# Patient Record
Sex: Female | Born: 1955 | Race: White | Hispanic: No | Marital: Married | Smoking: Former smoker
Health system: Southern US, Community
[De-identification: ages and names within clinical notes are randomized; demographics above are authoritative.]

## PROBLEM LIST (undated history)

## (undated) DIAGNOSIS — L280 Lichen simplex chronicus: Secondary | ICD-10-CM

## (undated) DIAGNOSIS — M2550 Pain in unspecified joint: Secondary | ICD-10-CM

## (undated) DIAGNOSIS — R1310 Dysphagia, unspecified: Secondary | ICD-10-CM

## (undated) DIAGNOSIS — K5900 Constipation, unspecified: Secondary | ICD-10-CM

## (undated) DIAGNOSIS — F419 Anxiety disorder, unspecified: Secondary | ICD-10-CM

## (undated) DIAGNOSIS — K219 Gastro-esophageal reflux disease without esophagitis: Secondary | ICD-10-CM

## (undated) DIAGNOSIS — C801 Malignant (primary) neoplasm, unspecified: Secondary | ICD-10-CM

## (undated) DIAGNOSIS — T8859XA Other complications of anesthesia, initial encounter: Secondary | ICD-10-CM

## (undated) DIAGNOSIS — B009 Herpesviral infection, unspecified: Secondary | ICD-10-CM

## (undated) DIAGNOSIS — E781 Pure hyperglyceridemia: Secondary | ICD-10-CM

## (undated) DIAGNOSIS — R079 Chest pain, unspecified: Secondary | ICD-10-CM

## (undated) DIAGNOSIS — E7800 Pure hypercholesterolemia, unspecified: Secondary | ICD-10-CM

## (undated) DIAGNOSIS — E559 Vitamin D deficiency, unspecified: Secondary | ICD-10-CM

## (undated) DIAGNOSIS — S62109A Fracture of unspecified carpal bone, unspecified wrist, initial encounter for closed fracture: Secondary | ICD-10-CM

## (undated) DIAGNOSIS — T4145XA Adverse effect of unspecified anesthetic, initial encounter: Secondary | ICD-10-CM

## (undated) DIAGNOSIS — G4730 Sleep apnea, unspecified: Secondary | ICD-10-CM

## (undated) HISTORY — DX: Anxiety disorder, unspecified: F41.9

## (undated) HISTORY — DX: Lichen simplex chronicus: L28.0

## (undated) HISTORY — DX: Sleep apnea, unspecified: G47.30

## (undated) HISTORY — PX: DILATION AND CURETTAGE OF UTERUS: SHX78

## (undated) HISTORY — DX: Dysphagia, unspecified: R13.10

## (undated) HISTORY — DX: Chest pain, unspecified: R07.9

## (undated) HISTORY — DX: Constipation, unspecified: K59.00

## (undated) HISTORY — DX: Pure hyperglyceridemia: E78.1

## (undated) HISTORY — DX: Fracture of unspecified carpal bone, unspecified wrist, initial encounter for closed fracture: S62.109A

## (undated) HISTORY — DX: Vitamin D deficiency, unspecified: E55.9

## (undated) HISTORY — PX: FACIAL COSMETIC SURGERY: SHX629

## (undated) HISTORY — DX: Pain in unspecified joint: M25.50

## (undated) HISTORY — DX: Herpesviral infection, unspecified: B00.9

---

## 1981-08-04 HISTORY — PX: TUBAL LIGATION: SHX77

## 1999-09-11 ENCOUNTER — Other Ambulatory Visit: Admission: RE | Admit: 1999-09-11 | Discharge: 1999-09-11 | Payer: Self-pay | Admitting: Obstetrics and Gynecology

## 2000-11-03 ENCOUNTER — Other Ambulatory Visit: Admission: RE | Admit: 2000-11-03 | Discharge: 2000-11-03 | Payer: Self-pay | Admitting: Obstetrics and Gynecology

## 2001-11-04 ENCOUNTER — Other Ambulatory Visit: Admission: RE | Admit: 2001-11-04 | Discharge: 2001-11-04 | Payer: Self-pay | Admitting: Obstetrics and Gynecology

## 2003-02-09 ENCOUNTER — Other Ambulatory Visit: Admission: RE | Admit: 2003-02-09 | Discharge: 2003-02-09 | Payer: Self-pay | Admitting: Obstetrics and Gynecology

## 2003-03-28 ENCOUNTER — Ambulatory Visit (HOSPITAL_COMMUNITY): Admission: RE | Admit: 2003-03-28 | Discharge: 2003-03-28 | Payer: Self-pay | Admitting: Obstetrics and Gynecology

## 2003-03-28 ENCOUNTER — Encounter: Payer: Self-pay | Admitting: Obstetrics and Gynecology

## 2003-04-19 ENCOUNTER — Encounter: Payer: Self-pay | Admitting: Obstetrics and Gynecology

## 2003-04-19 ENCOUNTER — Encounter: Admission: RE | Admit: 2003-04-19 | Discharge: 2003-04-19 | Payer: Self-pay | Admitting: Obstetrics and Gynecology

## 2004-04-30 ENCOUNTER — Ambulatory Visit (HOSPITAL_COMMUNITY): Admission: RE | Admit: 2004-04-30 | Discharge: 2004-04-30 | Payer: Self-pay | Admitting: Obstetrics and Gynecology

## 2004-09-11 ENCOUNTER — Other Ambulatory Visit: Admission: RE | Admit: 2004-09-11 | Discharge: 2004-09-11 | Payer: Self-pay | Admitting: Obstetrics and Gynecology

## 2005-08-28 ENCOUNTER — Ambulatory Visit (HOSPITAL_COMMUNITY): Admission: RE | Admit: 2005-08-28 | Discharge: 2005-08-28 | Payer: Self-pay | Admitting: Obstetrics and Gynecology

## 2005-12-03 ENCOUNTER — Other Ambulatory Visit: Admission: RE | Admit: 2005-12-03 | Discharge: 2005-12-03 | Payer: Self-pay | Admitting: Obstetrics and Gynecology

## 2006-08-04 DIAGNOSIS — S62109A Fracture of unspecified carpal bone, unspecified wrist, initial encounter for closed fracture: Secondary | ICD-10-CM

## 2006-08-04 HISTORY — DX: Fracture of unspecified carpal bone, unspecified wrist, initial encounter for closed fracture: S62.109A

## 2006-08-04 HISTORY — PX: WRIST FRACTURE SURGERY: SHX121

## 2006-09-02 ENCOUNTER — Ambulatory Visit (HOSPITAL_COMMUNITY): Admission: RE | Admit: 2006-09-02 | Discharge: 2006-09-02 | Payer: Self-pay | Admitting: Obstetrics and Gynecology

## 2006-12-14 ENCOUNTER — Other Ambulatory Visit: Admission: RE | Admit: 2006-12-14 | Discharge: 2006-12-14 | Payer: Self-pay | Admitting: Obstetrics and Gynecology

## 2007-03-10 ENCOUNTER — Ambulatory Visit (HOSPITAL_BASED_OUTPATIENT_CLINIC_OR_DEPARTMENT_OTHER): Admission: RE | Admit: 2007-03-10 | Discharge: 2007-03-11 | Payer: Self-pay | Admitting: *Deleted

## 2007-09-08 ENCOUNTER — Ambulatory Visit (HOSPITAL_COMMUNITY): Admission: RE | Admit: 2007-09-08 | Discharge: 2007-09-08 | Payer: Self-pay | Admitting: Obstetrics and Gynecology

## 2007-12-16 ENCOUNTER — Other Ambulatory Visit: Admission: RE | Admit: 2007-12-16 | Discharge: 2007-12-16 | Payer: Self-pay | Admitting: Obstetrics and Gynecology

## 2008-09-12 ENCOUNTER — Ambulatory Visit (HOSPITAL_COMMUNITY): Admission: RE | Admit: 2008-09-12 | Discharge: 2008-09-12 | Payer: Self-pay | Admitting: Obstetrics and Gynecology

## 2008-12-18 ENCOUNTER — Other Ambulatory Visit: Admission: RE | Admit: 2008-12-18 | Discharge: 2008-12-18 | Payer: Self-pay | Admitting: Obstetrics and Gynecology

## 2009-10-11 ENCOUNTER — Ambulatory Visit (HOSPITAL_COMMUNITY): Admission: RE | Admit: 2009-10-11 | Discharge: 2009-10-11 | Payer: Self-pay | Admitting: Obstetrics and Gynecology

## 2009-11-27 ENCOUNTER — Other Ambulatory Visit: Admission: RE | Admit: 2009-11-27 | Discharge: 2009-11-27 | Payer: Self-pay | Admitting: Obstetrics and Gynecology

## 2010-01-31 ENCOUNTER — Emergency Department (HOSPITAL_COMMUNITY): Admission: EM | Admit: 2010-01-31 | Discharge: 2010-01-31 | Payer: Self-pay | Admitting: Emergency Medicine

## 2010-08-04 HISTORY — PX: HAND SURGERY: SHX662

## 2010-08-25 ENCOUNTER — Encounter: Payer: Self-pay | Admitting: Family Medicine

## 2010-10-20 LAB — CBC
HCT: 42.6 % (ref 36.0–46.0)
Hemoglobin: 14.8 g/dL (ref 12.0–15.0)
MCH: 33.5 pg (ref 26.0–34.0)
MCV: 96.4 fL (ref 78.0–100.0)
RBC: 4.42 MIL/uL (ref 3.87–5.11)
RDW: 12.3 % (ref 11.5–15.5)
WBC: 7.5 10*3/uL (ref 4.0–10.5)

## 2010-10-20 LAB — DIFFERENTIAL
Basophils Relative: 1 % (ref 0–1)
Eosinophils Absolute: 0.1 10*3/uL (ref 0.0–0.7)
Lymphocytes Relative: 30 % (ref 12–46)
Monocytes Relative: 6 % (ref 3–12)
Neutro Abs: 4.6 10*3/uL (ref 1.7–7.7)
Neutrophils Relative %: 62 % (ref 43–77)

## 2010-10-20 LAB — POCT I-STAT, CHEM 8
BUN: 21 mg/dL (ref 6–23)
Glucose, Bld: 93 mg/dL (ref 70–99)
HCT: 44 % (ref 36.0–46.0)
Potassium: 3.7 mEq/L (ref 3.5–5.1)
Sodium: 139 mEq/L (ref 135–145)

## 2010-10-20 LAB — POCT CARDIAC MARKERS: CKMB, poc: <1 ng/mL — ABNORMAL LOW (ref 1.0–8.0)

## 2010-10-30 ENCOUNTER — Other Ambulatory Visit: Payer: Self-pay | Admitting: Obstetrics & Gynecology

## 2010-10-30 DIAGNOSIS — Z1231 Encounter for screening mammogram for malignant neoplasm of breast: Secondary | ICD-10-CM

## 2010-11-11 ENCOUNTER — Ambulatory Visit (HOSPITAL_COMMUNITY): Payer: 59

## 2010-11-12 ENCOUNTER — Ambulatory Visit (HOSPITAL_COMMUNITY): Payer: 59

## 2010-11-14 ENCOUNTER — Ambulatory Visit (HOSPITAL_COMMUNITY): Payer: 59

## 2010-11-25 ENCOUNTER — Ambulatory Visit (HOSPITAL_COMMUNITY)
Admission: RE | Admit: 2010-11-25 | Discharge: 2010-11-25 | Disposition: A | Discharge status: Home / Self Care | Payer: 59 | Source: Ambulatory Visit | Attending: Obstetrics & Gynecology | Admitting: Obstetrics & Gynecology

## 2010-11-25 DIAGNOSIS — Z1231 Encounter for screening mammogram for malignant neoplasm of breast: Secondary | ICD-10-CM | POA: Insufficient documentation

## 2010-11-27 ENCOUNTER — Other Ambulatory Visit: Payer: Self-pay | Admitting: Obstetrics & Gynecology

## 2010-11-27 DIAGNOSIS — R928 Other abnormal and inconclusive findings on diagnostic imaging of breast: Secondary | ICD-10-CM

## 2010-12-02 ENCOUNTER — Other Ambulatory Visit: Payer: Self-pay | Admitting: Obstetrics & Gynecology

## 2010-12-02 ENCOUNTER — Ambulatory Visit
Admission: RE | Admit: 2010-12-02 | Discharge: 2010-12-02 | Disposition: A | Discharge status: Home / Self Care | Payer: 59 | Source: Ambulatory Visit | Attending: Obstetrics & Gynecology | Admitting: Obstetrics & Gynecology

## 2010-12-02 DIAGNOSIS — R928 Other abnormal and inconclusive findings on diagnostic imaging of breast: Secondary | ICD-10-CM

## 2010-12-03 HISTORY — PX: BREAST BIOPSY: SHX20

## 2010-12-10 ENCOUNTER — Ambulatory Visit
Admission: RE | Admit: 2010-12-10 | Discharge: 2010-12-10 | Disposition: A | Discharge status: Home / Self Care | Payer: 59 | Source: Ambulatory Visit | Attending: Obstetrics & Gynecology | Admitting: Obstetrics & Gynecology

## 2010-12-10 ENCOUNTER — Other Ambulatory Visit: Payer: Self-pay | Admitting: Diagnostic Radiology

## 2010-12-10 DIAGNOSIS — R928 Other abnormal and inconclusive findings on diagnostic imaging of breast: Secondary | ICD-10-CM

## 2010-12-17 NOTE — Op Note (Signed)
NAMELISIA, WESTBAY NO.:  [id number]   MEDICAL RECORD NO.:  [id number]          PATIENT TYPE:  AMB   LOCATION:  DSC                          FACILITY:  [facility]   PHYSICIAN:  Tennis Must Meyerdierks, M.D.DATE OF BIRTH:  07-03-1956   DATE OF PROCEDURE:  03/10/2007  DATE OF DISCHARGE:                               OPERATIVE REPORT   PREOPERATIVE DIAGNOSES:  Comminuted intra-articular fracture right  distal radius.   POSTOPERATIVE DIAGNOSES:  Comminuted intra-articular fracture right  distal radius.   PROCEDURE:  Open reduction internal fixation right distal radius  fracture.   SURGEON:  Lowell Bouton, M.D.   ANESTHESIA:  General.   OPERATIVE FINDINGS:  The patient had a distal radius fracture that  involved a large radial styloid fragment that was rotated and stepped  off at the joint.  There was a longitudinal split along the radial  styloid to the shaft.  There was also an ulnar fragment that was intra-  articular that did not appear to really be a dye punch type of injury  but was oblique and extended just proximal to the distal radial ulnar  joint.   PROCEDURE:  Under general anesthesia with a tourniquet on the right arm,  the right hand was prepped and draped in the usual fashion and after  exsanguinating the limb the tourniquet was inflated to 250 mmHg.  10  pounds of traction was applied to finger traps on the index, long and  ring fingers over the end of the table.  A longitudinal incision was  made over the volar aspect of the FCR tendon. Blunt dissection was  carried through the subcutaneous tissues and bleeding points were  coagulated.  Blunt dissection was carried down to the FCR tendon and the  sheath was incised longitudinally.  The tendon was then retracted  ulnarly and the floor of the sheath was incised.  A Freer elevator was  used to free up the FPL muscle belly and it was retracted ulnarly.  The  pronator quadratus was  released at its attachment to the radius  longitudinally with a knife.  It was elevated off the volar radius with  a Therapist, nutritional.  The fracture fragments were then identified and were  spread and cleaned of any tissue.  The joint was opened volarly to  evaluate the articular surface.  There was a fragment of articular  cartilage that was removed.  The ulnar piece was reduced first and was  elevated and near anatomically reduced.  A 45 K-wire was placed from the  radial side of the radius across obliquely into that ulnar fragment to  hold it.  The radial styloid pieces were then elevated and the joint was  reduced.  A 45 K-wire was placed across the radial styloid holding that  fragment reduced to the ulnar side of the bone.  A DVR plate was then  used using a standard size and an attempt was made to put the wider  tipped plate in but neither plate would capture the ulnar fragment.  Both plates would capture the radial fragment so  the narrower T was  used.  The slotted hole screw was inserted first using a 2.5 drill bit  and a 3.5-mm screw.  The plate was adjusted proximally so that the  distal pegs were not placed in the joint.  Four proximal pegs were then  inserted using one partially threaded one and the rest smooth.  These  were drilled with a 2.0 drill bit.  The distal two pegs were then  inserted.  X-rays showed good position of the fracture and the radial  styloid pin was removed.  The remaining two screws proximally were  placed using 3.5-mm screws.  X-rays showed good position and the pegs  were not in the joint.  The fracture was then fluoroscopied and was felt  to be stable.  The K-wire in the ulnar fragment was left protruding from  the radial side of the forearm and bent over.  The wound was irrigated  copiously with saline.  The pronator quadratus was repaired with 4-0  Vicryl, the  subcutaneous tissue was closed with 4-0  Vicryl over a TLSO drain. The skin was closed with  a 3-0 subcuticular  Prolene.  Half percent Marcaine was placed in the skin edges for pain  control.  Steri-Strips were applied followed by sterile dressings and a  volar wrist splint.  The patient tolerated the procedure well and went  to the recovery room awake in stable in good condition.      Lowell Bouton, M.D.  Electronically Signed     EMM/MEDQ  D:  03/10/2007  T:  03/11/2007  Job:  [id number]

## 2011-05-19 LAB — POCT HEMOGLOBIN-HEMACUE: Hemoglobin: 16.7 — ABNORMAL HIGH

## 2011-11-04 ENCOUNTER — Other Ambulatory Visit: Payer: Self-pay | Admitting: Obstetrics & Gynecology

## 2011-11-04 DIAGNOSIS — Z1231 Encounter for screening mammogram for malignant neoplasm of breast: Secondary | ICD-10-CM

## 2011-12-11 ENCOUNTER — Ambulatory Visit (HOSPITAL_COMMUNITY)
Admission: RE | Admit: 2011-12-11 | Discharge: 2011-12-11 | Disposition: A | Discharge status: Home / Self Care | Payer: 59 | Source: Ambulatory Visit | Attending: Obstetrics & Gynecology | Admitting: Obstetrics & Gynecology

## 2011-12-11 DIAGNOSIS — Z1231 Encounter for screening mammogram for malignant neoplasm of breast: Secondary | ICD-10-CM

## 2012-07-29 ENCOUNTER — Other Ambulatory Visit: Payer: Self-pay

## 2013-03-16 ENCOUNTER — Other Ambulatory Visit: Payer: Self-pay | Admitting: Obstetrics & Gynecology

## 2013-03-16 DIAGNOSIS — Z1231 Encounter for screening mammogram for malignant neoplasm of breast: Secondary | ICD-10-CM

## 2013-03-23 ENCOUNTER — Other Ambulatory Visit: Payer: Self-pay | Admitting: Obstetrics & Gynecology

## 2013-03-23 NOTE — Telephone Encounter (Signed)
Pt's last aex was on 03/25/2012. Next aex scheduled for 04/07/2013. Pt was given Rx for Estring 2 mg q 3 months, no refills, per chart. Pt states she has been using Estring for the entire last year. Ok to refill x1? Chart on your door.

## 2013-03-23 NOTE — Telephone Encounter (Signed)
Rx approved

## 2013-03-29 ENCOUNTER — Ambulatory Visit (HOSPITAL_COMMUNITY)
Admission: RE | Admit: 2013-03-29 | Discharge: 2013-03-29 | Disposition: A | Discharge status: Home / Self Care | Payer: 59 | Source: Ambulatory Visit | Attending: Obstetrics & Gynecology | Admitting: Obstetrics & Gynecology

## 2013-03-29 DIAGNOSIS — Z1231 Encounter for screening mammogram for malignant neoplasm of breast: Secondary | ICD-10-CM

## 2013-04-06 ENCOUNTER — Encounter: Payer: Self-pay | Admitting: Obstetrics & Gynecology

## 2013-04-07 ENCOUNTER — Encounter: Payer: Self-pay | Admitting: Obstetrics & Gynecology

## 2013-04-07 ENCOUNTER — Ambulatory Visit (INDEPENDENT_AMBULATORY_CARE_PROVIDER_SITE_OTHER): Payer: 59 | Admitting: Obstetrics & Gynecology

## 2013-04-07 VITALS — BP 110/66 | HR 68 | Ht 70.0 in | Wt 198.0 lb

## 2013-04-07 DIAGNOSIS — Z01419 Encounter for gynecological examination (general) (routine) without abnormal findings: Secondary | ICD-10-CM

## 2013-04-07 DIAGNOSIS — Z0000 Encounter for general adult medical examination without abnormal findings: Secondary | ICD-10-CM

## 2013-04-07 LAB — HEMOGLOBIN, FINGERSTICK: Hemoglobin, fingerstick: 15 g/dL (ref 12.0–16.0)

## 2013-04-07 MED ORDER — ESTRADIOL 2 MG VA RING: VAGINAL_RING | VAGINAL | Status: DC

## 2013-04-07 NOTE — Patient Instructions (Signed)

## 2013-04-07 NOTE — Progress Notes (Signed)
Patient ID: Meredith Shannon, female   DOB: [birth year], 57 y.o.   MRN: [id number]  57 y.o. [id number] MarriedCaucasianF here for annual exam.  Moved this year--downsized.  Sold house very quickly.  New home is in [place].  Got a tetanus shot this year.  Son had a baby girl this year--Lillian Danelle Earthly.  4 months old.  No LMP recorded. Patient is postmenopausal.          Sexually active: yes  The current method of family planning is post menopausal status.    Exercising: yes  Home exercise routine includes cardio and eliptical. Smoker:  no  Health Maintenance: Pap:  03/25/12, WNL, neg HR HPV History of abnormal Pap:  no MMG:  03/30/13, BI-Rads 1: negative Colonoscopy:  12/24/07, repeat in 10 years, Dr. Sherin Quarry BMD:   12/20/08, normal ([facility]) TDaP:  2014 Screening Labs: PCP, Hb today: 15.0, Urine today: trace RBC, ph 5.0    Past Medical History  Diagnosis Date  . HSV-1 infection   . Lichen simplex chronicus   . Broken wrist 2008    Past Surgical History  Procedure Laterality Date  . Tubal ligation  1983  . Dilation and curettage of uterus    . Facial cosmetic surgery    . Breast biopsy  5/12    fibrocystic changes    Current Outpatient Prescriptions  Medication Sig Dispense Refill  . acyclovir (ZOVIRAX) 800 MG tablet Take 800 mg by mouth 2 (two) times daily.      . Ascorbic Acid (VITAMIN C PO) Take by mouth.      . Calcium Carbonate-Vit D-Min (CALCIUM 1200 PO) Take by mouth.      . Cholecalciferol (VITAMIN D) 2000 UNITS CAPS Take by mouth.      . ESTRING 2 MG vaginal ring INSERT 1 RING VAGINALLY EVERY 3 MONTHS  1 each  1  . KRILL OIL PO Take by mouth.      . rosuvastatin (CRESTOR) 20 MG tablet Take 20 mg by mouth daily.      . Testosterone Propionate 2 % CREA Place onto the skin.      Marland Kitchen VITAMIN E PO Take by mouth.       No current facility-administered medications for this visit.    No family history on file.  ROS:  Pertinent items are noted in HPI.  Otherwise, a  comprehensive ROS was negative.  Exam:   BP 110/66  Pulse 68  Ht 5\' 10"  (1.778 m)  Wt 198 lb (89.812 kg)  BMI 28.41 kg/m2  Weight change: +11lbs   Height: 5\' 10"  (177.8 cm)  Ht Readings from Last 3 Encounters:  04/07/13 5\' 10"  (1.778 m)    General appearance: alert, cooperative and appears stated age Head: Normocephalic, without obvious abnormality, atraumatic Neck: no adenopathy, supple, symmetrical, trachea midline and thyroid normal to inspection and palpation Lungs: clear to auscultation bilaterally Breasts: normal appearance, no masses or tenderness Heart: regular rate and rhythm Abdomen: soft, non-tender; bowel sounds normal; no masses,  no organomegaly Extremities: extremities normal, atraumatic, no cyanosis or edema Skin: Skin color, texture, turgor normal. No rashes or lesions Lymph nodes: Cervical, supraclavicular, and axillary nodes normal. No abnormal inguinal nodes palpated Neurologic: Grossly normal   Pelvic: External genitalia:  no lesions              Urethra:  normal appearing urethra with no masses, tenderness or lesions              Bartholins and  Skenes: normal                 Vagina: normal appearing vagina with normal color and discharge, no lesions              Cervix: no lesions              Pap taken: yes Bimanual Exam:  Uterus:  normal size, contour, position, consistency, mobility, non-tender              Adnexa: normal adnexa and no mass, fullness, tenderness               Rectovaginal: Confirms               Anus:  normal sphincter tone, no lesions  A:  Well Woman with normal exam PMP, no HRT Vaginal atrophy, using Estring but not sure it is any better than just lubricant Decreased libido H/O HSV 1 Elevated lipids Lichen simplex chronicus  P:   Mammogram yearly pap smear with neg HR HPV 03/25/12 Labs with PCP Pt will call if needs Acyclovir and/or Temovate rx Estring 2mg  pv every 3 months return annually or prn  An After Visit Summary  was printed and given to the patient.

## 2013-06-09 ENCOUNTER — Other Ambulatory Visit: Payer: Self-pay

## 2014-02-27 ENCOUNTER — Other Ambulatory Visit: Payer: Self-pay | Admitting: Obstetrics & Gynecology

## 2014-02-27 DIAGNOSIS — Z1231 Encounter for screening mammogram for malignant neoplasm of breast: Secondary | ICD-10-CM

## 2014-04-06 ENCOUNTER — Ambulatory Visit (HOSPITAL_COMMUNITY)
Admission: RE | Admit: 2014-04-06 | Discharge: 2014-04-06 | Disposition: A | Discharge status: Home / Self Care | Payer: 59 | Source: Ambulatory Visit | Attending: Obstetrics & Gynecology | Admitting: Obstetrics & Gynecology

## 2014-04-06 DIAGNOSIS — Z1231 Encounter for screening mammogram for malignant neoplasm of breast: Secondary | ICD-10-CM | POA: Insufficient documentation

## 2014-04-14 ENCOUNTER — Telehealth: Payer: Self-pay | Admitting: Obstetrics & Gynecology

## 2014-04-14 ENCOUNTER — Encounter: Payer: Self-pay | Admitting: Obstetrics & Gynecology

## 2014-04-14 ENCOUNTER — Ambulatory Visit (INDEPENDENT_AMBULATORY_CARE_PROVIDER_SITE_OTHER): Payer: 59 | Admitting: Obstetrics & Gynecology

## 2014-04-14 VITALS — BP 124/80 | HR 68 | Resp 16 | Ht 70.0 in | Wt 202.0 lb

## 2014-04-14 DIAGNOSIS — Z124 Encounter for screening for malignant neoplasm of cervix: Secondary | ICD-10-CM

## 2014-04-14 DIAGNOSIS — Z0000 Encounter for general adult medical examination without abnormal findings: Secondary | ICD-10-CM

## 2014-04-14 DIAGNOSIS — E785 Hyperlipidemia, unspecified: Secondary | ICD-10-CM

## 2014-04-14 DIAGNOSIS — Z01419 Encounter for gynecological examination (general) (routine) without abnormal findings: Secondary | ICD-10-CM

## 2014-04-14 LAB — POCT URINALYSIS DIPSTICK
Bilirubin, UA: NEGATIVE
GLUCOSE UA: NEGATIVE
Ketones, UA: NEGATIVE
NITRITE UA: NEGATIVE
PH UA: 5
Protein, UA: NEGATIVE
RBC UA: NEGATIVE
UROBILINOGEN UA: NEGATIVE

## 2014-04-14 LAB — HEMOGLOBIN, FINGERSTICK: Hemoglobin, fingerstick: 15.2 g/dL (ref 12.0–16.0)

## 2014-04-14 NOTE — Telephone Encounter (Signed)
lmtcb re: moving aex from 10:45 to 3:30 today due to a scheduling conflict on Dr. Ammie Ferrier schedule.

## 2014-04-14 NOTE — Progress Notes (Signed)
58 y.o. [id number] MarriedCaucasianF here for annual exam.  No vaginal bleeding.  Updated tdap last year due to granddaughter--Lillian.  PCP:  [facility].  Last labs were about six months ago.  Cholesterol was mildly elevated even on Crstor.      Patient's last menstrual period was 08/04/2006.          Sexually active: Yes.    The current method of family planning is post menopausal status.    Exercising: Yes.    walking 1-2 x per week and eliptical 1-2 x per week Smoker:  Former smoker  Health Maintenance: Pap:  03/25/12 WNL/negative HR HPV History of abnormal Pap:  no MMG:  04/06/14 3D-normal Colonoscopy:  12/24/07-repeat in 10 years, Dr. Sammuel Cooper BMD:   12/20/08-normal TDaP:  4/14 Screening Labs: none, Hb today: 15.2, Urine today: WBC-trace   reports that she quit smoking about 29 years ago. Her smoking use included Cigarettes. She smoked 0.00 packs per day. She has never used smokeless tobacco. She reports that she drinks about 1 - 1.5 ounces of alcohol per week. She reports that she does not use illicit drugs.  Past Medical History  Diagnosis Date  . HSV-1 infection   . Lichen simplex chronicus   . Broken wrist 2008    Past Surgical History  Procedure Laterality Date  . Tubal ligation  1983  . Dilation and curettage of uterus    . Facial cosmetic surgery    . Breast biopsy  5/12    fibrocystic changes    Current Outpatient Prescriptions  Medication Sig Dispense Refill  . acyclovir (ZOVIRAX) 800 MG tablet Take 800 mg by mouth 2 (two) times daily.      . Ascorbic Acid (VITAMIN C PO) Take by mouth.      Marland Kitchen buPROPion (WELLBUTRIN SR) 150 MG 12 hr tablet 150 mg 2 (two) times daily.       . Calcium Carbonate-Vit D-Min (CALCIUM 1200 PO) Take by mouth.      . Cholecalciferol (VITAMIN D) 2000 UNITS CAPS Take by mouth.      Marland Kitchen KRILL OIL PO Take by mouth.      . rosuvastatin (CRESTOR) 20 MG tablet Take 20 mg by mouth daily.      Marland Kitchen VITAMIN E PO Take by mouth.       No  current facility-administered medications for this visit.    History reviewed. No pertinent family history.  ROS:  Pertinent items are noted in HPI.  Otherwise, a comprehensive ROS was negative.  Exam:   BP 124/80  Pulse 68  Resp 16  Ht 5\' 10"  (1.778 m)  Wt 202 lb (91.627 kg)  BMI 28.98 kg/m2  LMP 08/04/2006  Weight change:  +4#Height: 5\' 10"  (177.8 cm)  Ht Readings from Last 3 Encounters:  04/14/14 5\' 10"  (1.778 m)  04/07/13 5\' 10"  (1.778 m)    General appearance: alert, cooperative and appears stated age Head: Normocephalic, without obvious abnormality, atraumatic Neck: no adenopathy, supple, symmetrical, trachea midline and thyroid normal to inspection and palpation Lungs: clear to auscultation bilaterally Breasts: normal appearance, no masses or tenderness Heart: regular rate and rhythm Abdomen: soft, non-tender; bowel sounds normal; no masses,  no organomegaly Extremities: extremities normal, atraumatic, no cyanosis or edema Skin: Skin color, texture, turgor normal. No rashes or lesions Lymph nodes: Cervical, supraclavicular, and axillary nodes normal. No abnormal inguinal nodes palpated Neurologic: Grossly normal   Pelvic: External genitalia:  no lesions  Urethra:  normal appearing urethra with no masses, tenderness or lesions              Bartholins and Skenes: normal                 Vagina: normal appearing vagina with normal color and discharge, no lesions              Cervix: no lesions              Pap taken: Yes.   Bimanual Exam:  Uterus:  normal size, contour, position, consistency, mobility, non-tender              Adnexa: normal adnexa and no mass, fullness, tenderness               Rectovaginal: Confirms               Anus:  normal sphincter tone, no lesions  A:  Well Woman with normal exam  PMP, no HRT  Vaginal atrophy Decreased libido  H/O HSV 1  Elevated lipids  Lichen simplex chronicus   P: Mammogram yearly  pap smear with neg HR  HPV 03/25/12.  Pap only today. Labs with PCP  Pt will call if needs Acyclovir rx  return annually or prn  An After Visit Summary was printed and given to the patient.

## 2014-04-18 LAB — IPS PAP TEST WITH REFLEX TO HPV

## 2014-06-05 ENCOUNTER — Encounter: Payer: Self-pay | Admitting: Obstetrics & Gynecology

## 2015-05-21 ENCOUNTER — Other Ambulatory Visit: Payer: Self-pay

## 2015-05-21 DIAGNOSIS — Z1231 Encounter for screening mammogram for malignant neoplasm of breast: Secondary | ICD-10-CM

## 2015-06-01 ENCOUNTER — Ambulatory Visit (INDEPENDENT_AMBULATORY_CARE_PROVIDER_SITE_OTHER): Payer: 59 | Admitting: Obstetrics & Gynecology

## 2015-06-01 ENCOUNTER — Encounter: Payer: Self-pay | Admitting: Obstetrics & Gynecology

## 2015-06-01 VITALS — BP 118/80 | HR 80 | Resp 14 | Ht 70.0 in | Wt 201.0 lb

## 2015-06-01 DIAGNOSIS — Z0000 Encounter for general adult medical examination without abnormal findings: Secondary | ICD-10-CM

## 2015-06-01 DIAGNOSIS — Z01419 Encounter for gynecological examination (general) (routine) without abnormal findings: Secondary | ICD-10-CM

## 2015-06-01 DIAGNOSIS — E785 Hyperlipidemia, unspecified: Secondary | ICD-10-CM | POA: Diagnosis not present

## 2015-06-01 DIAGNOSIS — L280 Lichen simplex chronicus: Secondary | ICD-10-CM | POA: Diagnosis not present

## 2015-06-01 MED ORDER — OSPEMIFENE 60 MG PO TABS: 1.0000 | ORAL_TABLET | Freq: Every day | ORAL | Status: DC

## 2015-06-01 NOTE — Progress Notes (Signed)
Patient ID: Meredith Shannon, female   DOB: 25-Oct-1955, 59 y.o.   MRN: [id number]   59 y.o. [id number] MarriedCaucasianF here for annual exam.  Doing well.  Denies vaginal bleeding.  Having vaginal dryness.  Used Estring and this didn't help much.  Has used estrace cream but she would forget this from time to time.    Going to [place] next week.  Patient's last menstrual period was 08/04/2006.          Sexually active: Yes.    The current method of family planning is post menopausal status.    Exercising: Yes.    walking, ellipitical  Smoker:  no  Health Maintenance: Pap:  04-14-14 WNL, neg Pap with neg HR HPV 2013 History of abnormal Pap:  no MMG:  04-06-14 WNL Colonoscopy:  12/24/2007, Dr. Sammuel Cooper BMD: 2/09,1.2/-0.9 TDaP:  2014  Screening Labs: PCP does fasting labs    reports that she quit smoking about 30 years ago. Her smoking use included Cigarettes. She has never used smokeless tobacco. She reports that she drinks about 0.6 - 1.2 oz of alcohol per week. She reports that she does not use illicit drugs.  Past Medical History  Diagnosis Date  . HSV-1 infection   . Lichen simplex chronicus   . Broken wrist 2008    Past Surgical History  Procedure Laterality Date  . Tubal ligation  1983  . Dilation and curettage of uterus    . Facial cosmetic surgery    . Breast biopsy  5/12    fibrocystic changes    Current Outpatient Prescriptions  Medication Sig Dispense Refill  . acyclovir (ZOVIRAX) 800 MG tablet Take 800 mg by mouth 2 (two) times daily.    . Ascorbic Acid (VITAMIN C PO) Take by mouth.    Marland Kitchen buPROPion (WELLBUTRIN SR) 150 MG 12 hr tablet 150 mg 2 (two) times daily.     . Calcium Carbonate-Vit D-Min (CALCIUM 1200 PO) Take by mouth.    . Cholecalciferol (VITAMIN D) 2000 UNITS CAPS Take by mouth.    Marland Kitchen KRILL OIL PO Take by mouth.    . rosuvastatin (CRESTOR) 20 MG tablet Take 20 mg by mouth daily.    Marland Kitchen VITAMIN E PO Take by mouth.     No current facility-administered  medications for this visit.    Family History  Problem Relation Age of Onset  . Osteoporosis Mother     ROS:  Pertinent items are noted in HPI.  Otherwise, a comprehensive ROS was negative.  Exam:   BP 118/80 mmHg  Pulse 80  Resp 14  Ht 5\' 10"  (1.778 m)  Wt 201 lb (91.173 kg)  BMI 28.84 kg/m2  LMP 08/04/2006  Weight change: -2#  Height: 5\' 10"  (177.8 cm)  Ht Readings from Last 3 Encounters:  06/01/15 5\' 10"  (1.778 m)  04/14/14 5\' 10"  (1.778 m)  04/07/13 5\' 10"  (1.778 m)    General appearance: alert, cooperative and appears stated age Head: Normocephalic, without obvious abnormality, atraumatic Neck: no adenopathy, supple, symmetrical, trachea midline and thyroid normal to inspection and palpation Lungs: clear to auscultation bilaterally Breasts: normal appearance, no masses or tenderness Heart: regular rate and rhythm Abdomen: soft, non-tender; bowel sounds normal; no masses,  no organomegaly Extremities: extremities normal, atraumatic, no cyanosis or edema Skin: Skin color, texture, turgor normal. No rashes or lesions Lymph nodes: Cervical, supraclavicular, and axillary nodes normal. No abnormal inguinal nodes palpated Neurologic: Grossly normal   Pelvic: External genitalia:  no lesions  Urethra:  normal appearing urethra with no masses, tenderness or lesions              Bartholins and Skenes: normal                 Vagina: normal appearing vagina with normal color and discharge, no lesions, atrophic changes              Cervix: no lesions              Pap taken: No. Bimanual Exam:  Uterus:  normal size, contour, position, consistency, mobility, non-tender              Adnexa: normal adnexa and no mass, fullness, tenderness               Rectovaginal: Confirms               Anus:  normal sphincter tone, no lesions  Chaperone was present for exam.  A:  Well Woman with normal exam  PMP, no HRT  Vaginal atrophy Decreased libido  H/O HSV 1   Elevated lipids  Lichen simplex chronicus (biopsy done 2008)  P: Mammogram yearly  pap smear with neg HR HPV 03/25/12. Pap only today. Labs with PCP  Checking Vit D today Trial of osphena 60 mg daily.  Rx to pharmacy and savings card given. return annually or prn

## 2015-06-02 LAB — VITAMIN D 25 HYDROXY (VIT D DEFICIENCY, FRACTURES): Vit D, 25-Hydroxy: 36 ng/mL (ref 30–100)

## 2015-06-04 ENCOUNTER — Telehealth: Payer: Self-pay

## 2015-06-04 NOTE — Telephone Encounter (Signed)
Lmtcb//kn 

## 2015-06-04 NOTE — Telephone Encounter (Signed)
Return call

## 2015-06-04 NOTE — Telephone Encounter (Signed)
Patient notified of results.//kn 

## 2015-06-04 NOTE — Telephone Encounter (Signed)
-----   Message from Megan Salon, MD sent at 06/02/2015  4:18 AM EDT ----- Inform pt this is very close to normal.  She should keep taking the Vit D.  Can you confirm the dosage she is taking?  Thanks.

## 2015-06-20 ENCOUNTER — Ambulatory Visit
Admission: RE | Admit: 2015-06-20 | Discharge: 2015-06-20 | Disposition: A | Discharge status: Home / Self Care | Payer: 59 | Source: Ambulatory Visit

## 2015-06-20 DIAGNOSIS — Z1231 Encounter for screening mammogram for malignant neoplasm of breast: Secondary | ICD-10-CM

## 2015-07-02 ENCOUNTER — Emergency Department (HOSPITAL_BASED_OUTPATIENT_CLINIC_OR_DEPARTMENT_OTHER)
Admission: EM | Admit: 2015-07-02 | Discharge: 2015-07-02 | Disposition: A | Discharge status: Home / Self Care | Payer: 59 | Attending: Emergency Medicine | Admitting: Emergency Medicine

## 2015-07-02 ENCOUNTER — Emergency Department (HOSPITAL_BASED_OUTPATIENT_CLINIC_OR_DEPARTMENT_OTHER): Payer: 59

## 2015-07-02 ENCOUNTER — Encounter (HOSPITAL_BASED_OUTPATIENT_CLINIC_OR_DEPARTMENT_OTHER): Payer: Self-pay

## 2015-07-02 DIAGNOSIS — R0602 Shortness of breath: Secondary | ICD-10-CM | POA: Insufficient documentation

## 2015-07-02 DIAGNOSIS — Z8781 Personal history of (healed) traumatic fracture: Secondary | ICD-10-CM | POA: Insufficient documentation

## 2015-07-02 DIAGNOSIS — Z79899 Other long term (current) drug therapy: Secondary | ICD-10-CM | POA: Diagnosis not present

## 2015-07-02 DIAGNOSIS — E7800 Pure hypercholesterolemia, unspecified: Secondary | ICD-10-CM | POA: Insufficient documentation

## 2015-07-02 DIAGNOSIS — Z880 Allergy status to penicillin: Secondary | ICD-10-CM | POA: Insufficient documentation

## 2015-07-02 DIAGNOSIS — M549 Dorsalgia, unspecified: Secondary | ICD-10-CM | POA: Diagnosis not present

## 2015-07-02 DIAGNOSIS — Z8619 Personal history of other infectious and parasitic diseases: Secondary | ICD-10-CM | POA: Diagnosis not present

## 2015-07-02 DIAGNOSIS — Z87891 Personal history of nicotine dependence: Secondary | ICD-10-CM | POA: Diagnosis not present

## 2015-07-02 DIAGNOSIS — R51 Headache: Secondary | ICD-10-CM | POA: Diagnosis not present

## 2015-07-02 DIAGNOSIS — Z872 Personal history of diseases of the skin and subcutaneous tissue: Secondary | ICD-10-CM | POA: Diagnosis not present

## 2015-07-02 DIAGNOSIS — R079 Chest pain, unspecified: Secondary | ICD-10-CM | POA: Diagnosis present

## 2015-07-02 DIAGNOSIS — R110 Nausea: Secondary | ICD-10-CM | POA: Diagnosis not present

## 2015-07-02 HISTORY — DX: Pure hypercholesterolemia, unspecified: E78.00

## 2015-07-02 LAB — CBC WITH DIFFERENTIAL/PLATELET
Basophils Absolute: 0 10*3/uL (ref 0.0–0.1)
Basophils Relative: 0 %
EOS PCT: 2 %
Eosinophils Absolute: 0.1 10*3/uL (ref 0.0–0.7)
HEMATOCRIT: 42.8 % (ref 36.0–46.0)
Hemoglobin: 14.8 g/dL (ref 12.0–15.0)
LYMPHS ABS: 1.9 10*3/uL (ref 0.7–4.0)
LYMPHS PCT: 28 %
MCH: 32.2 pg (ref 26.0–34.0)
MCHC: 34.6 g/dL (ref 30.0–36.0)
MCV: 93 fL (ref 78.0–100.0)
MONO ABS: 0.5 10*3/uL (ref 0.1–1.0)
MONOS PCT: 8 %
NEUTROS ABS: 4.2 10*3/uL (ref 1.7–7.7)
Neutrophils Relative %: 62 %
Platelets: 199 10*3/uL (ref 150–400)
RBC: 4.6 MIL/uL (ref 3.87–5.11)
RDW: 11.9 % (ref 11.5–15.5)
WBC: 6.7 10*3/uL (ref 4.0–10.5)

## 2015-07-02 LAB — COMPREHENSIVE METABOLIC PANEL
ALT: 19 U/L (ref 14–54)
AST: 19 U/L (ref 15–41)
Albumin: 4 g/dL (ref 3.5–5.0)
Alkaline Phosphatase: 70 U/L (ref 38–126)
Anion gap: 7 (ref 5–15)
BILIRUBIN TOTAL: 0.4 mg/dL (ref 0.3–1.2)
BUN: 20 mg/dL (ref 6–20)
CO2: 27 mmol/L (ref 22–32)
Calcium: 9.4 mg/dL (ref 8.9–10.3)
Chloride: 104 mmol/L (ref 101–111)
Creatinine, Ser: 0.9 mg/dL (ref 0.44–1.00)
Glucose, Bld: 100 mg/dL — ABNORMAL HIGH (ref 65–99)
POTASSIUM: 4 mmol/L (ref 3.5–5.1)
Sodium: 138 mmol/L (ref 135–145)
TOTAL PROTEIN: 7.1 g/dL (ref 6.5–8.1)

## 2015-07-02 LAB — D-DIMER, QUANTITATIVE (NOT AT ARMC): D DIMER QUANT: 0.32 ug{FEU}/mL (ref 0.00–0.50)

## 2015-07-02 LAB — TROPONIN I: Troponin I: <0.03 ng/mL (ref <0.031)

## 2015-07-02 MED ORDER — SODIUM CHLORIDE 0.9 % IV SOLN: INTRAVENOUS | Status: DC

## 2015-07-02 MED ORDER — NITROGLYCERIN 0.4 MG SL SUBL
0.4000 mg | SUBLINGUAL_TABLET | SUBLINGUAL | Status: DC | PRN
  Filled 2015-07-02: qty 1

## 2015-07-02 MED ORDER — ONDANSETRON HCL 4 MG/2ML IJ SOLN
4.0000 mg | Freq: Once | INTRAMUSCULAR | Status: AC
Start: 2015-07-02 — End: 2015-07-02
  Administered 2015-07-02: 4 mg via INTRAVENOUS
  Filled 2015-07-02: qty 2

## 2015-07-02 MED ORDER — SODIUM CHLORIDE 0.9 % IV BOLUS (SEPSIS)
500.0000 mL | Freq: Once | INTRAVENOUS | Status: AC
  Administered 2015-07-02: 500 mL via INTRAVENOUS

## 2015-07-02 MED ORDER — ASPIRIN 81 MG PO CHEW
324.0000 mg | CHEWABLE_TABLET | Freq: Once | ORAL | Status: AC
  Administered 2015-07-02: 324 mg via ORAL
  Filled 2015-07-02: qty 4

## 2015-07-02 NOTE — Discharge Instructions (Signed)
Aspirin and Your Heart  Aspirin is a medicine that affects the way blood clots. Aspirin can be used to help reduce the risk of blood clots, heart attacks, and other heart-related problems.  SHOULD I TAKE ASPIRIN? Your health care provider will help you determine whether it is safe and beneficial for you to take aspirin daily. Taking aspirin daily may be beneficial if you:  Have had a heart attack or chest pain.  Have undergone open heart surgery such as coronary artery bypass surgery (CABG).  Have had coronary angioplasty.  Have experienced a stroke or transient ischemic attack (TIA).  Have peripheral vascular disease (PVD).  Have chronic heart rhythm problems such as atrial fibrillation. ARE THERE ANY RISKS OF TAKING ASPIRIN DAILY? Daily use of aspirin can increase your risk of side effects. Some of these include:  Bleeding. Bleeding problems can be minor or serious. An example of a minor problem is a cut that does not stop bleeding. An example of a more serious problem is stomach bleeding or bleeding into the brain. Your risk of bleeding is increased if you are also taking non-steroidal anti-inflammatory medicine (NSAIDs).  Increased bruising.  Upset stomach.  An allergic reaction. People who have nasal polyps have an increased risk of developing an aspirin allergy. WHAT ARE SOME GUIDELINES I SHOULD FOLLOW WHEN TAKING ASPIRIN?   Take aspirin only as directed by your health care provider. Make sure you understand how much you should take and what form you should take. The two forms of aspirin are:  Non-enteric-coated. This type of aspirin does not have a coating and is absorbed quickly. Non-enteric-coated aspirin is usually recommended for people with chest pain. This type of aspirin also comes in a chewable form.  Enteric-coated. This type of aspirin has a special coating that releases the medicine very slowly. Enteric-coated aspirin causes less stomach upset than non-enteric-coated  aspirin. This type of aspirin should not be chewed or crushed.  Drink alcohol in moderation. Drinking alcohol increases your risk of bleeding. WHEN SHOULD I SEEK MEDICAL CARE?   You have unusual bleeding or bruising.  You have stomach pain.  You have an allergic reaction. Symptoms of an allergic reaction include:  Hives.  Itchy skin.  Swelling of the lips, tongue, or face.  You have ringing in your ears. WHEN SHOULD I SEEK IMMEDIATE MEDICAL CARE?   Your bowel movements are bloody, dark red, or black in color.  You vomit or cough up blood.  You have blood in your urine.  You cough, wheeze, or feel short of breath. If you have any of the following symptoms, this is an emergency. Do not wait to see if the pain will go away. Get medical help at once. Call your local emergency services (911 in the [place] Do not drive yourself to the hospital.  You have severe chest pain, especially if the pain is crushing or pressure-like and spreads to the arms, back, neck, or jaw.  You have stroke-like symptoms, such as:   Loss of vision.   Difficulty talking.   Numbness or weakness on one side of your body.   Numbness or weakness in your arm or leg.   Not thinking clearly or feeling confused.    Call your primary doctor and cardiology for follow-up. Return for any new or worse symptoms at all. Start taking a baby aspirin a day.    This information is not intended to replace advice given to you by your health care provider. Make sure you  discuss any questions you have with your health care provider.      Document Released: 07/03/2008 Document Revised: 08/11/2014 Document Reviewed: 10/26/2013 Elsevier Interactive Patient Education [employer].

## 2015-07-02 NOTE — ED Notes (Signed)
Patient stable and ambulatory. Patient verbalizes understanding of discharge instructions and follow-up. 

## 2015-07-02 NOTE — ED Notes (Signed)
Patient requesting to wait on the Nitro.  She states she feels like she is improving and doesn't want to take it at this time.

## 2015-07-02 NOTE — ED Provider Notes (Addendum)
CSN: [id number]     Arrival date & time 07/02/15  1432 History  By signing my name below, I, [name], attest that this documentation has been prepared under the direction and in the presence of Fredia Sorrow, MD. Electronically Signed: [name], ED Scribe. 07/02/2015. 3:47 PM.   Chief Complaint  Patient presents with  . Chest Pain   The history is provided by the patient. No language interpreter was used.   HPI Comments: Meredith Shannon is a 59 y.o. female, with a PMhx of HLD, who presents to the Emergency Department complaining of sudden onset, constant, centralized chest pain onset 6 hours ago, that gradually worsened throughout the day today, and that she describes as a pressure that radiates to her back. Her CP is currently a 2/10 but was a 6/10 at its worst. Pt reports similar chest pain several nights ago that radiated up her anterior neck but that resolved spontaneously until onset of her symptoms today. She associates SOB, a slight headache, and nausea but no vomiting. The pt took a prevacid at onset of her symptoms without relief. She notes a history of intermittent CP that is similar in nature to her current chest pain . She was evaluated by a cardiologist 5 years ago for this past CP when she received a stress test. No h/o cardiac catheterization.   Past Medical History  Diagnosis Date  . HSV-1 infection   . Lichen simplex chronicus   . Broken wrist 2008  . High cholesterol    Past Surgical History  Procedure Laterality Date  . Tubal ligation  1983  . Dilation and curettage of uterus    . Facial cosmetic surgery    . Breast biopsy  5/12    fibrocystic changes   Family History  Problem Relation Age of Onset  . Osteoporosis Mother    Social History  Substance Use Topics  . Smoking status: Former Smoker    Types: Cigarettes    Quit date: 08/04/1984  . Smokeless tobacco: Never Used  . Alcohol Use: 0.6 - 1.2 oz/week    1-2 Standard drinks or equivalent per  week   OB History    Gravida Para Term Preterm AB TAB SAB Ectopic Multiple Living   2 1 1  1 1    1      Review of Systems  Constitutional: Negative for fever and chills.  HENT: Negative for congestion, rhinorrhea and sore throat.   Eyes: Negative for photophobia and visual disturbance.  Respiratory: Positive for shortness of breath. Negative for cough.   Cardiovascular: Positive for chest pain. Negative for leg swelling.  Gastrointestinal: Positive for nausea. Negative for vomiting, abdominal pain and diarrhea.  Genitourinary: Negative for dysuria and hematuria.  Musculoskeletal: Positive for back pain. Negative for neck pain.  Skin: Negative for rash.  Neurological: Positive for headaches (mild).  Hematological: Does not bruise/bleed easily.  Psychiatric/Behavioral: Negative for confusion.   Allergies  Penicillins  Home Medications   Prior to Admission medications   Medication Sig Start Date End Date Taking? Authorizing Provider  acyclovir (ZOVIRAX) 800 MG tablet Take 800 mg by mouth 2 (two) times daily.   Yes Historical Provider, MD  Ascorbic Acid (VITAMIN C PO) Take by mouth.   Yes Historical Provider, MD  Calcium Carbonate-Vit D-Min (CALCIUM 1200 PO) Take by mouth.   Yes Historical Provider, MD  Cholecalciferol (VITAMIN D) 2000 UNITS CAPS Take by mouth.   Yes Historical Provider, MD  KRILL OIL PO Take by mouth.  Yes Historical Provider, MD  Ospemifene (OSPHENA) 60 MG TABS Take 1 tablet by mouth daily. 06/01/15  Yes Megan Salon, MD  rosuvastatin (CRESTOR) 20 MG tablet Take 20 mg by mouth daily.   Yes Historical Provider, MD  VITAMIN E PO Take by mouth.   Yes Historical Provider, MD  buPROPion (WELLBUTRIN SR) 150 MG 12 hr tablet 150 mg 2 (two) times daily.  04/07/14   Historical Provider, MD   BP 172/103 mmHg  Pulse 87  Temp(Src) 98 F (36.7 C) (Oral)  Resp 20  Ht 5\' 10"  (1.778 m)  Wt 200 lb (90.719 kg)  BMI 28.70 kg/m2  SpO2 100%  LMP 08/04/2006 Physical Exam   Constitutional: She is oriented to person, place, and time. She appears well-developed and well-nourished. No distress.  HENT:  Head: Normocephalic and atraumatic.  Mouth/Throat: Oropharynx is clear and moist. No oropharyngeal exudate.  Eyes: Conjunctivae and EOM are normal. Pupils are equal, round, and reactive to light.  Neck: Normal range of motion.  Cardiovascular: Normal rate, regular rhythm and normal heart sounds.   No murmur heard. Pulmonary/Chest: Effort normal and breath sounds normal. No respiratory distress. She has no wheezes. She exhibits no tenderness.  Abdominal: Soft. Bowel sounds are normal. She exhibits no distension. There is no tenderness.  Musculoskeletal: Normal range of motion. She exhibits no edema.  No swelling in B/L ankles.   Neurological: She is alert and oriented to person, place, and time.  Skin: Skin is warm and dry.  Psychiatric: She has a normal mood and affect. Judgment normal.  Nursing note and vitals reviewed.   ED Course  Procedures  DIAGNOSTIC STUDIES: Oxygen Saturation is 100% on RA, normal by my interpretation.    COORDINATION OF CARE: 3:30 PM Discussed treatment plan which includes to order cardiac workup with pt. Pt acknowledges and agrees to plan.    Labs Review Labs Reviewed  COMPREHENSIVE METABOLIC PANEL - Abnormal; Notable for the following:    Glucose, Bld 100 (*)    All other components within normal limits  CBC WITH DIFFERENTIAL/PLATELET  TROPONIN I  D-DIMER, QUANTITATIVE (NOT AT [facility])  TROPONIN I   Results for orders placed or performed during the hospital encounter of 07/02/15  CBC with Differential/Platelet  Result Value Ref Range   WBC 6.7 4.0 - 10.5 K/uL   RBC 4.60 3.87 - 5.11 MIL/uL   Hemoglobin 14.8 12.0 - 15.0 g/dL   HCT 42.8 36.0 - 46.0 %   MCV 93.0 78.0 - 100.0 fL   MCH 32.2 26.0 - 34.0 pg   MCHC 34.6 30.0 - 36.0 g/dL   RDW 11.9 11.5 - 15.5 %   Platelets 199 150 - 400 K/uL   Neutrophils Relative % 62 %    Neutro Abs 4.2 1.7 - 7.7 K/uL   Lymphocytes Relative 28 %   Lymphs Abs 1.9 0.7 - 4.0 K/uL   Monocytes Relative 8 %   Monocytes Absolute 0.5 0.1 - 1.0 K/uL   Eosinophils Relative 2 %   Eosinophils Absolute 0.1 0.0 - 0.7 K/uL   Basophils Relative 0 %   Basophils Absolute 0.0 0.0 - 0.1 K/uL  Comprehensive metabolic panel  Result Value Ref Range   Sodium 138 135 - 145 mmol/L   Potassium 4.0 3.5 - 5.1 mmol/L   Chloride 104 101 - 111 mmol/L   CO2 27 22 - 32 mmol/L   Glucose, Bld 100 (H) 65 - 99 mg/dL   BUN 20 6 - 20 mg/dL  Creatinine, Ser 0.90 0.44 - 1.00 mg/dL   Calcium 9.4 8.9 - 10.3 mg/dL   Total Protein 7.1 6.5 - 8.1 g/dL   Albumin 4.0 3.5 - 5.0 g/dL   AST 19 15 - 41 U/L   ALT 19 14 - 54 U/L   Alkaline Phosphatase 70 38 - 126 U/L   Total Bilirubin 0.4 0.3 - 1.2 mg/dL   GFR calc non Af Amer >60 >60 mL/min   GFR calc Af Amer >60 >60 mL/min   Anion gap 7 5 - 15  Troponin I  Result Value Ref Range   Troponin I <0.03 <0.031 ng/mL  D-dimer, quantitative (not at [facility])  Result Value Ref Range   D-Dimer, Quant 0.32 0.00 - 0.50 ug/mL-FEU  Troponin I  Result Value Ref Range   Troponin I <0.03 <0.031 ng/mL     Imaging Review Dg Chest 2 View  07/02/2015  CLINICAL DATA:  Chest pain EXAM: CHEST  2 VIEW COMPARISON:  01/31/2010 FINDINGS: Normal heart size and mediastinal contours. No acute infiltrate or edema. No effusion or pneumothorax. No acute osseous findings. IMPRESSION: Negative chest. Electronically Signed   By: Monte Fantasia M.D.   On: 07/02/2015 15:05   I have personally reviewed and evaluated these images and lab results as part of my medical decision-making.   EKG Interpretation   Date/Time:  Monday July 02 2015 14:39:15 EST Ventricular Rate:  92 PR Interval:  128 QRS Duration: 76 QT Interval:  362 QTC Calculation: 447 R Axis:   40 Text Interpretation:  Normal sinus rhythm Possible Left atrial enlargement  Borderline ECG No significant change since last  tracing Confirmed by  Ami Thornsberry  MD, Kenzley Ke [phone]) on 07/02/2015 6:54:24 PM      MDM   Final diagnoses:  Chest pain, unspecified chest pain type    Patient with onset of chest pain at 10 this morning. Been constant since that time. Patient's had some intermittent chest pain yesterday. Patient does chest pain was starting resolved before she was given the aspirin. I'll in the aspirin there was complete resolution. Patient never had any nitroglycerin. Patient is now pain-free. Patient's had 2 troponins that are negative. EKG without acute changes chest x-ray was negative d-dimer negative for any concerns for pulmonary embolus.  Patient will need to follow-up with cardiology and will need to return for any new or worse chest pain. Highly unlikely based on the duration of the pain and the 2 negative troponins that this is either unstable angina or are any evidence of an acute cardiac event. Patient will be started on a baby aspirin a day with referral to cardiology. Chest x-ray also negative for pneumonia pneumothorax or any evidence of pulmonary edema.  I personally performed the services described in this documentation, which was scribed in my presence. The recorded information has been reviewed and is accurate.      Fredia Sorrow, MD 07/02/15 [id number]  Fredia Sorrow, MD 07/02/15 [phone]

## 2015-07-02 NOTE — ED Notes (Signed)
Reports chest pain that started this am around 10 and has got worse throughout the day.  Reports "struggle to breathe".  Reports pain radiates into back and neck.

## 2016-05-27 ENCOUNTER — Other Ambulatory Visit: Payer: Self-pay | Admitting: Obstetrics & Gynecology

## 2016-05-27 ENCOUNTER — Other Ambulatory Visit: Payer: Self-pay | Admitting: Family

## 2016-05-27 DIAGNOSIS — Z1231 Encounter for screening mammogram for malignant neoplasm of breast: Secondary | ICD-10-CM

## 2016-06-03 ENCOUNTER — Encounter: Payer: Self-pay | Admitting: Obstetrics & Gynecology

## 2016-06-03 ENCOUNTER — Ambulatory Visit (INDEPENDENT_AMBULATORY_CARE_PROVIDER_SITE_OTHER): Payer: 59 | Admitting: Obstetrics & Gynecology

## 2016-06-03 VITALS — BP 100/70 | HR 90 | Resp 14 | Ht 70.25 in | Wt 206.8 lb

## 2016-06-03 DIAGNOSIS — Z205 Contact with and (suspected) exposure to viral hepatitis: Secondary | ICD-10-CM

## 2016-06-03 DIAGNOSIS — Z124 Encounter for screening for malignant neoplasm of cervix: Secondary | ICD-10-CM | POA: Diagnosis not present

## 2016-06-03 DIAGNOSIS — N941 Unspecified dyspareunia: Secondary | ICD-10-CM | POA: Diagnosis not present

## 2016-06-03 DIAGNOSIS — R1030 Lower abdominal pain, unspecified: Secondary | ICD-10-CM | POA: Diagnosis not present

## 2016-06-03 DIAGNOSIS — R0789 Other chest pain: Secondary | ICD-10-CM | POA: Diagnosis not present

## 2016-06-03 DIAGNOSIS — Z01419 Encounter for gynecological examination (general) (routine) without abnormal findings: Secondary | ICD-10-CM

## 2016-06-03 DIAGNOSIS — R102 Pelvic and perineal pain: Secondary | ICD-10-CM

## 2016-06-03 DIAGNOSIS — Z0000 Encounter for general adult medical examination without abnormal findings: Secondary | ICD-10-CM | POA: Diagnosis not present

## 2016-06-03 LAB — COMPREHENSIVE METABOLIC PANEL
ALBUMIN: 4.1 g/dL (ref 3.6–5.1)
ALK PHOS: 63 U/L (ref 33–130)
ALT: 17 U/L (ref 6–29)
AST: 17 U/L (ref 10–35)
BILIRUBIN TOTAL: 0.5 mg/dL (ref 0.2–1.2)
BUN: 15 mg/dL (ref 7–25)
CO2: 26 mmol/L (ref 20–31)
CREATININE: 0.86 mg/dL (ref 0.50–1.05)
Calcium: 9.4 mg/dL (ref 8.6–10.4)
Chloride: 103 mmol/L (ref 98–110)
Glucose, Bld: 70 mg/dL (ref 65–99)
Potassium: 4.1 mmol/L (ref 3.5–5.3)
SODIUM: 141 mmol/L (ref 135–146)
TOTAL PROTEIN: 6.7 g/dL (ref 6.1–8.1)

## 2016-06-03 LAB — POCT URINALYSIS DIPSTICK
BILIRUBIN UA: NEGATIVE
GLUCOSE UA: NEGATIVE
KETONES UA: NEGATIVE
LEUKOCYTES UA: NEGATIVE
NITRITE UA: NEGATIVE
Protein, UA: NEGATIVE
Urobilinogen, UA: NEGATIVE
pH, UA: 5

## 2016-06-03 LAB — TSH: TSH: 0.79 m[IU]/L

## 2016-06-03 NOTE — Progress Notes (Signed)
Patient scheduled for PUS while in office. Patient scheduled for 07/03/16 at 3:30; Dr. Sabra Heck at 4:00pm. Patient is agreeable to date and time.

## 2016-06-03 NOTE — Addendum Note (Signed)
Addended by: Megan Salon on: 06/03/2016 11:26 AM   Modules accepted: Orders

## 2016-06-03 NOTE — Progress Notes (Signed)
60 y.o. [id number] MarriedCaucasianF here for annual exam.  Reports she is doing well.  Review of records show she was in the ER in Nov.  Pt reports this occurs from time to time and it is "a pain that starts in the mid chest and then goes up through the right side of her neck".  She was evaluated for an MI and the evaluation was negative.  Cardiology consultation was recommended.  She has not gone to see anyone yet.    Did try the Osphena last year.  Feels it didn't do anything for her.    Concerns about intermittent abdominal pain and friend who recently died of ovarian cancer at age 61.  PCP:  Emmie Niemann.  Pt had cholesterol checked.  She doesn't think she checked anything else.    Patient's last menstrual period was 08/04/2006.          Sexually active: No.  The current method of family planning is post menopausal status.    Exercising: No.  The patient does not participate in regular exercise at present. Smoker:  no  Health Maintenance: Pap:  04/14/14 negative, 2013 HR HPV negative  History of abnormal Pap:  no MMG:  06/21/15 BIRADS 1 negative  Colonoscopy:  2009 Dr. Sammuel Cooper  BMD:   2009  TDaP:  2014  Pneumonia vaccine(s):  never Zostavax:   never Hep C testing: drawn today  Screening Labs: done today, Hb today: PCP, Urine today: trace RBC's   reports that she quit smoking about 31 years ago. Her smoking use included Cigarettes. She has never used smokeless tobacco. She reports that she drinks about 0.6 - 1.2 oz of alcohol per week . She reports that she does not use drugs.  Past Medical History:  Diagnosis Date  . Broken wrist 2008  . High cholesterol   . HSV-1 infection   . Lichen simplex chronicus     Past Surgical History:  Procedure Laterality Date  . BREAST BIOPSY  5/12   fibrocystic changes  . DILATION AND CURETTAGE OF UTERUS    . FACIAL COSMETIC SURGERY    . TUBAL LIGATION  1983    Current Outpatient Prescriptions  Medication Sig Dispense Refill  . acyclovir  (ZOVIRAX) 800 MG tablet Take 800 mg by mouth 2 (two) times daily.    . Calcium Carbonate-Vit D-Min (CALCIUM 1200 PO) Take by mouth.    . Cholecalciferol (VITAMIN D) 2000 UNITS CAPS Take by mouth.    Marland Kitchen KRILL OIL PO Take by mouth.    Marland Kitchen MAGNESIUM PO Take by mouth.    . rosuvastatin (CRESTOR) 20 MG tablet Take 20 mg by mouth daily.    Marland Kitchen VITAMIN E PO Take by mouth.     No current facility-administered medications for this visit.     Family History  Problem Relation Age of Onset  . Osteoporosis Mother     ROS:  Pertinent items are noted in HPI.  Otherwise, a comprehensive ROS was negative.  Exam:   BP 100/70 (BP Location: Right Arm, Patient Position: Sitting, Cuff Size: Large)   Pulse 90   Resp 14   Ht 5' 10.25" (1.784 m)   Wt 206 lb 12.8 oz (93.8 kg)   LMP 08/04/2006   BMI 29.46 kg/m   Weight change: @WEIGHTCHANGE @ Height:   Height: 5' 10.25" (178.4 cm)  Ht Readings from Last 3 Encounters:  06/03/16 5' 10.25" (1.784 m)  07/02/15 5\' 10"  (1.778 m)  06/01/15 5\' 10"  (1.778 m)  General appearance: alert, cooperative and appears stated age Head: Normocephalic, without obvious abnormality, atraumatic Neck: no adenopathy, supple, symmetrical, trachea midline and thyroid normal to inspection and palpation Lungs: clear to auscultation bilaterally Breasts: normal appearance, no masses or tenderness Heart: regular rate and rhythm Abdomen: soft, non-tender; bowel sounds normal; no masses,  no organomegaly Extremities: extremities normal, atraumatic, no cyanosis or edema Skin: Skin color, texture, turgor normal. No rashes or lesions Lymph nodes: Cervical, supraclavicular, and axillary nodes normal. No abnormal inguinal nodes palpated Neurologic: Grossly normal   Pelvic: External genitalia:  no lesions              Urethra:  normal appearing urethra with no masses, tenderness or lesions              Bartholins and Skenes: normal                 Vagina: normal appearing vagina with  normal color and discharge, no lesions              Cervix: no lesions              Pap taken: Yes.   Bimanual Exam:  Uterus:  normal size, contour, position, consistency, mobility, non-tender              Adnexa: normal adnexa and no mass, fullness, tenderness               Rectovaginal: Confirms               Anus:  normal sphincter tone, no lesions  Chaperone was present for exam.  A:  Well Woman with normal exam  PMP, no HRT  Vaginal atrophy.  Has tried Estring, vaginal estrogen cream, Osphena without success.   Decreased libido  H/O HSV 1  Elevated lipids  Lichen simplex chronicus (biopsy done 2008) Mild mid abdominal pain with friend who recently died from ovarian cancer Chest pain and ER admission in November  P: Mammogram yearly  pap smear with neg HR HPV 03/25/12. Pap and HR HPV today Vit D, CMP, CBC, TSH, and Hep C today Has Acyclovir rx.  Doesn't need RF. Will try Vit E capsules vaginally. Referral to cardiology will be made PUS will be scheduled Return annually or prn

## 2016-06-04 LAB — HEPATITIS C ANTIBODY: HCV Ab: NEGATIVE

## 2016-06-05 LAB — IPS PAP TEST WITH HPV

## 2016-06-06 ENCOUNTER — Telehealth: Payer: Self-pay | Admitting: Obstetrics & Gynecology

## 2016-06-06 NOTE — Telephone Encounter (Signed)
Patient called requesting status of prescription for vitamin E. States it was supposed to be sent to a compound pharmacy.  Patient agreeable to return call and asks to leave detailed voicemail is she is unable to answer.

## 2016-06-06 NOTE — Telephone Encounter (Signed)
I called this to [place].  I just called back and they are working on this and will call her when ready.  I confirmed the information.  I also sent lab results from earlier this week to give to her as well.  Thanks.

## 2016-06-06 NOTE — Telephone Encounter (Signed)
Call to patient to review changes made to a referral for cardiology. Left voicemail to return call.

## 2016-06-06 NOTE — Telephone Encounter (Signed)
Spoke with patient. Patient is content with current appointment in January 2018.

## 2016-06-06 NOTE — Telephone Encounter (Signed)
Dr. Sabra Heck, please advise on patient message? Last AEX 06/03/16   P: Mammogram yearly  pap smear with neg HR HPV 03/25/12. Pap and HR HPV today Vit D, CMP, CBC, TSH, and Hep C today Has Acyclovir rx.  Doesn't need RF. Will try Vit E capsules vaginally. Referral to cardiology will be made PUS will be scheduled Return annually or prn

## 2016-06-06 NOTE — Telephone Encounter (Signed)
Spoke with patient, advised as seen below per Dr. Ammie Ferrier request. Reviewed lab results. Patient verbalizes understanding and is agreeable.   Routing to provider for final review. Patient is agreeable to disposition. Will close encounter.

## 2016-06-18 ENCOUNTER — Ambulatory Visit: Payer: 59 | Admitting: Cardiology

## 2016-06-24 ENCOUNTER — Ambulatory Visit (HOSPITAL_BASED_OUTPATIENT_CLINIC_OR_DEPARTMENT_OTHER)
Admission: RE | Admit: 2016-06-24 | Discharge: 2016-06-24 | Disposition: A | Discharge status: Home / Self Care | Payer: BLUE CROSS/BLUE SHIELD | Source: Ambulatory Visit | Attending: Family | Admitting: Family

## 2016-06-24 DIAGNOSIS — Z1231 Encounter for screening mammogram for malignant neoplasm of breast: Secondary | ICD-10-CM | POA: Insufficient documentation

## 2016-07-01 ENCOUNTER — Ambulatory Visit: Payer: 59 | Admitting: Physician Assistant

## 2016-07-02 ENCOUNTER — Ambulatory Visit: Payer: 59 | Admitting: Cardiovascular Disease

## 2016-07-03 ENCOUNTER — Other Ambulatory Visit: Payer: Self-pay | Admitting: Obstetrics & Gynecology

## 2016-07-03 ENCOUNTER — Other Ambulatory Visit: Payer: Self-pay

## 2016-07-07 ENCOUNTER — Telehealth: Payer: Self-pay | Admitting: Obstetrics & Gynecology

## 2016-07-07 NOTE — Telephone Encounter (Signed)
Patient canceled her upcoming PUS appointment 07/10/16. Patient would like to reschedule.

## 2016-07-10 ENCOUNTER — Other Ambulatory Visit: Payer: 59 | Admitting: Obstetrics & Gynecology

## 2016-07-10 ENCOUNTER — Other Ambulatory Visit: Payer: 59

## 2016-08-04 DIAGNOSIS — R079 Chest pain, unspecified: Secondary | ICD-10-CM

## 2016-08-04 HISTORY — DX: Chest pain, unspecified: R07.9

## 2016-08-06 ENCOUNTER — Telehealth: Payer: Self-pay | Admitting: Obstetrics & Gynecology

## 2016-08-06 NOTE — Telephone Encounter (Signed)
Patient calling to reschedule an ultrasound she had to cancel in December.

## 2016-08-07 ENCOUNTER — Other Ambulatory Visit: Payer: Self-pay | Admitting: *Deleted

## 2016-08-07 DIAGNOSIS — R102 Pelvic and perineal pain: Secondary | ICD-10-CM

## 2016-08-07 DIAGNOSIS — R1030 Lower abdominal pain, unspecified: Secondary | ICD-10-CM

## 2016-08-07 NOTE — Telephone Encounter (Signed)
Spoke with patient, rescheduled ultrasound appointment. Patient rescheduled 08/21/16 with Dr Sabra Heck. Patient was offered earlier appointment date, but declined.  Patient aware of date, time and cancellation policy. No further questions.   Routing to Dr Sabra Heck for review.

## 2016-08-21 ENCOUNTER — Other Ambulatory Visit: Payer: 59 | Admitting: Obstetrics & Gynecology

## 2016-08-21 ENCOUNTER — Other Ambulatory Visit: Payer: 59

## 2016-08-26 ENCOUNTER — Encounter: Payer: Self-pay | Admitting: Cardiology

## 2016-08-26 ENCOUNTER — Ambulatory Visit (INDEPENDENT_AMBULATORY_CARE_PROVIDER_SITE_OTHER): Payer: BLUE CROSS/BLUE SHIELD | Admitting: Cardiology

## 2016-08-26 VITALS — BP 146/89 | HR 86 | Ht 70.5 in | Wt 213.0 lb

## 2016-08-26 DIAGNOSIS — Z01818 Encounter for other preprocedural examination: Secondary | ICD-10-CM | POA: Diagnosis not present

## 2016-08-26 DIAGNOSIS — E782 Mixed hyperlipidemia: Secondary | ICD-10-CM | POA: Diagnosis not present

## 2016-08-26 DIAGNOSIS — R079 Chest pain, unspecified: Secondary | ICD-10-CM | POA: Insufficient documentation

## 2016-08-26 NOTE — Assessment & Plan Note (Addendum)
She is on statin currently. She does say that her triglyceride levels are usually high, and has not noted any benefit from fish oil, Krill oil etc. Her PCP and OB/GYN are managing this. May need to consider the use of a fibrate in conjunction with statin.  Depending on the results of her coronary CTA, we may need to be more or less aggressive with risk factor modification.

## 2016-08-26 NOTE — Patient Instructions (Signed)
Schedule  Coronary CTA  CT Angiography (CTA), is a special type of CT scan that uses a computer to produce multi-dimensional views of major blood vessels and heart  throughout the body. In CT angiography, a contrast material is injected through an IV to help visualize the blood vessels    Your physician recommends that you schedule a follow-up appointment in 1-2 months  after CTA is completed with Dr Ellyn Hack.    If you need a refill on your cardiac medications before your next appointment, please call your pharmacy.

## 2016-08-26 NOTE — Progress Notes (Signed)
PCP: Evans, PA-C  OB-Gyn: Lyman Speller, MD  Clinic Note: Chief Complaint  Patient presents with  . Follow-up    HPI: Meredith Shannon is a 61 y.o. female with a PMH below who presents today for Evaluation of episodic chest pain. She was referred by her gynecologist.  Recent Hospitalizations: None  Studies Reviewed: None  Interval History: Meredith Shannon presents here today at the request of her gynecologist for intermittent episodes of substernal chest pain. She says that every so often (less than once a month) she will have an episode of chest discomfort that starts in the center of her chest and radiates up to the jaw that can sometimes take her breath away. It usually will last anywhere from 5-15 minutes. The last episode was about a month ago while watching TV. She did not notice that it occurs with any particular activity. Usually at rest. It also can occur at any. Particular time of the day without any association with awakening or fatigue/stress. She does not notice any baseline resting or exertional chest pain or dyspnea. She does indicate that she's not been very active of late and is more sedentary with her current job than usual. She notes that both her hands and feet feel cold all the time and that she oftentimes has to wear gloves and heavy socks.  She doesn't really notice any significant palpitations but has very rare episodes of feeling her heart temporarily stop which causes her to gasp for breath. It then goes away without any untoward aftermath.  She denies any PND, orthopnea or edema. Rapid irregular heartbeats, syncope/near syncope or TIA/amaurosis fugax.   No claudication.  ROS: A comprehensive was performed. Review of Systems  Constitutional: Negative for malaise/fatigue.  HENT: Negative for nosebleeds.   Respiratory: Negative for cough, sputum production and wheezing.   Cardiovascular: Positive for chest pain (per HPI). Negative for claudication.    Gastrointestinal: Negative.  Negative for heartburn, nausea and vomiting.  Genitourinary: Negative.   Musculoskeletal: Negative.   Neurological: Negative.   Endo/Heme/Allergies: Negative.   Psychiatric/Behavioral: Negative.   All other systems reviewed and are negative.   Past Medical History:  Diagnosis Date  . Broken wrist 2008  . High cholesterol    In total cholesterol and triglycerides.  Marland Kitchen HSV-1 infection   . Lichen simplex chronicus     Past Surgical History:  Procedure Laterality Date  . BREAST BIOPSY  5/12   fibrocystic changes  . DILATION AND CURETTAGE OF UTERUS    . FACIAL COSMETIC SURGERY    . TUBAL LIGATION  1983    Current Meds  Medication Sig  . acyclovir (ZOVIRAX) 800 MG tablet Take 800 mg by mouth 2 (two) times daily.  . Calcium Carbonate-Vit D-Min (CALCIUM 1200 PO) Take by mouth.  . Cholecalciferol (VITAMIN D) 2000 UNITS CAPS Take by mouth.  Marland Kitchen KRILL OIL PO Take by mouth.  Marland Kitchen MAGNESIUM PO Take by mouth.  . rosuvastatin (CRESTOR) 20 MG tablet Take 20 mg by mouth daily.  Marland Kitchen VITAMIN E PO Take by mouth.    Allergies  Allergen Reactions  . Penicillins Rash and Other (See Comments)    Tongue swelling    Social History   Social History  . Marital status: Married    Spouse name: N/A  . Number of children: 1  . Years of education: N/A   Social History Main Topics  . Smoking status: Former Smoker    Types: Cigarettes    Quit date: 08/04/1984  .  Smokeless tobacco: Never Used  . Alcohol use 0.6 - 1.2 oz/week    1 - 2 Standard drinks or equivalent per week  . Drug use: No  . Sexual activity: Not Currently    Partners: Male    Birth control/ protection: Post-menopausal   Other Topics Concern  . None   Social History Narrative  . None    family history includes Osteoporosis in her mother.  Wt Readings from Last 3 Encounters:  08/26/16 96.6 kg (213 lb)  06/03/16 93.8 kg (206 lb 12.8 oz)  07/02/15 90.7 kg (200 lb)    PHYSICAL EXAM BP (!)  146/89   Pulse 86   Ht 5' 10.5" (1.791 m)   Wt 96.6 kg (213 lb)   LMP 08/04/2006   SpO2 97%   BMI 30.13 kg/m  General appearance: alert, cooperative, appears stated age, no distress and Borderline obese Neck: no adenopathy, no carotid bruit and no JVD HEENT: Interior/AT, EOMI, MMM, anicteric sclera Lungs: clear to auscultation bilaterally, normal percussion bilaterally and non-labored Heart: regular rate and rhythm, S1, S2 normal, no murmur, click, rub or gallop; nondisplaced PMI Abdomen: soft, non-tender; bowel sounds normal; no masses,  no organomegaly; no HJR Extremities: extremities normal, atraumatic, no cyanosis, or edema Pulses: 2+ and symmetric;  Skin: mobility and turgor normal or  Neurologic: Mental status: Alert, oriented, thought content appropriate    Adult ECG Report  Rate: 86 ;  Rhythm: normal sinus rhythm and Normal axis, intervals and durations. Cannot exclude left atrial enlargement.;   Narrative Interpretation: Normal EKG   Other studies Reviewed: Additional studies/ records that were reviewed today include:  Recent Labs:  N/A  ASSESSMENT / PLAN: Problem List Items Addressed This Visit    Hyperlipidemia - Primary (Chronic)    She is on statin currently. She does say that her triglyceride levels are usually high, and has not noted any benefit from fish oil, Krill oil etc. Her PCP and OB/GYN are managing this. May need to consider the use of a fibrate in conjunction with statin.  Depending on the results of her coronary CTA, we may need to be more or less aggressive with risk factor modification.      Relevant Orders   EKG 12-Lead   CT CORONARY MORPH W/CTA COR W/SCORE W/CA W/CM &/OR WO/CM   Basic metabolic panel   Chest pain with moderate risk for cardiac etiology - concern for coronary spasm    Chest pain episodes usually occurring at rest lasting anywhere from 5 to 15 minutes that sound classically like anginal symptoms are more concerning for coronary  vasospasm. This is in conjunction with her sensation of her hands and feet being cold all the time consistent with peripheral vasospasm.  We discussed the possibility of when necessary nitroglycerin and even using low-dose amlodipine for her borderline blood pressure. However for now would like to clarify whether this is a cardiac issue or not.  Would like to exclude ischemic coronary disease or any focal lesions that could be related to coronary spasm.  Plan: Coronary CTA for anatomic evaluation.      Relevant Orders   EKG 12-Lead   CT CORONARY MORPH W/CTA COR W/SCORE W/CA W/CM &/OR WO/CM   Basic metabolic panel    Other Visit Diagnoses    Pre-op testing       Relevant Orders   Basic metabolic panel      Current medicines are reviewed at length with the patient today. (+/- concerns) None The following  changes have been made: None  Patient Instructions  Schedule  Coronary CTA  CT Angiography (CTA), is a special type of CT scan that uses a computer to produce multi-dimensional views of major blood vessels and heart  throughout the body. In CT angiography, a contrast material is injected through an IV to help visualize the blood vessels    Your physician recommends that you schedule a follow-up appointment in 1-2 months  after CTA is completed with Dr Ellyn Hack.    If you need a refill on your cardiac medications before your next appointment, please call your pharmacy.    Studies Ordered:   Orders Placed This Encounter  Procedures  . CT CORONARY MORPH W/CTA COR W/SCORE W/CA W/CM &/OR WO/CM  . Basic metabolic panel  . EKG 12-Lead      Glenetta Hew, M.D., M.S. Interventional Cardiologist   Pager # [phone] Phone # [phone] [address] [place], [place] [address]

## 2016-08-26 NOTE — Assessment & Plan Note (Signed)
Chest pain episodes usually occurring at rest lasting anywhere from 5 to 15 minutes that sound classically like anginal symptoms are more concerning for coronary vasospasm. This is in conjunction with her sensation of her hands and feet being cold all the time consistent with peripheral vasospasm.  We discussed the possibility of when necessary nitroglycerin and even using low-dose amlodipine for her borderline blood pressure. However for now would like to clarify whether this is a cardiac issue or not.  Would like to exclude ischemic coronary disease or any focal lesions that could be related to coronary spasm.  Plan: Coronary CTA for anatomic evaluation.

## 2016-08-28 ENCOUNTER — Ambulatory Visit (INDEPENDENT_AMBULATORY_CARE_PROVIDER_SITE_OTHER): Payer: BLUE CROSS/BLUE SHIELD | Admitting: Obstetrics & Gynecology

## 2016-08-28 ENCOUNTER — Other Ambulatory Visit: Payer: Self-pay | Admitting: Obstetrics & Gynecology

## 2016-08-28 ENCOUNTER — Ambulatory Visit (INDEPENDENT_AMBULATORY_CARE_PROVIDER_SITE_OTHER): Payer: BLUE CROSS/BLUE SHIELD

## 2016-08-28 VITALS — BP 136/80 | HR 80 | Resp 16 | Ht 70.5 in | Wt 215.0 lb

## 2016-08-28 DIAGNOSIS — N840 Polyp of corpus uteri: Secondary | ICD-10-CM

## 2016-08-28 DIAGNOSIS — R102 Pelvic and perineal pain: Secondary | ICD-10-CM

## 2016-08-28 DIAGNOSIS — R9389 Abnormal findings on diagnostic imaging of other specified body structures: Secondary | ICD-10-CM

## 2016-08-28 DIAGNOSIS — R938 Abnormal findings on diagnostic imaging of other specified body structures: Secondary | ICD-10-CM

## 2016-08-28 DIAGNOSIS — R1030 Lower abdominal pain, unspecified: Secondary | ICD-10-CM

## 2016-08-28 NOTE — Progress Notes (Signed)
61 y.o. G9P1011 Married [place] female here for a pelvic ultrasound due to mild mid abdominal pain and pt concerns for ovarian cancer due to recent friend's diagnosis.  She does occasionally have bloating.  Pt denies vaginal bleeding.  She did see Dr. Ellyn Hack, cardiology, two days ago.  Additional testing has been recommended but not scheduled due to some concerns regarding coronary spasm.  Pt not really concerned but she felt that way in late October when I saw her for her AEX and it's taken until late January for her to see anyone.  Patient's last menstrual period was 08/04/2006.  Contraception:  PMP  Technique:  Both transabdominal and transvaginal ultrasound examinations of the pelvis were performed. Transabdominal technique was performed for global imaging of the pelvis including uterus, ovaries, adnexal regions, and pelvic cul-de-sac.  It was necessary to proceed with endovaginal exam following the abdominal ultrasound transabdominal exam to visualize the endometrium and adnexa.  Color and duplex Doppler ultrasound was utilized to evaluate blood flow to the ovaries.   FINDINGS: Uterus: 5.7 x 3.9 x 2.7cm Endometrium: 7.73mm Adnexa:  Left: 1.2 x 0.7 x 0.5cm     Right:  1.3 x 1.2 x 0.5cm.  Ovaries both difficult to image due to overlying bowel and gas patterns however no cystic or solid masses/lesions noted on either side Cul de sac: no free fluid  Due to thickened endometrium which was hard to fully assess due to shadowing, a SHGM was recommended.  Pt very comfortable with proceeding.    SHSG:  After obtaining appropriate verbal consent from patient, the cervix was visualized using a speculum, and prepped with betadine.  A tenaculum  was not applied to the cervix.  Dilation of the cervix was not necessary. The catheter was passed into the uterus and sterile saline introduced, with the following findings: filling defect which fills the entire fundal region of the endometrial cavity measuring  14 x 45mm.  Findings reviewed with pt.  Due to size, hysteroscopic resection recommended.  Procedure discussed with patient.  Recovery and pain management discussed.  Risks discussed including but not limited to bleeding, rare risk of transfusion, infection, 1% risk of uterine perforation with risks of fluid deficit causing cardiac arrythmia, cerebral swelling and/or need to stop procedure early.  Fluid emboli and rare risk of death discussed.  DVT/PE, rare risk of risk of bowel/bladder/ureteral/vascular injury.  Patient aware if pathology abnormal she may need additional treatment.  All questions answered.    Physical Exam  Constitutional: She is oriented to person, place, and time. She appears well-developed and well-nourished.  Cardiovascular: Normal rate.   Pulmonary/Chest: Effort normal and breath sounds normal.  Neurological: She is alert and oriented to person, place, and time.  Psychiatric: She has a normal mood and affect.   Assessment:  PMP female with thickened endometrium and sonographic findings c/w endometrial polyp  Plan:  Hysteroscopy and polyp resection with D&C planned.  Procedure, risks, benefits, and alternatives (including following conservatively with ultrasounds and need for endometrial biopsy) discussed.  Pt is desirous of scheduling procedure.  Will wait until cardiac assessment has been completed.  ~30 minutes spent with patient >50% of time was in face to face discussion of above.

## 2016-08-29 NOTE — Addendum Note (Signed)
Addended by: Zebedee Iba on: 08/29/2016 07:38 AM   Modules accepted: Orders

## 2016-08-31 ENCOUNTER — Encounter: Payer: Self-pay | Admitting: Obstetrics & Gynecology

## 2016-09-02 ENCOUNTER — Telehealth: Payer: Self-pay | Admitting: *Deleted

## 2016-09-02 NOTE — Telephone Encounter (Signed)
Call to patient regarding surgical date options. Patient still waiting to hear from cardiology on appointment for cardiac testing. She is aware this will need to be completed and we will need note of cardiac clearance prior to surgery. Patient will call today to check on that procedure and will keep me updated on when these are completed so she can schedule.   Routing to provider for final review. Patient agreeable to disposition. Will close encounter.

## 2016-09-03 ENCOUNTER — Telehealth: Payer: Self-pay | Admitting: Obstetrics & Gynecology

## 2016-09-03 NOTE — Telephone Encounter (Signed)
Spoke with patient regarding benefit for surgical procedure. Patient understood and is agreeable. Patient understands this benefit information is for our services only, and does not include the hospital, anesthesiology, lab, etc. Patient states she is in the process of scheduling an appointment with her cardiologist, in order to obtained cardiac clearance for the recommended surgery. Patient advises, once she obtains clearance, she proceed with scheduling requirements.   Routing to [employer]

## 2016-09-09 ENCOUNTER — Encounter: Payer: Self-pay | Admitting: Cardiology

## 2016-09-17 NOTE — Telephone Encounter (Signed)
Per appointment desk in [facility] testing scheduled for 09-24-16.  Routing to provider for final review. Will close encounter.

## 2016-09-24 ENCOUNTER — Ambulatory Visit (HOSPITAL_COMMUNITY)
Admission: RE | Admit: 2016-09-24 | Discharge: 2016-09-24 | Disposition: A | Discharge status: Home / Self Care | Payer: BLUE CROSS/BLUE SHIELD | Source: Ambulatory Visit | Attending: Physician Assistant | Admitting: Physician Assistant

## 2016-09-24 ENCOUNTER — Encounter (HOSPITAL_COMMUNITY): Payer: Self-pay

## 2016-09-24 DIAGNOSIS — I700 Atherosclerosis of aorta: Secondary | ICD-10-CM | POA: Diagnosis not present

## 2016-09-24 DIAGNOSIS — I2510 Atherosclerotic heart disease of native coronary artery without angina pectoris: Secondary | ICD-10-CM | POA: Diagnosis not present

## 2016-09-24 DIAGNOSIS — E782 Mixed hyperlipidemia: Secondary | ICD-10-CM | POA: Insufficient documentation

## 2016-09-24 DIAGNOSIS — R079 Chest pain, unspecified: Secondary | ICD-10-CM | POA: Diagnosis present

## 2016-09-24 MED ORDER — NITROGLYCERIN 0.4 MG SL SUBL
SUBLINGUAL_TABLET | SUBLINGUAL | Status: AC
  Filled 2016-09-24: qty 1

## 2016-09-24 MED ORDER — IOPAMIDOL (ISOVUE-370) INJECTION 76%
INTRAVENOUS | Status: AC
  Administered 2016-09-24: 80 mL
  Filled 2016-09-24: qty 100

## 2016-09-24 MED ORDER — METOPROLOL TARTRATE 5 MG/5ML IV SOLN
5.0000 mg | INTRAVENOUS | Status: DC | PRN
  Administered 2016-09-24 (×3): 5 mg via INTRAVENOUS
  Filled 2016-09-24 (×3): qty 5

## 2016-09-24 MED ORDER — METOPROLOL TARTRATE 5 MG/5ML IV SOLN
INTRAVENOUS | Status: AC
  Administered 2016-09-24: 5 mg
  Filled 2016-09-24: qty 15

## 2016-09-24 MED ORDER — METOPROLOL TARTRATE 5 MG/5ML IV SOLN
INTRAVENOUS | Status: AC
  Filled 2016-09-24: qty 5

## 2016-09-24 MED ORDER — NITROGLYCERIN 0.4 MG SL SUBL
0.4000 mg | SUBLINGUAL_TABLET | Freq: Once | SUBLINGUAL | Status: AC
  Administered 2016-09-24: 0.4 mg via SUBLINGUAL
  Filled 2016-09-24: qty 25

## 2016-09-24 NOTE — Progress Notes (Signed)
CT completed. Tolerated well. D/C home walking by herself. Awake and alert. In  No distress.

## 2016-09-26 ENCOUNTER — Ambulatory Visit: Payer: 59 | Admitting: Obstetrics & Gynecology

## 2016-10-01 ENCOUNTER — Telehealth: Payer: Self-pay | Admitting: *Deleted

## 2016-10-01 NOTE — Telephone Encounter (Signed)
-----   Message from Leonie Man, MD sent at 09/25/2016 11:21 AM EST ----- Coronary CT scan looks pretty goodsignificant narrowings noted. Of course we cannot exclude coronary artery spasm from this, but we can comfortably say that there is no significant blockages.  There is mild plaquing which would indicate that we need to be monitoring cardiac risk factors  Can discuss this more in follow-up.  Glenetta Hew, MD  Pls forward to PCP.

## 2016-10-01 NOTE — Telephone Encounter (Signed)
spoke to patient. Result given . Verbalized understanding Patient states she will lhave to call tomorrow to make an appointment Routed to pcp Dr Raliegh Ip. Meredith Shannon

## 2016-11-06 ENCOUNTER — Encounter: Payer: Self-pay | Admitting: Cardiology

## 2016-11-06 ENCOUNTER — Ambulatory Visit (INDEPENDENT_AMBULATORY_CARE_PROVIDER_SITE_OTHER): Payer: BLUE CROSS/BLUE SHIELD | Admitting: Cardiology

## 2016-11-06 DIAGNOSIS — R079 Chest pain, unspecified: Secondary | ICD-10-CM

## 2016-11-06 MED ORDER — NITROGLYCERIN 0.4 MG SL SUBL: 0.4000 mg | SUBLINGUAL_TABLET | SUBLINGUAL | 3 refills | Status: DC | PRN

## 2016-11-06 NOTE — Progress Notes (Signed)
PCP: Jobos, PA-C  OB-Gyn: Lyman Speller, MD  Clinic Note: Chief Complaint  Patient presents with  . Follow-up    F/U CT Coronary Morp  . Chest Pain    HPI: Meredith Shannon is a 61 y.o. female with a PMH below who presents today for Evaluation of episodic chest pain. She was referred by her gynecologist.  Recent Hospitalizations: None  Studies Reviewed:  Coronary CTA & Calcium Score: 1. Coronary artery calcium score 2.1 Agatston units, placing the patient in the 67th percentile for age and gender. This suggests intermediate risk for future cardiac events.  2.  Mild mid LAD stenosis.  No obstructive disease.  Interval History: Lovena presents today relatively stable. She has still had maybe 2 episodes since her last visit of the chest discomfort. She really describes it more of left upper chest and jaw discomfort. It is mostly is not always associated with rest. She has not noticed it at all with exertion, and she does not otherwise notice exertional dyspnea. Usually these occur in the evening if not late afternoon. She doesn't really notice any significant palpitations but has very rare episodes of feeling her heart temporarily stop which causes her to gasp for breath. It then goes away without any untoward aftermath.   She denies any PND, orthopnea or edema. Rapid irregular heartbeats, syncope/near syncope or TIA/amaurosis fugax.   No claudication.  ROS: A comprehensive was performed. Review of Systems  Constitutional: Negative for malaise/fatigue.  HENT: Negative for nosebleeds.   Respiratory: Negative for cough, sputum production and wheezing.   Cardiovascular: Positive for chest pain (per HPI). Negative for claudication.  Gastrointestinal: Negative for vomiting.  Neurological: Negative for dizziness.  All other systems reviewed and are negative.   Past Medical History:  Diagnosis Date  . Broken wrist 2008  . High cholesterol    In total cholesterol  and triglycerides.  Marland Kitchen HSV-1 infection   . Lichen simplex chronicus     Past Surgical History:  Procedure Laterality Date  . BREAST BIOPSY  5/12   fibrocystic changes  . DILATION AND CURETTAGE OF UTERUS    . FACIAL COSMETIC SURGERY    . TUBAL LIGATION  1983    Current Meds  Medication Sig  . acyclovir (ZOVIRAX) 800 MG tablet Take 800 mg by mouth 2 (two) times daily.  . Calcium Carbonate-Vit D-Min (CALCIUM 1200 PO) Take by mouth.  . Cholecalciferol (VITAMIN D) 2000 UNITS CAPS Take by mouth.  Marland Kitchen KRILL OIL PO Take by mouth.  Marland Kitchen MAGNESIUM PO Take by mouth.  . rosuvastatin (CRESTOR) 20 MG tablet Take 20 mg by mouth daily.  Marland Kitchen VITAMIN E PO Take by mouth.    Allergies  Allergen Reactions  . Penicillins Rash and Other (See Comments)    Tongue swelling    Social History   Social History  . Marital status: Married    Spouse name: N/A  . Number of children: 1  . Years of education: N/A   Social History Main Topics  . Smoking status: Former Smoker    Types: Cigarettes    Quit date: 08/04/1984  . Smokeless tobacco: Never Used  . Alcohol use 0.6 - 1.2 oz/week    1 - 2 Standard drinks or equivalent per week  . Drug use: No  . Sexual activity: Not Currently    Partners: Male    Birth control/ protection: Post-menopausal   Other Topics Concern  . None   Social History Narrative  . None  family history includes Osteoporosis in her mother.  Wt Readings from Last 3 Encounters:  11/06/16 216 lb 9.6 oz (98.2 kg)  09/24/16 215 lb (97.5 kg)  08/28/16 215 lb (97.5 kg)    PHYSICAL EXAM BP 110/82 (BP Location: Right Arm, Patient Position: Sitting, Cuff Size: Normal)   Pulse 86   Ht 5' 10.5" (1.791 m)   Wt 216 lb 9.6 oz (98.2 kg)   LMP 08/04/2006   BMI 30.64 kg/m  General appearance: alert, cooperative, appears stated age, no distress and Borderline obese Neck: no adenopathy, no carotid bruit and no JVD Lungs: clear to auscultation bilaterally, normal percussion  bilaterally and non-labored Heart: regular rate and rhythm, S1, S2 normal, no murmur, click, rub or gallop; nondisplaced PMI; Pulses: 2+ and symmetric;  Abdomen: soft, non-tender; bowel sounds normal; no masses,  no organomegaly; no HJR Neurologic: Mental status: Alert, oriented, thought content appropriate    Adult ECG Report  Rate: 86 ;  Rhythm: normal sinus rhythm and Normal axis, intervals and durations. Cannot exclude left atrial enlargement.;   Narrative Interpretation: Normal EKG   Other studies Reviewed: Additional studies/ records that were reviewed today include:  Recent Labs:  N/A  ASSESSMENT / PLAN: Problem List Items Addressed This Visit    Chest pain with moderate risk for cardiac etiology - concern for coronary spasm    Reassuring results with coronary CTA suggesting no significant coronary disease. Her symptoms did not sound consistent with exertional angina, but she may have some microvascular coronary spasm. The location and description does sound consistent with 1 spasm, to a greater degree than musculoskeletal discomfort. More for diagnostic purposes, I think it's reasonable to provide her with when necessary nitroglycerin to see if it will help. If her symptoms are improved with nitroglycerin, then we could consider a long-acting nitrate or amlodipine.  Reassess in 4 months         Current medicines are reviewed at length with the patient today. (+/- concerns) None The following changes have been made: None  Patient Instructions  Medication Instructions: Dr. Ellyn Hack has prescribed you Nitroglycerin sublingual tablets to take as needed. Please follow instruction given at visit, and let us know if this helps with your symptoms.   Labwork: none   Testing/Procedures: none   Follow-Up: 4 months  If you need a refill on your cardiac medications before your next appointment, please call your pharmacy.     Studies Ordered:   No orders of the defined  types were placed in this encounter.     Glenetta Hew, M.D., M.S. Interventional Cardiologist   Pager # [phone] Phone # [phone] [address]. [place], [place] [address]

## 2016-11-06 NOTE — Patient Instructions (Signed)
Medication Instructions: Dr. Ellyn Hack has prescribed you Nitroglycerin sublingual tablets to take as needed. Please follow instruction given at visit, and let us know if this helps with your symptoms.   Labwork: none   Testing/Procedures: none   Follow-Up: 4 months  If you need a refill on your cardiac medications before your next appointment, please call your pharmacy.

## 2016-11-07 ENCOUNTER — Telehealth: Payer: Self-pay | Admitting: *Deleted

## 2016-11-07 NOTE — Telephone Encounter (Signed)
Call to patient for update on cardiac follow up needed prior to scheduling surgery with Dr. Sabra Heck. Patient states her appointment with Dr. Ellyn Hack was yesterday. Office visit available to view in [facility]. She states she is ready to proceed with scheduling her surgery ASAP. Advised patient will send this message to our surgery scheduler, [name], and she will be in touch with patient. Patient agreeable.

## 2016-11-07 NOTE — Telephone Encounter (Signed)
Yes, ok to proceed.  Will need to review cardiology note but it is not completed yet.  Thanks.

## 2016-11-07 NOTE — Telephone Encounter (Signed)
Ok to proceed with scheduling? Will schedule office visit here as well.

## 2016-11-08 ENCOUNTER — Encounter: Payer: Self-pay | Admitting: Cardiology

## 2016-11-08 NOTE — Assessment & Plan Note (Addendum)
Reassuring results with coronary CTA suggesting no significant coronary disease. Her symptoms did not sound consistent with exertional angina, but she may have some microvascular coronary spasm. The location and description does sound consistent with 1 spasm, to a greater degree than musculoskeletal discomfort. More for diagnostic purposes, I think it's reasonable to provide her with when necessary nitroglycerin to see if it will help. If her symptoms are improved with nitroglycerin, then we could consider a long-acting nitrate or amlodipine.  Reassess in 4 months

## 2016-11-17 NOTE — Telephone Encounter (Signed)
Return call to patient regarding surgery date preferences.  Patient requesting Friday option but is not available on next available Friday. May 11.  Advised will need to review schedule and call her back.

## 2016-11-17 NOTE — Telephone Encounter (Signed)
Patient would like to speak with someone about scheduling/paying for her surgery has some questions

## 2016-11-18 NOTE — Telephone Encounter (Signed)
Reviewed with Dr Sabra Heck. Call to patient. Date options discussed. First available Friday is 12-26-16. Advised it is Dr Ammie Ferrier recommendation to proceed with surgery now that she has been cleared by cardiology.   Patient aware of recommendations and states she is appreciative but she is prefers to wait till 12-26-16. States this will work much better for her work and currently scheduled travel. Advised again that now that she has been cleared, recommendation would be to proceed and she needs to be aware that delay could impact outcome. She is agreeable to delay since it is only a few weeks later. Surgery scheduled for 12-26-16 at [facility]. Surgery instruction sheet reviewed and printed copy will be mailed. Advised Dr Sabra Heck will review call and will call her back if any additional recommendations.  Routing to provider for final review.

## 2016-11-19 ENCOUNTER — Other Ambulatory Visit: Payer: Self-pay | Admitting: Obstetrics & Gynecology

## 2016-11-19 NOTE — Telephone Encounter (Signed)
Thank you for the update.  Orders placed.  Ok to close encounter.

## 2016-12-15 ENCOUNTER — Ambulatory Visit (INDEPENDENT_AMBULATORY_CARE_PROVIDER_SITE_OTHER): Payer: BLUE CROSS/BLUE SHIELD | Admitting: Obstetrics & Gynecology

## 2016-12-15 ENCOUNTER — Encounter: Payer: Self-pay | Admitting: Obstetrics & Gynecology

## 2016-12-15 VITALS — BP 118/80 | HR 80 | Resp 16 | Ht 70.5 in | Wt 217.6 lb

## 2016-12-15 DIAGNOSIS — N840 Polyp of corpus uteri: Secondary | ICD-10-CM | POA: Diagnosis not present

## 2016-12-15 DIAGNOSIS — R938 Abnormal findings on diagnostic imaging of other specified body structures: Secondary | ICD-10-CM | POA: Diagnosis not present

## 2016-12-15 DIAGNOSIS — R9389 Abnormal findings on diagnostic imaging of other specified body structures: Secondary | ICD-10-CM

## 2016-12-15 NOTE — Progress Notes (Signed)
61 y.o. [id number] MarriedCaucasian female for discussion of surgical procedure.  Pt was initially seen in January for ultrasound due to concerns about ovarian cancer.  10 x 56mm endometrial polyp was noted and resection was recommended.  Pt reported hx of chest pain at time of AEX in January as well.  She was sent for cardiology evaluation.  She has been seen by Dr. Ellyn Hack.  She had coronary CT with CTA.  Evaluation showed moderate mid LAD stenosis but no obstructive disease.  She now has nitroglycerine to use if she experiences chest pain issues.  She was not started on aspirin.    As it has been several months since I last saw pt, procedure discussed with patient.  Recovery and pain management discussed.  Risks discussed including but not limited to bleeding, rare risk of transfusion, infection, 1% risk of uterine perforation with risks of fluid deficit causing cardiac arrythmia, cerebral swelling and/or need to stop procedure early.  Fluid emboli and rare risk of death discussed.  DVT/PE, rare risk of risk of bowel/bladder/ureteral/vascular injury.  Patient aware if pathology abnormal she may need additional treatment.  All questions answered.    Ob Hx:   Patient's last menstrual period was 08/04/2006.          Sexually active: Yes.   Birth control: Post menopausal  Last pap: 06/03/16 Neg. HR HPV:neg  Last MMG: 06/24/16 BIRADS1:neg  Tobacco: Former   Past Surgical History:  Procedure Laterality Date  . BREAST BIOPSY  5/12   fibrocystic changes  . DILATION AND CURETTAGE OF UTERUS    . FACIAL COSMETIC SURGERY    . TUBAL LIGATION  1983    Past Medical History:  Diagnosis Date  . Broken wrist 2008  . High cholesterol    In total cholesterol and triglycerides.  Marland Kitchen HSV-1 infection   . Lichen simplex chronicus     Allergies: Penicillins  Current Outpatient Prescriptions  Medication Sig Dispense Refill  . acyclovir (ZOVIRAX) 800 MG tablet Take 800 mg by mouth daily.     . Calcium  Carb-Cholecalciferol (CALCIUM + D3 PO) Take 1 tablet by mouth daily.    . Cholecalciferol (VITAMIN D) 2000 UNITS CAPS Take 2,000 Units by mouth daily.     . IBU 600 MG tablet Take 600 mg by mouth every 8 (eight) hours as needed for pain.  0  . MAGNESIUM PO Take 1 tablet by mouth daily.     . methocarbamol (ROBAXIN) 750 MG tablet Take 750 mg by mouth 2 (two) times daily as needed for muscle spasms.  0  . Misc. Devices (VAGINAL SUPPOSITORY APPLICATOR) MISC Place 301 Units vaginally 3 (three) times a week. vitamin E (VITAMIN E) 200 UNIT capsule INSERT 1 VAGINALLY SUPPOSITORY 3 TIMES WEEKLY    . nitroGLYCERIN (NITROSTAT) 0.4 MG SL tablet Place 1 tablet (0.4 mg total) under the tongue every 5 (five) minutes as needed for chest pain. 25 tablet 3  . rosuvastatin (CRESTOR) 20 MG tablet Take 20 mg by mouth daily.    . vitamin C (ASCORBIC ACID) 500 MG tablet Take 500 mg by mouth daily.    . vitamin E 1000 UNIT capsule Take 1,000 Units by mouth daily.     No current facility-administered medications for this visit.     ROS: A comprehensive review of systems was negative.  Exam:    LMP 08/04/2006   General appearance: alert and cooperative Head: Normocephalic, without obvious abnormality, atraumatic Neck: no adenopathy, supple, symmetrical, trachea midline and  thyroid not enlarged, symmetric, no tenderness/mass/nodules Lungs: clear to auscultation bilaterally Heart: regular rate and rhythm, S1, S2 normal, no murmur, click, rub or gallop Abdomen: soft, non-tender; bowel sounds normal; no masses,  no organomegaly Extremities: extremities normal, atraumatic, no cyanosis or edema Skin: Skin color, texture, turgor normal. No rashes or lesions Lymph nodes: Cervical, supraclavicular, and axillary nodes normal. no inguinal nodes palpated Neurologic: Grossly normal  Pelvic: not performed  A: Thickened endometrium with probable polyp Chest pain that has been evaluated by cardiology     P:   Hysteroscopy with polyp resection, D&C planned Pt has "left over" Vicodin so no new Rx given. Medications/Vitamins reviewed.  Pt knows needs to stop any ASA products five days before procedure. Pre and post op instructions reviewed.  ~20 minutes spent with patient >50% of time was in face to face discussion of above.

## 2016-12-16 ENCOUNTER — Encounter: Payer: Self-pay | Admitting: Obstetrics & Gynecology

## 2016-12-16 ENCOUNTER — Telehealth: Payer: Self-pay | Admitting: Obstetrics & Gynecology

## 2016-12-16 NOTE — Telephone Encounter (Signed)
Return call to patient. Spoke with hospital business office today and wanted to confirm we had given correct CPT code for her surgery. States estimate is pretty high for "20 minute procedure." Code reviewed and patient confirms she has "googled it" herself.  Support given. Reviewed options with hospital. Call back PRN.  Routing to provider for final review. Patient agreeable to disposition. Will close encounter.

## 2016-12-16 NOTE — Telephone Encounter (Signed)
Patient wants to speak with someone concerning her surgery. She spoke with the hospital today and has some concerns.

## 2016-12-17 ENCOUNTER — Encounter (HOSPITAL_COMMUNITY)
Admission: RE | Admit: 2016-12-17 | Discharge: 2016-12-17 | Disposition: A | Discharge status: Home / Self Care | Payer: BLUE CROSS/BLUE SHIELD | Source: Ambulatory Visit | Attending: Obstetrics & Gynecology | Admitting: Obstetrics & Gynecology

## 2016-12-17 ENCOUNTER — Encounter (HOSPITAL_COMMUNITY): Payer: Self-pay

## 2016-12-17 DIAGNOSIS — L280 Lichen simplex chronicus: Secondary | ICD-10-CM | POA: Diagnosis not present

## 2016-12-17 DIAGNOSIS — N941 Unspecified dyspareunia: Secondary | ICD-10-CM | POA: Insufficient documentation

## 2016-12-17 DIAGNOSIS — R079 Chest pain, unspecified: Secondary | ICD-10-CM | POA: Diagnosis not present

## 2016-12-17 DIAGNOSIS — E785 Hyperlipidemia, unspecified: Secondary | ICD-10-CM | POA: Insufficient documentation

## 2016-12-17 DIAGNOSIS — Z01812 Encounter for preprocedural laboratory examination: Secondary | ICD-10-CM | POA: Insufficient documentation

## 2016-12-17 HISTORY — DX: Gastro-esophageal reflux disease without esophagitis: K21.9

## 2016-12-17 HISTORY — DX: Malignant (primary) neoplasm, unspecified: C80.1

## 2016-12-17 LAB — BASIC METABOLIC PANEL
ANION GAP: 6 (ref 5–15)
BUN: 22 mg/dL — AB (ref 6–20)
CO2: 28 mmol/L (ref 22–32)
CREATININE: 0.79 mg/dL (ref 0.44–1.00)
Calcium: 9.4 mg/dL (ref 8.9–10.3)
Chloride: 104 mmol/L (ref 101–111)
GFR calc Af Amer: >60 mL/min (ref >60)
Glucose, Bld: 91 mg/dL (ref 65–99)
POTASSIUM: 4.4 mmol/L (ref 3.5–5.1)
SODIUM: 138 mmol/L (ref 135–145)

## 2016-12-17 LAB — CBC
HCT: 41.3 % (ref 36.0–46.0)
Hemoglobin: 14.5 g/dL (ref 12.0–15.0)
MCH: 32.3 pg (ref 26.0–34.0)
MCHC: 35.1 g/dL (ref 30.0–36.0)
MCV: 92 fL (ref 78.0–100.0)
Platelets: 214 10*3/uL (ref 150–400)
RBC: 4.49 MIL/uL (ref 3.87–5.11)
RDW: 12.4 % (ref 11.5–15.5)
WBC: 8 10*3/uL (ref 4.0–10.5)

## 2016-12-17 NOTE — Patient Instructions (Addendum)
Your procedure is scheduled on: Friday Dec 26, 2016 at 7:30 am  Enter through the [employer] [facility] at: 6 am  Pick up the phone at the desk and dial [phone].  Call this number if you have problems the morning of surgery: [phone].  Remember: Do NOT eat food or drink any liquids after: Midnight on Thursday May 24 Do NOT drink clear liquids after: Take these medicines the morning of surgery with a SIP OF WATER: Crestor  STOP ALL VITAMINS AND SUPPLEMENTS 1 WEEK PRIOR TO SURGERY  Do NOT wear jewelry (body piercing), metal hair clips/bobby pins, make-up, or nail polish. Do NOT wear lotions, powders, or perfumes.  You may wear deoderant. Do NOT shave for 48 hours prior to surgery. Do NOT bring valuables to the hospital. Contacts, dentures, or bridgework may not be worn into surgery.  Have a responsible adult drive you home and stay with you for 24 hours after your procedure

## 2016-12-26 ENCOUNTER — Encounter (HOSPITAL_COMMUNITY)
Admission: RE | Disposition: A | Discharge status: Home / Self Care | Payer: Self-pay | Source: Ambulatory Visit | Attending: Obstetrics & Gynecology

## 2016-12-26 ENCOUNTER — Ambulatory Visit (HOSPITAL_COMMUNITY)
Admission: RE | Admit: 2016-12-26 | Discharge: 2016-12-26 | Disposition: A | Discharge status: Home / Self Care | Payer: BLUE CROSS/BLUE SHIELD | Source: Ambulatory Visit | Attending: Obstetrics & Gynecology | Admitting: Obstetrics & Gynecology

## 2016-12-26 ENCOUNTER — Encounter (HOSPITAL_COMMUNITY): Payer: Self-pay | Admitting: *Deleted

## 2016-12-26 ENCOUNTER — Ambulatory Visit (HOSPITAL_COMMUNITY): Payer: BLUE CROSS/BLUE SHIELD | Admitting: Anesthesiology

## 2016-12-26 DIAGNOSIS — Z79899 Other long term (current) drug therapy: Secondary | ICD-10-CM | POA: Insufficient documentation

## 2016-12-26 DIAGNOSIS — Z87891 Personal history of nicotine dependence: Secondary | ICD-10-CM | POA: Insufficient documentation

## 2016-12-26 DIAGNOSIS — N840 Polyp of corpus uteri: Secondary | ICD-10-CM | POA: Diagnosis not present

## 2016-12-26 DIAGNOSIS — K219 Gastro-esophageal reflux disease without esophagitis: Secondary | ICD-10-CM | POA: Diagnosis not present

## 2016-12-26 DIAGNOSIS — I2510 Atherosclerotic heart disease of native coronary artery without angina pectoris: Secondary | ICD-10-CM | POA: Diagnosis not present

## 2016-12-26 DIAGNOSIS — N8501 Benign endometrial hyperplasia: Secondary | ICD-10-CM | POA: Diagnosis not present

## 2016-12-26 DIAGNOSIS — Z880 Allergy status to penicillin: Secondary | ICD-10-CM | POA: Insufficient documentation

## 2016-12-26 DIAGNOSIS — E7800 Pure hypercholesterolemia, unspecified: Secondary | ICD-10-CM | POA: Diagnosis not present

## 2016-12-26 HISTORY — PX: DILATATION & CURETTAGE/HYSTEROSCOPY WITH MYOSURE: SHX6511

## 2016-12-26 SURGERY — DILATATION & CURETTAGE/HYSTEROSCOPY WITH MYOSURE
Anesthesia: General | Site: Vagina

## 2016-12-26 MED ORDER — MIDAZOLAM HCL 5 MG/5ML IJ SOLN
INTRAMUSCULAR | Status: DC | PRN
  Administered 2016-12-26: 2 mg via INTRAVENOUS

## 2016-12-26 MED ORDER — FENTANYL CITRATE (PF) 100 MCG/2ML IJ SOLN
INTRAMUSCULAR | Status: DC | PRN
  Administered 2016-12-26 (×2): 50 ug via INTRAVENOUS

## 2016-12-26 MED ORDER — KETOROLAC TROMETHAMINE 30 MG/ML IJ SOLN: 30.0000 mg | Freq: Once | INTRAMUSCULAR | Status: DC | PRN

## 2016-12-26 MED ORDER — PROPOFOL 10 MG/ML IV BOLUS
INTRAVENOUS | Status: AC
  Filled 2016-12-26: qty 20

## 2016-12-26 MED ORDER — LIDOCAINE HCL (CARDIAC) 20 MG/ML IV SOLN
INTRAVENOUS | Status: DC | PRN
  Administered 2016-12-26: 40 mg via INTRAVENOUS

## 2016-12-26 MED ORDER — ONDANSETRON HCL 4 MG/2ML IJ SOLN
INTRAMUSCULAR | Status: AC
  Filled 2016-12-26: qty 2

## 2016-12-26 MED ORDER — ONDANSETRON HCL 4 MG/2ML IJ SOLN: 4.0000 mg | Freq: Once | INTRAMUSCULAR | Status: DC | PRN

## 2016-12-26 MED ORDER — KETOROLAC TROMETHAMINE 30 MG/ML IJ SOLN
INTRAMUSCULAR | Status: DC | PRN
  Administered 2016-12-26: 30 mg via INTRAVENOUS

## 2016-12-26 MED ORDER — FENTANYL CITRATE (PF) 100 MCG/2ML IJ SOLN: 25.0000 ug | INTRAMUSCULAR | Status: DC | PRN

## 2016-12-26 MED ORDER — MIDAZOLAM HCL 2 MG/2ML IJ SOLN
INTRAMUSCULAR | Status: AC
  Filled 2016-12-26: qty 2

## 2016-12-26 MED ORDER — LIDOCAINE-EPINEPHRINE 1 %-1:100000 IJ SOLN
INTRAMUSCULAR | Status: AC
  Filled 2016-12-26: qty 1

## 2016-12-26 MED ORDER — DEXAMETHASONE SODIUM PHOSPHATE 10 MG/ML IJ SOLN
INTRAMUSCULAR | Status: AC
  Filled 2016-12-26: qty 1

## 2016-12-26 MED ORDER — LACTATED RINGERS IV SOLN
INTRAVENOUS | Status: DC
  Administered 2016-12-26: 07:00:00 via INTRAVENOUS

## 2016-12-26 MED ORDER — SODIUM CHLORIDE 0.9 % IR SOLN
Status: DC | PRN
  Administered 2016-12-26: 3000 mL

## 2016-12-26 MED ORDER — ACETAMINOPHEN 160 MG/5ML PO SOLN: 325.0000 mg | ORAL | Status: DC | PRN

## 2016-12-26 MED ORDER — LIDOCAINE HCL 1 % IJ SOLN
INTRAMUSCULAR | Status: AC
  Filled 2016-12-26: qty 10

## 2016-12-26 MED ORDER — OXYCODONE HCL 5 MG/5ML PO SOLN: 5.0000 mg | Freq: Once | ORAL | Status: DC | PRN

## 2016-12-26 MED ORDER — MEPERIDINE HCL 25 MG/ML IJ SOLN: 6.2500 mg | INTRAMUSCULAR | Status: DC | PRN

## 2016-12-26 MED ORDER — KETOROLAC TROMETHAMINE 30 MG/ML IJ SOLN
INTRAMUSCULAR | Status: AC
  Filled 2016-12-26: qty 1

## 2016-12-26 MED ORDER — LIDOCAINE HCL (CARDIAC) 20 MG/ML IV SOLN
INTRAVENOUS | Status: AC
  Filled 2016-12-26: qty 5

## 2016-12-26 MED ORDER — DEXAMETHASONE SODIUM PHOSPHATE 10 MG/ML IJ SOLN
INTRAMUSCULAR | Status: DC | PRN
  Administered 2016-12-26: 10 mg via INTRAVENOUS

## 2016-12-26 MED ORDER — SCOPOLAMINE 1 MG/3DAYS TD PT72
1.0000 | MEDICATED_PATCH | Freq: Once | TRANSDERMAL | Status: DC
  Administered 2016-12-26: 1.5 mg via TRANSDERMAL

## 2016-12-26 MED ORDER — FENTANYL CITRATE (PF) 100 MCG/2ML IJ SOLN
INTRAMUSCULAR | Status: AC
  Filled 2016-12-26: qty 2

## 2016-12-26 MED ORDER — SCOPOLAMINE 1 MG/3DAYS TD PT72
MEDICATED_PATCH | TRANSDERMAL | Status: AC
  Administered 2016-12-26: 1.5 mg via TRANSDERMAL
  Filled 2016-12-26: qty 1

## 2016-12-26 MED ORDER — LIDOCAINE-EPINEPHRINE 1 %-1:100000 IJ SOLN
INTRAMUSCULAR | Status: DC | PRN
Start: 2016-12-26 — End: 2016-12-26
  Administered 2016-12-26: 10 mL

## 2016-12-26 MED ORDER — ONDANSETRON HCL 4 MG/2ML IJ SOLN
INTRAMUSCULAR | Status: DC | PRN
  Administered 2016-12-26: 4 mg via INTRAVENOUS

## 2016-12-26 MED ORDER — OXYCODONE HCL 5 MG PO TABS: 5.0000 mg | ORAL_TABLET | Freq: Once | ORAL | Status: DC | PRN

## 2016-12-26 MED ORDER — ACETAMINOPHEN 325 MG PO TABS: 325.0000 mg | ORAL_TABLET | ORAL | Status: DC | PRN

## 2016-12-26 MED ORDER — PROPOFOL 10 MG/ML IV BOLUS
INTRAVENOUS | Status: DC | PRN
  Administered 2016-12-26: 150 mg via INTRAVENOUS

## 2016-12-26 SURGICAL SUPPLY — 20 items
CANISTER SUCT 3000ML PPV (MISCELLANEOUS) ×3 IMPLANT
CATH ROBINSON RED A/P 16FR (CATHETERS) ×3 IMPLANT
CLOTH BEACON ORANGE TIMEOUT ST (SAFETY) ×3 IMPLANT
CONTAINER PREFILL 10% NBF 60ML (FORM) ×6 IMPLANT
DEVICE MYOSURE LITE (MISCELLANEOUS) ×2 IMPLANT
DEVICE MYOSURE REACH (MISCELLANEOUS) IMPLANT
DILATOR CANAL MILEX (MISCELLANEOUS) IMPLANT
ELECT REM PT RETURN 9FT ADLT (ELECTROSURGICAL)
ELECTRODE REM PT RTRN 9FT ADLT (ELECTROSURGICAL) IMPLANT
FILTER ARTHROSCOPY CONVERTOR (FILTER) ×1 IMPLANT
GLOVE BIOGEL PI IND STRL 7.0 (GLOVE) ×2 IMPLANT
GLOVE BIOGEL PI INDICATOR 7.0 (GLOVE) ×4
GLOVE ECLIPSE 6.5 STRL STRAW (GLOVE) ×3 IMPLANT
GOWN STRL REUS W/TWL LRG LVL3 (GOWN DISPOSABLE) ×6 IMPLANT
PACK VAGINAL MINOR WOMEN LF (CUSTOM PROCEDURE TRAY) ×3 IMPLANT
PAD OB MATERNITY 4.3X12.25 (PERSONAL CARE ITEMS) ×3 IMPLANT
SEAL ROD LENS SCOPE MYOSURE (ABLATOR) ×3 IMPLANT
TOWEL OR 17X24 6PK STRL BLUE (TOWEL DISPOSABLE) ×6 IMPLANT
TUBING AQUILEX INFLOW (TUBING) ×3 IMPLANT
TUBING AQUILEX OUTFLOW (TUBING) ×3 IMPLANT

## 2016-12-26 NOTE — Discharge Instructions (Addendum)
Post-surgical Instructions, Outpatient Surgery  You may expect to feel dizzy, weak, and drowsy for as long as 24 hours after receiving the medicine that made you sleep (anesthetic). For the first 24 hours after your surgery:    Do not drive a car, ride a bicycle, participate in physical activities, or take public transportation until you are done taking narcotic pain medicines or as directed by Dr. Sabra Heck.   Do not drink alcohol or take tranquilizers.   Do not take medicine that has not been prescribed by your physicians.   Do not sign important papers or make important decisions while on narcotic pain medicines.   Have a responsible person with you.   If you have any questions or concerns, please feel free to page me at [phone].  Thanks.  CARE OF INCISION  If you have a bandage, you may remove it in one day.  If there are steri-strips or dermabond, just let this loosen on its own.   You may shower on the first day after your surgery.  Do not sit in a tub bath for one week.  Avoid heavy lifting (more than 10 pounds/4.5 kilograms), pushing, or pulling.   Avoid activities that may risk injury to your incisions.   PAIN MANAGEMENT  Motrin 800mg .  (This is the same as 4-200mg  over the counter tablets of Motrin or ibuprofen.)  You may take this every eight hours or as needed for cramping.    Vicodin 5/325mg .  For more severe pain, take one or two tablets every four to six hours as needed for pain control.  (Remember that narcotic pain medications increase your risk of constipation.  If this becomes a problem, you may take an over the counter stool softener like Colace 100mg  up to four times a day.)  DO'S AND DON'T'S  Do not take a tub bath for one week.  You may shower on the first day after your surgery  Do not do any heavy lifting for one to two weeks.  This increases the chance of bleeding.  Do move around as you feel able.  Stairs are fine.  You may begin to exercise again as  you feel able.  Do not lift any weights for two weeks.  Do not put anything in the vagina for two weeks--no tampons, intercourse, or douching.    REGULAR MEDIATIONS/VITAMINS:  You may restart all of your regular medications as prescribed.  You may restart all of your vitamins as you normally take them.    PLEASE CALL OR SEEK MEDICAL CARE IF:  You have persistent nausea and vomiting.   You have trouble eating or drinking.   You have an oral temperature above 100.5.   You have constipation that is not helped by adjusting diet or increasing fluid intake. Pain medicines are a common cause of constipation.   You have heavy vaginal bleeding

## 2016-12-26 NOTE — Anesthesia Preprocedure Evaluation (Signed)
Anesthesia Evaluation  Patient identified by MRN, date of birth, ID band Patient awake    Reviewed: Allergy & Precautions, NPO status , Patient's Chart, lab work & pertinent test results  Airway Mallampati: I       Dental no notable dental hx.    Pulmonary neg pulmonary ROS, former smoker,    Pulmonary exam normal breath sounds clear to auscultation       Cardiovascular Normal cardiovascular exam Rhythm:Regular Rate:Normal     Neuro/Psych negative neurological ROS  negative psych ROS   GI/Hepatic Neg liver ROS,   Endo/Other  negative endocrine ROS  Renal/GU negative Renal ROS  negative genitourinary   Musculoskeletal negative musculoskeletal ROS (+)   Abdominal Normal abdominal exam  (+)   Peds  Hematology negative hematology ROS (+)   Anesthesia Other Findings   Reproductive/Obstetrics                             Anesthesia Physical Anesthesia Plan  ASA: II  Anesthesia Plan: General   Post-op Pain Management:    Induction: Intravenous  Airway Management Planned: LMA  Additional Equipment:   Intra-op Plan:   Post-operative Plan:   Informed Consent: I have reviewed the patients History and Physical, chart, labs and discussed the procedure including the risks, benefits and alternatives for the proposed anesthesia with the patient or authorized representative who has indicated his/her understanding and acceptance.     Plan Discussed with: CRNA and Surgeon  Anesthesia Plan Comments:         Anesthesia Quick Evaluation

## 2016-12-26 NOTE — Op Note (Signed)
12/26/2016  7:50 AM  PATIENT:  Meredith Shannon  61 y.o. female  PRE-OPERATIVE DIAGNOSIS:  thickened endometrium, endometrial polyp  POST-OPERATIVE DIAGNOSIS:  thickened endometrium, endometrial polyp  PROCEDURE:  Procedure(s): DILATATION & CURETTAGE/HYSTEROSCOPY WITH MYOSURE  SURGEON:  Garreth Burnsworth SUZANNE  ASSISTANTS: OR staff   ANESTHESIA:   general  ESTIMATED BLOOD LOSS: 5cc  BLOOD ADMINISTERED:none   FLUIDS: 600ccLR  UOP: 100cc clear UOP at beginning of procedure  SPECIMEN:  Endometrial polyp and curettings  DISPOSITION OF SPECIMEN:  PATHOLOGY  FINDINGS: endometrial polyp that almost filled the endometrial cavity, thin and atrophic appearing endometrium  DESCRIPTION OF OPERATION: Patient was taken to the operating room.  She is placed in the supine position. SCDs were on her lower extremities and functioning properly. General anesthesia with an LMA was administered without difficulty.   Legs were then placed in the [place] in the low lithotomy position. The legs were lifted to the high lithotomy position and the Betadine prep was used on the inner thighs perineum and vagina x3. Patient was draped in a normal standard fashion. An in and out catheterization with a red rubber Foley catheter was performed. Approximately 100 cc of clear urine was noted. A bivalve speculum was placed the vagina. The anterior lip of the cervix was grasped with single-tooth tenaculum.  A paracervical block of 1% lidocaine mixed one-to-one with epinephrine (1:100,000 units).  10 cc was used total. The cervix is dilated up to #21 [facility] dilators. The endometrial cavity sounded to 7 cm.   A Myosure millimeter diagnostic hysteroscope was obtained. NS was used as a hysteroscopic fluid. The hysteroscope was advanced through the endocervical canal into the endometrial cavity. The tubal ostium was noted on the left.  There was a sizable endometrial polyp with thin appearing endometrium that was present.   The right tubal ostium could not be visualized due to the polyp.  A myosure lite device was obtained and passed through the hysteroscope. Using the [facility] device, the polyp was fully resected.  Photo documentation was obtained.  The hysteroscope was removed.  Then using a #1 toothed curette, the cavity was curetted until a rough gritty texture was noted in all quadrants. At this point, the procedure was ended. The hysteroscope was removed. The fluid deficit was 275 cc. The tenaculum was removed from the anterior lip of the cervix. The speculum was removed from the vagina. The prep was cleansed of the patient's skin. The legs are positioned back in the supine position. Sponge, lap, needle, initially counts were correct x2. Patient was taken to recovery in stable condition.  COUNTS:  YES  PLAN OF CARE: Transfer to PACU

## 2016-12-26 NOTE — Anesthesia Postprocedure Evaluation (Addendum)
Anesthesia Post Note  Patient: Meredith Shannon  Procedure(s) Performed: Procedure(s) (LRB): DILATATION & CURETTAGE/HYSTEROSCOPY WITH MYOSURE (N/A)  Patient location during evaluation: PACU Anesthesia Type: General Level of consciousness: awake and sedated Pain management: pain level controlled Vital Signs Assessment: post-procedure vital signs reviewed and stable Respiratory status: spontaneous breathing Cardiovascular status: stable Postop Assessment: no signs of nausea or vomiting Anesthetic complications: no        Last Vitals:  Vitals:   12/26/16 0830 12/26/16 0840  BP:    Pulse: 80   Resp: 16 (P) 16  Temp:      Last Pain:  Vitals:   12/26/16 0830  TempSrc:   PainSc: 5    Pain Goal: Patients Stated Pain Goal: 4 (12/26/16 0830)               Harlei Lehrmann JR,JOHN Mateo Flow

## 2016-12-26 NOTE — Transfer of Care (Signed)
Immediate Anesthesia Transfer of Care Note  Patient: Meredith Shannon  Procedure(s) Performed: Procedure(s): DILATATION & CURETTAGE/HYSTEROSCOPY WITH MYOSURE (N/A)  Patient Location: PACU  Anesthesia Type:General  Level of Consciousness: awake, alert , oriented and patient cooperative  Airway & Oxygen Therapy: Patient Spontanous Breathing and Patient connected to nasal cannula oxygen  Post-op Assessment: Report given to RN and Post -op Vital signs reviewed and stable  Post vital signs: Reviewed and stable  Last Vitals:  Vitals:   12/26/16 0612  BP: (!) 152/79  Pulse: 79  Resp: 18  Temp: 36.6 C    Last Pain:  Vitals:   12/26/16 0612  TempSrc: Oral      Patients Stated Pain Goal: 4 (19/14/78 2956)  Complications: No apparent anesthesia complications

## 2016-12-26 NOTE — Anesthesia Procedure Notes (Signed)
Procedure Name: LMA Insertion Date/Time: 12/26/2016 7:22 AM Performed by: Ignacia Bayley Pre-anesthesia Checklist: Patient identified, Patient being monitored, Emergency Drugs available, Timeout performed and Suction available Patient Re-evaluated:Patient Re-evaluated prior to inductionOxygen Delivery Method: Circle System Utilized Preoxygenation: Pre-oxygenation with 100% oxygen Intubation Type: IV induction Ventilation: Mask ventilation without difficulty LMA: LMA inserted LMA Size: 4.0 Number of attempts: 1 Placement Confirmation: positive ETCO2 and breath sounds checked- equal and bilateral

## 2016-12-26 NOTE — H&P (Signed)
Meredith Shannon is an 61 y.o. female  G2P1 MWF here for hysteroscopy and resection of endometrial polyp with D&C due to 65mm polyp that is present.  Pt has been advised that excision is recommended due to size but this could be watched.  She is desirous of definitive treatment.  Risks, benefits, alternatives have been discussed.  She is here and ready to proceed.  Pertinent Gynecological History: Menses: post-menopausal Bleeding: none Contraception: post menopausal status DES exposure: denies Blood transfusions: none Sexually transmitted diseases: no past history Previous GYN Procedures: none  Last mammogram: normal Date: 11/17 Last pap: normal Date: 10/17 OB History: G2, P1   Menstrual History: Patient's last menstrual period was 08/04/2006.    Past Medical History:  Diagnosis Date  . Broken wrist 2008  . Cancer ([place])    basal cell  . Chest pain 2018   seen by Dr. Ellyn Hack.  Has mid LAD stenosis.  Marland Kitchen GERD (gastroesophageal reflux disease)    occasional uses tums  . High cholesterol    In total cholesterol and triglycerides.  Marland Kitchen HSV-1 infection   . Lichen simplex chronicus     Past Surgical History:  Procedure Laterality Date  . BREAST BIOPSY  5/12   fibrocystic changes  . DILATION AND CURETTAGE OF UTERUS    . FACIAL COSMETIC SURGERY    . HAND SURGERY Right    broken bone  . TUBAL LIGATION  1983    Family History  Problem Relation Age of Onset  . Osteoporosis Mother     Social History:  reports that she quit smoking about 32 years ago. Her smoking use included Cigarettes. She smoked 0.50 packs per day. She has never used smokeless tobacco. She reports that she drinks about 0.6 - 1.2 oz of alcohol per week . She reports that she does not use drugs.  Allergies:  Allergies  Allergen Reactions  . Penicillins Swelling, Rash and Other (See Comments)    Tongue swelling Has patient had a PCN reaction causing immediate rash, facial/tongue/throat swelling, SOB or  lightheadedness with hypotension:Yes Has patient had a PCN reaction causing severe rash involving mucus membranes or skin necrosis:No Has patient had a PCN reaction that required hospitalization:Treated & released in ER--No Has patient had a PCN reaction occurring within the last 10 years:No If all of the above answers are "NO", then may proceed with Cephalosporin use.     Prescriptions Prior to Admission  Medication Sig Dispense Refill Last Dose  . acyclovir (ZOVIRAX) 800 MG tablet Take 800 mg by mouth daily.    12/25/2016 at Unknown time  . Calcium Carb-Cholecalciferol (CALCIUM + D3 PO) Take 1 tablet by mouth daily.   Past Week at Unknown time  . Cholecalciferol (VITAMIN D) 2000 UNITS CAPS Take 2,000 Units by mouth daily.    Past Week at Unknown time  . IBU 600 MG tablet Take 600 mg by mouth every 8 (eight) hours as needed for pain.  0 Past Month at Unknown time  . MAGNESIUM PO Take 1 tablet by mouth daily.    Past Week at Unknown time  . methocarbamol (ROBAXIN) 750 MG tablet Take 750 mg by mouth 2 (two) times daily as needed for muscle spasms.  0 Past Month at Unknown time  . Misc. Devices (VAGINAL SUPPOSITORY APPLICATOR) MISC Place 413 Units vaginally 3 (three) times a week. vitamin E (VITAMIN E) 200 UNIT capsule INSERT 1 VAGINALLY SUPPOSITORY 3 TIMES WEEKLY   Past Week at Unknown time  . nitroGLYCERIN (  NITROSTAT) 0.4 MG SL tablet Place 1 tablet (0.4 mg total) under the tongue every 5 (five) minutes as needed for chest pain. 25 tablet 3 Taking  . rosuvastatin (CRESTOR) 20 MG tablet Take 20 mg by mouth daily.   12/25/2016 at Unknown time  . vitamin C (ASCORBIC ACID) 500 MG tablet Take 500 mg by mouth daily.   Past Week at Unknown time  . vitamin E 1000 UNIT capsule Take 1,000 Units by mouth daily.   Past Week at Unknown time    Review of Systems  All other systems reviewed and are negative.   Blood pressure (!) 152/79, pulse 79, temperature 97.8 F (36.6 C), temperature source Oral,  resp. rate 18, last menstrual period 08/04/2006, SpO2 99 %. Physical Exam  Constitutional: She is oriented to person, place, and time. She appears well-developed and well-nourished.  Cardiovascular: Normal rate and regular rhythm.   Respiratory: Effort normal and breath sounds normal. No respiratory distress.  Neurological: She is alert and oriented to person, place, and time.  Skin: Skin is warm and dry.  Psychiatric: She has a normal mood and affect.    No results found for this or any previous visit (from the past 24 hour(s)).  No results found.  Assessment/Plan: 61 yo G2P1 MWF with endometrial polyp here for hysteroscopy, polyp resection, with D&C.  All questions answered.  Pt ready to proceed.  [name] SUZANNE 12/26/2016, 7:06 AM

## 2016-12-30 ENCOUNTER — Encounter (HOSPITAL_COMMUNITY): Payer: Self-pay | Admitting: Obstetrics & Gynecology

## 2017-01-09 ENCOUNTER — Ambulatory Visit: Payer: BLUE CROSS/BLUE SHIELD | Admitting: Obstetrics & Gynecology

## 2017-01-09 NOTE — Addendum Note (Signed)
Addendum  created 01/09/17 1251 by Lyn Hollingshead, MD   Sign clinical note

## 2017-01-12 ENCOUNTER — Ambulatory Visit (INDEPENDENT_AMBULATORY_CARE_PROVIDER_SITE_OTHER): Payer: BLUE CROSS/BLUE SHIELD | Admitting: Obstetrics & Gynecology

## 2017-01-12 VITALS — BP 130/70 | HR 88 | Resp 14 | Ht 70.5 in | Wt 218.0 lb

## 2017-01-12 DIAGNOSIS — N8501 Benign endometrial hyperplasia: Secondary | ICD-10-CM | POA: Diagnosis not present

## 2017-01-12 NOTE — Progress Notes (Signed)
Post Operative Visit  Procedure: DILATATION & CURETTAGE/HYSTEROSCOPY WITH MYOSURE Days Post-op: 17  Subjective: Doing well since procedure.  Minimal bleeding.  Denies pain.  Took only one dosage of pain medication.    Reviewed pathology with pt.  Polyp with endometrial hyperplasia both simple and complex without atypica noted.  Treatment options of IUD vs oral progesterones with follow up endometrial biopsies and hysterectomy discussed.  Questions about follow-up and future risks answered.  Pt feel she is leaning towards hysterectomy but not completely sure yet so wants to discuss with spouse.  Questions answered.  Not sure she wants to worry about this long term.  Objective: BP 130/70 (BP Location: Right Arm, Patient Position: Sitting, Cuff Size: Large)   Pulse 88   Resp 14   Ht 5' 10.5" (1.791 m)   Wt 218 lb (98.9 kg)   LMP 08/04/2006   BMI 30.84 kg/m   EXAM General: alert and cooperative Resp: clear to auscultation bilaterally Cardio: regular rate and rhythm, S1, S2 normal, no murmur, click, rub or gallop GI: soft, non-tender; bowel sounds normal; no masses,  no organomegaly Extremities: extremities normal, atraumatic, no cyanosis or edema Vaginal Bleeding: none  Gyn:  NAEFG, vaginal pink and moist without lesions, cervix close, no CMT  Assessment: s/p hysteroscopy with polyp resection, D&C Simple and complex endometrial hyperplasia without atypia in the polyp  Plan: Treatment options reviewed today including Mirena IUD and oral progesterone with endometrial biopsy follow-up or hysterectomy.  She is leaning towards hysterectomy but wants to consider.  Will be contacted in a few days for planning purposes.  All questions answered.    ~15 minutes spent with patient >50% of time was in face to face discussion of above.

## 2017-01-15 ENCOUNTER — Encounter: Payer: Self-pay | Admitting: Obstetrics & Gynecology

## 2017-01-16 ENCOUNTER — Telehealth: Payer: Self-pay | Admitting: *Deleted

## 2017-01-16 NOTE — Telephone Encounter (Signed)
Call to patient to review surgery date options. Patient planning date in August. Discussed 03-09-17. Advised will review with Dr Sabra Heck and schedule. Once confirmed, will call back.

## 2017-01-22 NOTE — Telephone Encounter (Signed)
Called patient, per [name], and informed her that her upcoming surgery date is set for 03/09/17. [name] will follow up with more details early next week. Patient appreciative.

## 2017-02-09 NOTE — Telephone Encounter (Signed)
Call to patient to review surgery instructions and schedule appointments. Left message to call back and ask for [facility]

## 2017-02-10 NOTE — Telephone Encounter (Signed)
Call to patient. Surgery information form reviewed with patient and she verbalized understanding. Pre op and post op appointments made and patient agreeable to date and time of all appointments. Patient aware surgery information form will be mailed to her.   Routing to provider for final review. Patient agreeable to disposition. Will close encounter.   Meredith Shannon

## 2017-02-10 NOTE — Telephone Encounter (Signed)
Spoke with patient regarding benefit for surgical procedure, scheduled on 03/09/17. Patient understood and agreeable. Patient aware this is professional benefit only. Patient aware will be contacted by hospital for separate benefits. Patient is aware of surgery cancellation policy.   Advised patient our Nurse Supervisor, [name] will contact her to  Review surgery instructions, as she was not available at the time of our phone conversation.   Routing to Lamont Snowball, RN

## 2017-02-12 ENCOUNTER — Other Ambulatory Visit: Payer: Self-pay | Admitting: Obstetrics & Gynecology

## 2017-02-24 NOTE — Progress Notes (Signed)
61 y.o. 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  We have previously reviewed that this is likely all treated but risks for future malignancy have been discussed.  Treatment options with Mirena IUD or oral progesterones and follow-up endometrial biopsies have been discussed.  As well, definitive treatment with hysterectomy were discussed.  Reviewed all of this with pt today.  She has decided to proceed with definitive treatment.  She needed to discuss with her husband after last visit before making final decision.  We have not reviewed surgery, risks/benefits, post op instructions at this point.  Pt is here for this today as well.  Procedure discussed with patient.  Hospital stay, recovery and pain management all discussed.  Risks discussed including but not limited to bleeding, 1% risk of receiving a  transfusion, infection, 3-4% risk of bowel/bladder/ureteral/vascular injury discussed as well as possible need for additional surgery if injury does occur discussed.  DVT/PE and rare risk of death discussed.  My actual complications with prior surgeries discussed.  Vaginal cuff dehiscence discussed.  Hernia formation discussed.  Positioning and incision locations discussed.  Patient aware if pathology abnormal she may need additional treatment.  All questions answered.    She is desirous of removal of both ovaries as well.  She is sure of this decision.  We discussed that keeping her ovaries for the next 5-ish years may convey some cardiovascular benefit but that benefit does not seem to exist further than age 63.  Ultrasound performed 08/28/16 showed uterus measuring 6.0 x 4 x 3cm.  Ovaries were small and atrophic.  Ob Hx:   Patient's last menstrual period was 08/04/2006.          Sexually active: No. Birth control: post menopausal status  Last pap: 06/03/16 negative, HR HPV negative  Last MMG: 06/24/16  BIRADS 1 negative  Tobacco: Former smoker   Past Surgical History:  Procedure Laterality Date  . BREAST BIOPSY  5/12   fibrocystic changes  . DILATATION & CURETTAGE/HYSTEROSCOPY WITH MYOSURE N/A 12/26/2016   Procedure: [place];  Surgeon: Megan Salon, MD;  Location: [facility];  Service: Gynecology;  Laterality: N/A;  . DILATION AND CURETTAGE OF UTERUS    . FACIAL COSMETIC SURGERY    . HAND SURGERY Right    broken bone  . TUBAL LIGATION  1983    Past Medical History:  Diagnosis Date  . Broken wrist 2008  . Cancer ([place])    basal cell  . Chest pain 2018   seen by Dr. Ellyn Hack.  Has mid LAD stenosis.  Marland Kitchen GERD (gastroesophageal reflux disease)    occasional uses tums  . High cholesterol    In total cholesterol and triglycerides.  Marland Kitchen HSV-1 infection   . Lichen simplex chronicus     Allergies: Penicillins  Current Outpatient Prescriptions  Medication Sig Dispense Refill  . acyclovir (ZOVIRAX) 800 MG tablet Take 800 mg by mouth daily.     . Calcium Carb-Cholecalciferol (CALCIUM + D3 PO) Take 1 tablet by mouth daily.    . Cholecalciferol (VITAMIN D) 2000 UNITS CAPS Take 2,000 Units by mouth daily.     Marland Kitchen MAGNESIUM PO Take 1 tablet by mouth daily.     . Misc. Devices (VAGINAL SUPPOSITORY APPLICATOR) MISC Place 465 Units vaginally 3 (three) times a week. vitamin E (VITAMIN E) 200 UNIT capsule INSERT 1 VAGINALLY SUPPOSITORY 3 TIMES WEEKLY    .  nitroGLYCERIN (NITROSTAT) 0.4 MG SL tablet Place 1 tablet (0.4 mg total) under the tongue every 5 (five) minutes as needed for chest pain. 25 tablet 3  . rosuvastatin (CRESTOR) 20 MG tablet Take 20 mg by mouth daily.    . vitamin C (ASCORBIC ACID) 500 MG tablet Take 500 mg by mouth daily.    . vitamin E 1000 UNIT capsule Take 1,000 Units by mouth daily.     No current facility-administered medications for this visit.     ROS: A comprehensive review of systems was negative.  Exam:    LMP 08/04/2006    General appearance: alert and cooperative Head: Normocephalic, without obvious abnormality, atraumatic Neck: no adenopathy, supple, symmetrical, trachea midline and thyroid not enlarged, symmetric, no tenderness/mass/nodules Lungs: clear to auscultation bilaterally Heart: regular rate and rhythm, S1, S2 normal, no murmur, click, rub or gallop Abdomen: soft, non-tender; bowel sounds normal; no masses,  no organomegaly Extremities: extremities normal, atraumatic, no cyanosis or edema Skin: Skin color, texture, turgor normal. No rashes or lesions Lymph nodes: Cervical, supraclavicular, and axillary nodes normal. no inguinal nodes palpated Neurologic: Grossly normal  Pelvic: External genitalia:  no lesions              Urethra: normal appearing urethra with no masses, tenderness or lesions              Bartholins and Skenes: normal                 Vagina: normal appearing vagina with normal color and discharge, no lesions, atrophic              Cervix: normal appearance              Pap taken: No.        Bimanual Exam:  Uterus:  uterus is normal size, shape, consistency and nontender                                      Adnexa:    normal adnexa in size, nontender and no masses                                      Rectovaginal: Deferred                                      Anus:  normal sphincter tone, no lesions  A: Simple and complex endometrium in endometrial polyp     P:  TLH/BSO/cystoscopy planned Rx for Motrin and Percocet given. Will confirm with cardiology that no additional testing is needed. Medications/Vitamins reviewed.  Hysterectomy brochure given for pre and post op instructions.  ~25 minutes spent with patient >50% of time was in face to face discussion of above.

## 2017-02-26 ENCOUNTER — Telehealth: Payer: Self-pay | Admitting: *Deleted

## 2017-02-26 ENCOUNTER — Ambulatory Visit (INDEPENDENT_AMBULATORY_CARE_PROVIDER_SITE_OTHER): Payer: BLUE CROSS/BLUE SHIELD | Admitting: Obstetrics & Gynecology

## 2017-02-26 ENCOUNTER — Encounter: Payer: Self-pay | Admitting: Obstetrics & Gynecology

## 2017-02-26 VITALS — BP 134/76 | HR 80 | Ht 70.5 in | Wt 211.0 lb

## 2017-02-26 DIAGNOSIS — N8501 Benign endometrial hyperplasia: Secondary | ICD-10-CM | POA: Diagnosis not present

## 2017-02-26 MED ORDER — NONFORMULARY OR COMPOUNDED ITEM: 4 refills | Status: DC

## 2017-02-26 MED ORDER — IBUPROFEN 800 MG PO TABS: 800.0000 mg | ORAL_TABLET | Freq: Three times a day (TID) | ORAL | 0 refills | Status: DC | PRN

## 2017-02-26 MED ORDER — OXYCODONE-ACETAMINOPHEN 5-325 MG PO TABS: 1.0000 | ORAL_TABLET | Freq: Four times a day (QID) | ORAL | 0 refills | Status: DC | PRN

## 2017-02-26 NOTE — Telephone Encounter (Signed)
Medication refill request: Vit E suppositories  Last AEX:  06/03/16 SM Next AEX: 09/24/17 SM  Last MMG (if hormonal medication request): 06/24/16 BIRADS1:neg  Refill authorized: please advise.

## 2017-02-26 NOTE — Telephone Encounter (Signed)
Rx written and on your desk to fax.  Thanks.

## 2017-02-27 ENCOUNTER — Telehealth: Payer: Self-pay | Admitting: *Deleted

## 2017-02-27 ENCOUNTER — Telehealth: Payer: Self-pay | Admitting: Cardiology

## 2017-02-27 NOTE — Telephone Encounter (Signed)
Routed to MD for review.

## 2017-02-27 NOTE — Telephone Encounter (Signed)
Rx faxed to [facility] 

## 2017-02-27 NOTE — Telephone Encounter (Signed)
Phone call to [facility] to confirm patient approved for surgery at their facility. Reviewed patient history cand cardiac work-up. They will review and call back.  Call to [facility], Dr Allison Quarry office. Spoke with Romie Minus and left message to confirm cardiac clearance for TLH on 03-09-17.

## 2017-02-27 NOTE — Telephone Encounter (Signed)
New message        [facility] Pre-operative Risk Assessment    Request for surgical clearance:  1. What type of surgery is being performed? hystrorectomy -laproscopic  2. When is this surgery scheduled?  August 6th  3. Are there any medications that need to be held prior to surgery and how long? none  4. Name of physician performing surgery? Dr Edwinna Areola   5. What is your office phone and fax number? Fax [phone] [facility] [place] 02/27/2017, 11:34 AM  _________________________________________________________________   (provider comments below)

## 2017-03-02 NOTE — Telephone Encounter (Signed)
Low Risk patient for Hysteroscopy - Coronary CTA with no significant disease.  Glenetta Hew, MD

## 2017-03-02 NOTE — Telephone Encounter (Signed)
Follow up    [name] from [facility] is calling to follow up on this. Please call.

## 2017-03-02 NOTE — Patient Instructions (Addendum)
Meredith Shannon  03/02/2017      Your procedure is scheduled on 03/09/2017    Report to [place].M.  Call this number if you have problems the morning of surgery:[phone]             OUR ADDRESS IS [address] , WE ARE LOCATED IN [place].    Remember:  Do not eat food or drink liquids after midnight.  Take these medicines the morning of surgery with A SIP OF WATER  None   Do not wear jewelry, make-up or nail polish.  Do not wear lotions, powders, or perfumes, or deoderant.  Do not shave 48 hours prior to surgery.  Men may shave face and neck.  Do not bring valuables to the hospital.  [facility] is not responsible for any belongings or valuables.  Contacts, dentures or bridgework may not be worn into surgery.  Leave your suitcase in the car.  After surgery it may be brought to your room.  For patients admitted to the hospital, discharge time will be determined by your treatment team.   Special instructions:   Please read over the following fact sheets that you were given.    [facility] - Preparing for Surgery Before surgery, you can play an important role.  Because skin is not sterile, your skin needs to be as free of germs as possible.  You can reduce the number of germs on your skin by washing with CHG (chlorahexidine gluconate) soap before surgery.  CHG is an antiseptic cleaner which kills germs and bonds with the skin to continue killing germs even after washing. Please DO NOT use if you have an allergy to CHG or antibacterial soaps.  If your skin becomes reddened/irritated stop using the CHG and inform your nurse when you arrive at Short Stay. Do not shave (including legs and underarms) for at least 48 hours prior to the first CHG shower.  You may shave your face/neck. Please follow these instructions carefully:  1.  Shower with CHG Soap the night before surgery and the  morning of  Surgery.  2.  If you choose to wash your hair, wash your hair first as usual with your  normal  shampoo.  3.  After you shampoo, rinse your hair and body thoroughly to remove the  shampoo.                           4.  Use CHG as you would any other liquid soap.  You can apply chg directly  to the skin and wash                       Gently with a scrungie or clean washcloth.  5.  Apply the CHG Soap to your body ONLY FROM THE NECK DOWN.   Do not use on face/ open                           Wound or open sores. Avoid contact with eyes, ears mouth and genitals (private parts).  Wash face,  Genitals (private parts) with your normal soap.             6.  Wash thoroughly, paying special attention to the area where your surgery  will be performed.  7.  Thoroughly rinse your body with warm water from the neck down.  8.  DO NOT shower/wash with your normal soap after using and rinsing off  the CHG Soap.                9.  Pat yourself dry with a clean towel.            10.  Wear clean pajamas.            11.  Place clean sheets on your bed the night of your first shower and do not  sleep with pets. Day of Surgery : Do not apply any lotions/deodorants the morning of surgery.  Please wear clean clothes to the hospital/surgery center.  FAILURE TO FOLLOW THESE INSTRUCTIONS MAY RESULT IN THE CANCELLATION OF YOUR SURGERY PATIENT SIGNATURE_________________________________  NURSE SIGNATURE__________________________________  ________________________________________________________________________

## 2017-03-02 NOTE — Telephone Encounter (Signed)
Call back to [facility] for update on surgical clearance. Left message with Caryl Pina.

## 2017-03-03 ENCOUNTER — Encounter (HOSPITAL_COMMUNITY)
Admission: RE | Admit: 2017-03-03 | Discharge: 2017-03-03 | Disposition: A | Discharge status: Home / Self Care | Payer: BLUE CROSS/BLUE SHIELD | Source: Ambulatory Visit | Attending: Obstetrics & Gynecology | Admitting: Obstetrics & Gynecology

## 2017-03-03 ENCOUNTER — Encounter (HOSPITAL_COMMUNITY): Payer: Self-pay

## 2017-03-03 DIAGNOSIS — Z0183 Encounter for blood typing: Secondary | ICD-10-CM | POA: Insufficient documentation

## 2017-03-03 DIAGNOSIS — N8501 Benign endometrial hyperplasia: Secondary | ICD-10-CM | POA: Diagnosis not present

## 2017-03-03 DIAGNOSIS — Z01812 Encounter for preprocedural laboratory examination: Secondary | ICD-10-CM | POA: Diagnosis present

## 2017-03-03 HISTORY — DX: Adverse effect of unspecified anesthetic, initial encounter: T41.45XA

## 2017-03-03 HISTORY — DX: Other complications of anesthesia, initial encounter: T88.59XA

## 2017-03-03 LAB — CBC
HEMATOCRIT: 41.7 % (ref 36.0–46.0)
Hemoglobin: 14.7 g/dL (ref 12.0–15.0)
MCH: 31.7 pg (ref 26.0–34.0)
MCHC: 35.3 g/dL (ref 30.0–36.0)
MCV: 89.9 fL (ref 78.0–100.0)
PLATELETS: 193 10*3/uL (ref 150–400)
RBC: 4.64 MIL/uL (ref 3.87–5.11)
RDW: 11.9 % (ref 11.5–15.5)
WBC: 6.5 10*3/uL (ref 4.0–10.5)

## 2017-03-03 LAB — BASIC METABOLIC PANEL
Anion gap: 8 (ref 5–15)
BUN: 25 mg/dL — AB (ref 6–20)
CHLORIDE: 105 mmol/L (ref 101–111)
CO2: 27 mmol/L (ref 22–32)
CREATININE: 0.96 mg/dL (ref 0.44–1.00)
Calcium: 9.8 mg/dL (ref 8.9–10.3)
GFR calc Af Amer: >60 mL/min (ref >60)
GFR calc non Af Amer: >60 mL/min (ref >60)
GLUCOSE: 94 mg/dL (ref 65–99)
Potassium: 4.4 mmol/L (ref 3.5–5.1)
SODIUM: 140 mmol/L (ref 135–145)

## 2017-03-03 LAB — ABO/RH: ABO/RH(D): A NEG

## 2017-03-03 NOTE — Telephone Encounter (Signed)
Letter for surgical clearance receive from cardiology. Faxed to [facility]. Sent to your office for final review.  Routing to provider for final review. Will close encounter.

## 2017-03-03 NOTE — Telephone Encounter (Signed)
Faxed to the # provided via Epic

## 2017-03-08 NOTE — Anesthesia Preprocedure Evaluation (Addendum)
Anesthesia Evaluation  Patient identified by MRN, date of birth, ID band Patient awake    Reviewed: Allergy & Precautions, NPO status , Patient's Chart, lab work & pertinent test results  Airway Mallampati: II  TM Distance: >3 FB Neck ROM: Full    Dental no notable dental hx. (+) Teeth Intact, Dental Advisory Given   Pulmonary neg pulmonary ROS, former smoker,    breath sounds clear to auscultation       Cardiovascular  Rhythm:Regular Rate:Normal     Neuro/Psych    GI/Hepatic Neg liver ROS, GERD  Medicated and Controlled,  Endo/Other  negative endocrine ROS  Renal/GU negative Renal ROS   Complex Endometrial Hyperplasia    Musculoskeletal   Abdominal   Peds  Hematology negative hematology ROS (+)   Anesthesia Other Findings   Reproductive/Obstetrics                          Anesthesia Physical Anesthesia Plan  ASA: II  Anesthesia Plan: General   Post-op Pain Management:    Induction: Intravenous  PONV Risk Score and Plan: 4 or greater and Ondansetron, Dexamethasone, Midazolam, Scopolamine patch - Pre-op, Propofol infusion and Treatment may vary due to age or medical condition  Airway Management Planned: LMA  Additional Equipment:   Intra-op Plan:   Post-operative Plan:   Informed Consent: I have reviewed the patients History and Physical, chart, labs and discussed the procedure including the risks, benefits and alternatives for the proposed anesthesia with the patient or authorized representative who has indicated his/her understanding and acceptance.     Plan Discussed with: CRNA  Anesthesia Plan Comments:         Anesthesia Quick Evaluation

## 2017-03-09 ENCOUNTER — Ambulatory Visit (HOSPITAL_BASED_OUTPATIENT_CLINIC_OR_DEPARTMENT_OTHER)
Admission: RE | Admit: 2017-03-09 | Discharge: 2017-03-10 | Disposition: A | Discharge status: Home / Self Care | Payer: BLUE CROSS/BLUE SHIELD | Source: Ambulatory Visit | Attending: Obstetrics & Gynecology | Admitting: Obstetrics & Gynecology

## 2017-03-09 ENCOUNTER — Ambulatory Visit (HOSPITAL_BASED_OUTPATIENT_CLINIC_OR_DEPARTMENT_OTHER): Payer: BLUE CROSS/BLUE SHIELD | Admitting: Anesthesiology

## 2017-03-09 ENCOUNTER — Encounter (HOSPITAL_BASED_OUTPATIENT_CLINIC_OR_DEPARTMENT_OTHER): Payer: Self-pay | Admitting: *Deleted

## 2017-03-09 ENCOUNTER — Encounter (HOSPITAL_COMMUNITY)
Admission: RE | Disposition: A | Discharge status: Home / Self Care | Payer: Self-pay | Source: Ambulatory Visit | Attending: Obstetrics & Gynecology

## 2017-03-09 DIAGNOSIS — Z880 Allergy status to penicillin: Secondary | ICD-10-CM | POA: Diagnosis not present

## 2017-03-09 DIAGNOSIS — I2510 Atherosclerotic heart disease of native coronary artery without angina pectoris: Secondary | ICD-10-CM | POA: Insufficient documentation

## 2017-03-09 DIAGNOSIS — D259 Leiomyoma of uterus, unspecified: Secondary | ICD-10-CM | POA: Diagnosis not present

## 2017-03-09 DIAGNOSIS — Z79899 Other long term (current) drug therapy: Secondary | ICD-10-CM | POA: Insufficient documentation

## 2017-03-09 DIAGNOSIS — K219 Gastro-esophageal reflux disease without esophagitis: Secondary | ICD-10-CM | POA: Insufficient documentation

## 2017-03-09 DIAGNOSIS — Z87891 Personal history of nicotine dependence: Secondary | ICD-10-CM | POA: Insufficient documentation

## 2017-03-09 DIAGNOSIS — N8501 Benign endometrial hyperplasia: Secondary | ICD-10-CM | POA: Diagnosis not present

## 2017-03-09 DIAGNOSIS — N800 Endometriosis of uterus: Secondary | ICD-10-CM | POA: Insufficient documentation

## 2017-03-09 DIAGNOSIS — E7800 Pure hypercholesterolemia, unspecified: Secondary | ICD-10-CM | POA: Insufficient documentation

## 2017-03-09 HISTORY — PX: LAPAROSCOPIC HYSTERECTOMY: SHX1926

## 2017-03-09 HISTORY — PX: CYSTOSCOPY: SHX5120

## 2017-03-09 HISTORY — PX: LAPAROSCOPIC SALPINGO OOPHERECTOMY: SHX5927

## 2017-03-09 LAB — TYPE AND SCREEN
ABO/RH(D): A NEG
Antibody Screen: NEGATIVE

## 2017-03-09 SURGERY — HYSTERECTOMY, TOTAL, LAPAROSCOPIC
Anesthesia: General

## 2017-03-09 MED ORDER — MIDAZOLAM HCL 2 MG/2ML IJ SOLN
INTRAMUSCULAR | Status: AC
  Filled 2017-03-09: qty 2

## 2017-03-09 MED ORDER — DEXTROSE 5 % IV SOLN
5.0000 mg/kg | INTRAVENOUS | Status: AC
  Administered 2017-03-09: 490 mg via INTRAVENOUS
  Filled 2017-03-09 (×2): qty 12.25

## 2017-03-09 MED ORDER — DEXAMETHASONE SODIUM PHOSPHATE 10 MG/ML IJ SOLN
INTRAMUSCULAR | Status: AC
  Filled 2017-03-09: qty 1

## 2017-03-09 MED ORDER — MENTHOL 3 MG MT LOZG: 1.0000 | LOZENGE | OROMUCOSAL | Status: DC | PRN

## 2017-03-09 MED ORDER — METRONIDAZOLE IN NACL 5-0.79 MG/ML-% IV SOLN
500.0000 mg | INTRAVENOUS | Status: AC
  Administered 2017-03-09: 500 mg via INTRAVENOUS
  Filled 2017-03-09 (×2): qty 100

## 2017-03-09 MED ORDER — ACETAMINOPHEN 10 MG/ML IV SOLN
INTRAVENOUS | Status: AC
  Filled 2017-03-09: qty 100

## 2017-03-09 MED ORDER — ONDANSETRON HCL 4 MG/2ML IJ SOLN
INTRAMUSCULAR | Status: AC
  Filled 2017-03-09: qty 2

## 2017-03-09 MED ORDER — DEXAMETHASONE SODIUM PHOSPHATE 10 MG/ML IJ SOLN
INTRAMUSCULAR | Status: DC | PRN
  Administered 2017-03-09: 10 mg via INTRAVENOUS

## 2017-03-09 MED ORDER — KETOROLAC TROMETHAMINE 30 MG/ML IJ SOLN
INTRAMUSCULAR | Status: DC | PRN
  Administered 2017-03-09: 15 mg via INTRAVENOUS

## 2017-03-09 MED ORDER — KETOROLAC TROMETHAMINE 30 MG/ML IJ SOLN
15.0000 mg | Freq: Four times a day (QID) | INTRAMUSCULAR | Status: DC
  Administered 2017-03-09 – 2017-03-10 (×3): 15 mg via INTRAVENOUS
  Filled 2017-03-09 (×3): qty 1

## 2017-03-09 MED ORDER — ALUM & MAG HYDROXIDE-SIMETH 200-200-20 MG/5ML PO SUSP: 30.0000 mL | ORAL | Status: DC | PRN

## 2017-03-09 MED ORDER — ROCURONIUM BROMIDE 10 MG/ML (PF) SYRINGE
PREFILLED_SYRINGE | INTRAVENOUS | Status: DC | PRN
  Administered 2017-03-09: 50 mg via INTRAVENOUS
  Administered 2017-03-09: 5 mg via INTRAVENOUS
  Administered 2017-03-09: 10 mg via INTRAVENOUS

## 2017-03-09 MED ORDER — ONDANSETRON HCL 4 MG/2ML IJ SOLN
INTRAMUSCULAR | Status: DC | PRN
  Administered 2017-03-09: 4 mg via INTRAVENOUS

## 2017-03-09 MED ORDER — FENTANYL CITRATE (PF) 100 MCG/2ML IJ SOLN
INTRAMUSCULAR | Status: DC | PRN
  Administered 2017-03-09: 100 ug via INTRAVENOUS
  Administered 2017-03-09: 50 ug via INTRAVENOUS

## 2017-03-09 MED ORDER — DEXTROSE-NACL 5-0.45 % IV SOLN: INTRAVENOUS | Status: DC

## 2017-03-09 MED ORDER — HYDROMORPHONE HCL-NACL 0.5-0.9 MG/ML-% IV SOSY
PREFILLED_SYRINGE | INTRAVENOUS | Status: AC
  Filled 2017-03-09: qty 1

## 2017-03-09 MED ORDER — OXYCODONE-ACETAMINOPHEN 5-325 MG PO TABS: 1.0000 | ORAL_TABLET | ORAL | Status: DC | PRN

## 2017-03-09 MED ORDER — KETOROLAC TROMETHAMINE 30 MG/ML IJ SOLN
INTRAMUSCULAR | Status: AC
  Filled 2017-03-09: qty 1

## 2017-03-09 MED ORDER — SUGAMMADEX SODIUM 200 MG/2ML IV SOLN
INTRAVENOUS | Status: DC | PRN
  Administered 2017-03-09: 200 mg via INTRAVENOUS

## 2017-03-09 MED ORDER — ENOXAPARIN SODIUM 40 MG/0.4ML ~~LOC~~ SOLN
40.0000 mg | SUBCUTANEOUS | Status: AC
  Administered 2017-03-09: 40 mg via SUBCUTANEOUS
  Filled 2017-03-09: qty 0.4

## 2017-03-09 MED ORDER — SUGAMMADEX SODIUM 200 MG/2ML IV SOLN
INTRAVENOUS | Status: AC
  Filled 2017-03-09: qty 2

## 2017-03-09 MED ORDER — ARTIFICIAL TEARS OPHTHALMIC OINT
TOPICAL_OINTMENT | OPHTHALMIC | Status: AC
  Filled 2017-03-09: qty 3.5

## 2017-03-09 MED ORDER — PROMETHAZINE HCL 25 MG/ML IJ SOLN
6.2500 mg | INTRAMUSCULAR | Status: DC | PRN
  Filled 2017-03-09: qty 1

## 2017-03-09 MED ORDER — PROPOFOL 10 MG/ML IV BOLUS
INTRAVENOUS | Status: DC | PRN
  Administered 2017-03-09: 200 mg via INTRAVENOUS

## 2017-03-09 MED ORDER — HEMOSTATIC AGENTS (NO CHARGE) OPTIME
TOPICAL | Status: DC | PRN
  Administered 2017-03-09: 1 via TOPICAL

## 2017-03-09 MED ORDER — MIDAZOLAM HCL 5 MG/5ML IJ SOLN
INTRAMUSCULAR | Status: DC | PRN
  Administered 2017-03-09: 2 mg via INTRAVENOUS

## 2017-03-09 MED ORDER — FENTANYL CITRATE (PF) 250 MCG/5ML IJ SOLN
INTRAMUSCULAR | Status: AC
  Filled 2017-03-09: qty 5

## 2017-03-09 MED ORDER — ACETAMINOPHEN 325 MG PO TABS: 650.0000 mg | ORAL_TABLET | ORAL | Status: DC | PRN

## 2017-03-09 MED ORDER — HYDROMORPHONE HCL 1 MG/ML IJ SOLN
0.2500 mg | INTRAMUSCULAR | Status: DC | PRN
  Administered 2017-03-09: 0.25 mg via INTRAVENOUS
  Filled 2017-03-09: qty 0.5

## 2017-03-09 MED ORDER — ENOXAPARIN SODIUM 40 MG/0.4ML ~~LOC~~ SOLN
SUBCUTANEOUS | Status: AC
  Filled 2017-03-09: qty 0.4

## 2017-03-09 MED ORDER — SODIUM CHLORIDE 0.9 % IV SOLN
INTRAVENOUS | Status: DC | PRN
  Administered 2017-03-09: 30 mL

## 2017-03-09 MED ORDER — PANTOPRAZOLE SODIUM 40 MG IV SOLR
40.0000 mg | Freq: Every day | INTRAVENOUS | Status: DC
  Administered 2017-03-09: 40 mg via INTRAVENOUS
  Filled 2017-03-09: qty 40

## 2017-03-09 MED ORDER — SIMETHICONE 80 MG PO CHEW: 80.0000 mg | CHEWABLE_TABLET | Freq: Four times a day (QID) | ORAL | Status: DC | PRN

## 2017-03-09 MED ORDER — LACTATED RINGERS IV SOLN
INTRAVENOUS | Status: DC
  Administered 2017-03-09 (×2): via INTRAVENOUS
  Filled 2017-03-09: qty 1000

## 2017-03-09 MED ORDER — ROCURONIUM BROMIDE 50 MG/5ML IV SOSY
PREFILLED_SYRINGE | INTRAVENOUS | Status: AC
  Filled 2017-03-09: qty 10

## 2017-03-09 MED ORDER — ACETAMINOPHEN 10 MG/ML IV SOLN
INTRAVENOUS | Status: DC | PRN
  Administered 2017-03-09: 1000 mg via INTRAVENOUS

## 2017-03-09 MED ORDER — MORPHINE SULFATE (PF) 2 MG/ML IV SOLN
1.0000 mg | INTRAVENOUS | Status: DC | PRN
  Filled 2017-03-09: qty 1

## 2017-03-09 MED ORDER — KETOROLAC TROMETHAMINE 30 MG/ML IJ SOLN
30.0000 mg | Freq: Four times a day (QID) | INTRAMUSCULAR | Status: DC
  Filled 2017-03-09: qty 1

## 2017-03-09 MED ORDER — HYDROMORPHONE HCL 1 MG/ML IJ SOLN
0.2500 mg | INTRAMUSCULAR | Status: DC | PRN
  Filled 2017-03-09: qty 0.5

## 2017-03-09 SURGICAL SUPPLY — 56 items
ADH SKN CLS APL DERMABOND .7 (GAUZE/BANDAGES/DRESSINGS) ×8
APL SRG 38 LTWT LNG FL B (MISCELLANEOUS) ×2
APPLICATOR ARISTA FLEXITIP XL (MISCELLANEOUS) ×2 IMPLANT
CABLE HIGH FREQUENCY MONO STRZ (ELECTRODE) ×2 IMPLANT
CLOTH BEACON ORANGE TIMEOUT ST (SAFETY) ×4 IMPLANT
COVER BACK TABLE 80X110 HD (DRAPES) ×4 IMPLANT
COVER LIGHT HANDLE  1/PK (MISCELLANEOUS) ×2
COVER LIGHT HANDLE 1/PK (MISCELLANEOUS) IMPLANT
COVER MAYO STAND STRL (DRAPES) ×4 IMPLANT
DERMABOND ADVANCED (GAUZE/BANDAGES/DRESSINGS) ×8
DERMABOND ADVANCED .7 DNX12 (GAUZE/BANDAGES/DRESSINGS) IMPLANT
DILATOR CANAL MILEX (MISCELLANEOUS) IMPLANT
DURAPREP 26ML APPLICATOR (WOUND CARE) ×4 IMPLANT
GLOVE BIOGEL PI IND STRL 7.0 (GLOVE) ×4 IMPLANT
GLOVE BIOGEL PI INDICATOR 7.0 (GLOVE) ×4
GLOVE ECLIPSE 6.5 STRL STRAW (GLOVE) ×8 IMPLANT
GOWN STRL REUS W/TWL XL LVL3 (GOWN DISPOSABLE) ×8 IMPLANT
GYRUS RUMI II 2.5CM BLUE (DISPOSABLE) ×4
HEMOSTAT ARISTA ABSORB 3G PWDR (MISCELLANEOUS) ×2 IMPLANT
LIGASURE VESSEL 5MM BLUNT TIP (ELECTROSURGICAL) ×4 IMPLANT
NEEDLE INSUFFLATION 120MM (ENDOMECHANICALS) ×4 IMPLANT
NS IRRIG 1000ML POUR BTL (IV SOLUTION) ×4 IMPLANT
OCCLUDER COLPOPNEUMO (BALLOONS) ×4 IMPLANT
PACK LAPAROSCOPY BASIN (CUSTOM PROCEDURE TRAY) ×4 IMPLANT
PACK TRENDGUARD 450 HYBRID PRO (MISCELLANEOUS) ×2 IMPLANT
POUCH LAPAROSCOPIC INSTRUMENT (MISCELLANEOUS) ×4 IMPLANT
PROTECTOR NERVE ULNAR (MISCELLANEOUS) ×10 IMPLANT
RUMI 2.5 ×2 IMPLANT
RUMI II GYRUS 2.5CM BLUE (DISPOSABLE) IMPLANT
SCISSORS LAP 5X35 DISP (ENDOMECHANICALS) ×4 IMPLANT
SET IRRIG TUBING LAPAROSCOPIC (IRRIGATION / IRRIGATOR) ×4 IMPLANT
SET IRRIG Y TYPE TUR BLADDER L (SET/KITS/TRAYS/PACK) ×2 IMPLANT
SET TRI-LUMEN FLTR TB AIRSEAL (TUBING) ×4 IMPLANT
SHEARS HARMONIC ACE PLUS 36CM (ENDOMECHANICALS) ×4 IMPLANT
SOLUTION ELECTROLUBE (MISCELLANEOUS) ×4 IMPLANT
SUT VIC AB 0 CT1 27 (SUTURE) ×8
SUT VIC AB 0 CT1 27XBRD ANBCTR (SUTURE) ×4 IMPLANT
SUT VICRYL 0 UR6 27IN ABS (SUTURE) IMPLANT
SUT VICRYL 4-0 PS2 18IN ABS (SUTURE) ×4 IMPLANT
SUT VLOC 180 0 9IN  GS21 (SUTURE) ×2
SUT VLOC 180 0 9IN GS21 (SUTURE) ×2 IMPLANT
SYR 50ML LL SCALE MARK (SYRINGE) ×8 IMPLANT
SYRINGE 10CC LL (SYRINGE) ×4 IMPLANT
SYSTEM CARTER THOMASON II (TROCAR) IMPLANT
TIP UTERINE 5.1X6CM LAV DISP (MISCELLANEOUS) ×2 IMPLANT
TIP UTERINE 6.7X10CM GRN DISP (MISCELLANEOUS) IMPLANT
TIP UTERINE 6.7X6CM WHT DISP (MISCELLANEOUS) IMPLANT
TIP UTERINE 6.7X8CM BLUE DISP (MISCELLANEOUS) IMPLANT
TOWEL OR 17X24 6PK STRL BLUE (TOWEL DISPOSABLE) ×8 IMPLANT
TRAY FOLEY CATH SILVER 14FR (SET/KITS/TRAYS/PACK) ×4 IMPLANT
TRENDGUARD 450 HYBRID PRO PACK (MISCELLANEOUS) ×4
TROCAR ADV FIXATION 5X100MM (TROCAR) ×4 IMPLANT
TROCAR PORT AIRSEAL 5X120 (TROCAR) ×4 IMPLANT
TROCAR XCEL NON BLADE 8MM B8LT (ENDOMECHANICALS) ×4 IMPLANT
TROCAR XCEL NON-BLD 5MMX100MML (ENDOMECHANICALS) ×4 IMPLANT
WARMER LAPAROSCOPE (MISCELLANEOUS) ×4 IMPLANT

## 2017-03-09 NOTE — Anesthesia Postprocedure Evaluation (Signed)
Anesthesia Post Note  Patient: Meredith Shannon  Procedure(s) Performed: Procedure(s) (LRB): HYSTERECTOMY TOTAL LAPAROSCOPIC (N/A) LAPAROSCOPIC SALPINGO OOPHORECTOMY (Bilateral) CYSTOSCOPY (N/A)     Patient location during evaluation: PACU Anesthesia Type: General Level of consciousness: awake, sedated and oriented Pain management: pain level controlled Vital Signs Assessment: post-procedure vital signs reviewed and stable Respiratory status: spontaneous breathing, nonlabored ventilation, respiratory function stable and patient connected to nasal cannula oxygen Cardiovascular status: blood pressure returned to baseline and stable Postop Assessment: no signs of nausea or vomiting Anesthetic complications: no    Last Vitals:  Vitals:   03/09/17 1330 03/09/17 1425  BP: 119/71 122/74  Pulse: 85 75  Resp: 15 16  Temp: 36.8 C 36.6 C    Last Pain:  Vitals:   03/09/17 1425  TempSrc: Axillary  PainSc:                  Coree Riester,JAMES TERRILL

## 2017-03-09 NOTE — Transfer of Care (Signed)
Immediate Anesthesia Transfer of Care Note  Patient: Meredith Shannon  Procedure(s) Performed: Procedure(s): HYSTERECTOMY TOTAL LAPAROSCOPIC (N/A) LAPAROSCOPIC SALPINGO OOPHORECTOMY (Bilateral) CYSTOSCOPY (N/A)  Patient Location: PACU  Anesthesia Type:General  Level of Consciousness: awake, alert , oriented and patient cooperative  Airway & Oxygen Therapy: Patient Spontanous Breathing and Patient connected to nasal cannula oxygen  Post-op Assessment: Report given to RN and Post -op Vital signs reviewed and stable  Post vital signs: Reviewed and stable  Last Vitals:  Vitals:   03/09/17 0610  BP: (!) 141/79  Pulse: 81  Resp: 16  Temp: 36.9 C    Last Pain:  Vitals:   03/09/17 0610  TempSrc: Oral      Patients Stated Pain Goal: 6 (75/88/32 5498)  Complications: No apparent anesthesia complications

## 2017-03-09 NOTE — Anesthesia Procedure Notes (Signed)
Procedure Name: Intubation Date/Time: 03/09/2017 7:32 AM Performed by: Wanita Chamberlain Pre-anesthesia Checklist: Patient identified, Timeout performed, Emergency Drugs available, Suction available and Patient being monitored Patient Re-evaluated:Patient Re-evaluated prior to induction Oxygen Delivery Method: Circle system utilized Preoxygenation: Pre-oxygenation with 100% oxygen Induction Type: IV induction Ventilation: Mask ventilation without difficulty Laryngoscope Size: Mac and 3 Grade View: Grade I Tube type: Oral Number of attempts: 1 Airway Equipment and Method: Stylet Placement Confirmation: ETT inserted through vocal cords under direct vision,  positive ETCO2 and breath sounds checked- equal and bilateral Secured at: 22 cm Tube secured with: Tape Dental Injury: Teeth and Oropharynx as per pre-operative assessment

## 2017-03-09 NOTE — Progress Notes (Signed)
Day of Surgery Procedure(s) (LRB): HYSTERECTOMY TOTAL LAPAROSCOPIC (N/A) LAPAROSCOPIC SALPINGO OOPHORECTOMY (Bilateral) CYSTOSCOPY (N/A)  Subjective: Patient reports no nausea and "pretty good" pain control.  Has only received Toradol.  Does not want anything else for pain.  Has voided x 2 and walked in the hall.  Ordered regular diet.    Objective: I have reviewed patient's vital signs, intake and output, medications and labs. Vitals:   03/09/17 1425 03/09/17 1537  BP: 122/74 115/63  Pulse: 75 78  Resp: 16 18  Temp: 97.9 F (36.6 C) 98.2 F (36.8 C)    General: alert and cooperative Resp: clear to auscultation bilaterally Cardio: regular rate and rhythm, S1, S2 normal, no murmur, click, rub or gallop GI: normal findings: bowel sounds normal and soft and appropriately tender and incision: clean, dry and intact Extremities: extremities normal, atraumatic, no cyanosis or edema and SCDs on and functioning Vaginal Bleeding: minimal  Assessment: s/p Procedure(s): HYSTERECTOMY TOTAL LAPAROSCOPIC (N/A) LAPAROSCOPIC SALPINGO OOPHORECTOMY (Bilateral) CYSTOSCOPY (N/A): stable and progressing well  Plan: Advance diet Encourage ambulation  Transition to po medications CBC and BMP in am  LOS: 0 days    [name] SUZANNE 03/09/2017, 5:54 PM

## 2017-03-09 NOTE — Op Note (Signed)
03/09/2017  10:32 AM  PATIENT:  Meredith Shannon  61 y.o. female  PRE-OPERATIVE DIAGNOSIS:  complex endometrial hyperplasia  POST-OPERATIVE DIAGNOSIS:  complex endometrial hyperplasia  PROCEDURE:  Procedure(s): HYSTERECTOMY TOTAL LAPAROSCOPIC LAPAROSCOPIC SALPINGO OOPHORECTOMY CYSTOSCOPY  SURGEON:  Harlynn Kimbell SUZANNE  ASSISTANTS: Josefa Half, MD   ANESTHESIA:   general  ESTIMATED BLOOD LOSS: 35cc  BLOOD ADMINISTERED:none   FLUIDS: 1300cc LR and antibiotics  UOP: 100cc clear but concentrated urine  SPECIMEN:  Uterus, cervix, bilateral tubes and ovaries, s/p tubal ligation with portion of tube removed  DISPOSITION OF SPECIMEN:  PATHOLOGY  FINDINGS: normal pelvis, normal upper abdomen  DESCRIPTION OF OPERATION: Patient is taken to the operating room. She is placed in the supine position. She is a running IV in place. Informed consent was present on the chart. SCDs on her lower extremities and functioning properly. General endotracheal anesthesia was administered by the anesthesia staff without difficulty.  Once adequate anesthesia was confirmed the legs are placed in the low lithotomy position in [place]. Her arms were tucked by the side.   Chlora prep was then used to prep the abdomen and Betadine was used to prep the inner thighs, perineum and vagina. Once 3 minutes had past the patient was draped in a normal standard fashion. The legs were lifted to the high lithotomy position. The cervix was visualized by placing a heavy weighted speculum in the posterior aspect of the vagina and using a curved Deaver retractor to the retract anteriorly. The anterior lip of the cervix was grasped with single-tooth tenaculum.  The cervix sounded to 6.5 cm. Pratt dilators were used to dilate the cervix up to a #21. A RUMI uterine manipulator was obtained. A #6 disposable tip was placed on the RUMI manipulator as well as a small (2.5) silver KOH ring. This was passed through the cervix and the  bulb of the disposable tip was inflated with 6cc of normal saline. There was a good fit of the KOH ring around the cervix. The tenaculum was removed. There is also good manipulation of the uterus. The speculum and retractor were removed as well. A Foley catheter was placed to straight drain. The concentrated urine was noted. Legs were lowered to the low lithotomy position and attention was turned the abdomen.  The umbilicus was everted.  A Veress needle was obtained. Syringe of sterile saline was placed on a open Veress needle.  This was passed into the umbilicus until just when the fluid started to drip.  Then low flow CO2 gas was attached the needle and the pneumoperitoneum was achieved without difficulty. Once 2 liters of gas was in the abdomen the Veress needle was removed and a 5 millimeter non-bladed Optiview trocar and port were passed directly to the abdomen. The laparoscope was then used to confirm intraperitoneal placement. A small uterus with atrophic ovaries was noted.  Locations for RLQ and LLQ ports were noted by transillumination of the abdominal wall.  0.25% marcaine was used to anesthetize the skin.  54mm skin incision was made in the RLQ and an Airseal port placed in this incision.  A 46mm skin incision was in the LLQ and a 51mm non-bladed port were placed.  Finally a midline incision was made about 4 cm above the pubic symphysis.  The skin was incised about 1cm and a non-bladed 50mm port was placed with direct visualization of the laparoscope.    Ureters were identifies.  Attention was turned to the left side. With uterus on  stretch the left IP ligament was serially clamped, cauterized and incised.  Then the Left round ligament was serially clamped cauterized and incised. The anterior and posterior peritoneum of the inferior leaf of the broad ligament were opened. The beginning of the baldder flap was created using the Harmonic scalpel and blunt dissection.  The bladder was taken down below the  level of the KOH ring. The left uterine artery skeletonized and then just superior to the KOH ring this vessel was serially clamped, cauterized, and incised.  Attention was turned the right side.  The uterus was placed on stretch to the opposite side.  The IP ligament was serially clamped, cauterized and incised.  Next the right round ligament was serially clamped cauterized and incised. The anterior posterior peritoneum of the inferiorly for the broad ligament were opened. The anterior peritoneum was carried across to the dissection on the left side. The remainder of the bladder flap was created using the Harmonic scalpel and blunt dissection.  The bladder was well below the level of the KOH ring. The left uterine artery skeletonized. Then the left uterine artery, above the level of the KOH ring, was serially clamped cauterized and incised. The uterus was devascularized at this point.  The colpotomy was performed a starting in the midline and using a harmonic scalpel with the inferior edge of the open blade  This was carried around a circumferential fashion until the vaginal mucosa was completely incised in the specimen was freed.  The specimen was then delivered to the vagina.  A vaginal occlusive device was used to maintain the pneumoperitoneum  Instruments were changed with a needle driver and Kobra graspers.  Using a 9 inch V. lock suture, the cuff was closed by incorporating the anterior and posterior vaginal mucosa in each stitch. This was carried across all the way to the left corner and a running fashion. Two stitches were brought back towards the midline and the suture was cut flush with the vagina. The needle was brought out the pelvis. Pneumoperitoneum was relieved without any bleeding being noted.  The pneumoperitoneum was achieved again.  The pelvis was irrigated. All pedicles were inspected. No bleeding was noted. Arista was placed along all pedicles.  At this point the procedure was completed.  The 63mm port and LLQ ports were removed with direct visualization.  Then the remaining instruments were removed.  The pneumoperitoneum was relieved.  The patient was taken out of Trendelenburg positioning.  Several deep breaths were given to the patient's trying to any gas the abdomen and finally the midline port and RLQ port were removed.  The skin was then closed with subcuticular stitches of 3-0 Vicryl. The skin was cleansed Dermabond was applied. Attention was then turned the vagina and the cuff was inspected. No bleeding was noted. The anterior posterior vaginal mucosa was incorporated in each stitch. The Foley catheter was removed.  Cystoscopy was performed.  No sutures or bladder injuries were noted.  Ureters were noted with normal urine jets from each one was seen.  Cystoscopic fluid was drained.   Foley was not replaced.  Sponge, lap, needle, initially counts were correct x2. Patient tolerated the procedure very well. She was awakened from anesthesia, extubated and taken to recovery in stable condition.    COUNTS:  YES  PLAN OF CARE: Transfer to PACU

## 2017-03-09 NOTE — H&P (Signed)
Meredith Shannon is an 61 y.o. female G2P1 MWF here for definitive treatment of complex endometrial hyperplasia noted in an endometrial polyp removed 12/26/16.  She has been counseled about alternatives, risks, and benefits and is here and ready to proceed.  She does want both ovaries and tubes removed if possible.  All questions answered this morning.  She has been cleared for surgery by cardiology.    Pertinent Gynecological History: Menses: post-menopausal Bleeding: none Contraception: post menopausal status DES exposure: denies Blood transfusions: none Sexually transmitted diseases: no past history Previous GYN Procedures: hysteroscopy 5/18  Last mammogram: normal Date: 11/17 Last pap: normal Date: 10/17 OB History: G2, P1   Menstrual History: Patient's last menstrual period was 08/04/2006.    Past Medical History:  Diagnosis Date  . Broken wrist 2008  . Cancer ([place])    basal cell  . Chest pain 2018   seen by Dr. Ellyn Hack.  Has mid LAD stenosis.  . Complication of anesthesia    in recover room once when waking up slow to exhale per patient   . GERD (gastroesophageal reflux disease)    occasional uses tums  . High cholesterol    In total cholesterol and triglycerides.  Marland Kitchen HSV-1 infection   . Lichen simplex chronicus     Past Surgical History:  Procedure Laterality Date  . BREAST BIOPSY  5/12   fibrocystic changes  . DILATATION & CURETTAGE/HYSTEROSCOPY WITH MYOSURE N/A 12/26/2016   Procedure: [place];  Surgeon: Megan Salon, MD;  Location: [facility];  Service: Gynecology;  Laterality: N/A;  . DILATION AND CURETTAGE OF UTERUS     "years ago"  . FACIAL COSMETIC SURGERY    . HAND SURGERY Right 2012   fractured bone in hands  . TUBAL LIGATION  1983  . WRIST FRACTURE SURGERY Right 2008    Family History  Problem Relation Age of Onset  . Osteoporosis Mother     Social History:  reports that she quit smoking about 32 years ago. Her  smoking use included Cigarettes. She smoked 0.50 packs per day. She has never used smokeless tobacco. She reports that she drinks about 0.6 - 1.2 oz of alcohol per week . She reports that she does not use drugs.  Allergies:  Allergies  Allergen Reactions  . Penicillins Swelling, Rash and Other (See Comments)    Tongue swelling Has patient had a PCN reaction causing immediate rash, facial/tongue/throat swelling, SOB or lightheadedness with hypotension:Yes Has patient had a PCN reaction causing severe rash involving mucus membranes or skin necrosis:No Has patient had a PCN reaction that required hospitalization:Treated & released in ER--No Has patient had a PCN reaction occurring within the last 10 years:No If all of the above answers are "NO", then may proceed with Cephalosporin use.     Prescriptions Prior to Admission  Medication Sig Dispense Refill Last Dose  . acyclovir (ZOVIRAX) 800 MG tablet Take 800 mg by mouth daily.    Past Week at Unknown time  . Calcium Carb-Cholecalciferol (CALCIUM + D3 PO) Take 1 tablet by mouth daily.   Past Week at Unknown time  . Cholecalciferol (VITAMIN D) 2000 UNITS CAPS Take 2,000 Units by mouth daily.    Past Week at Unknown time  . MAGNESIUM PO Take 1 tablet by mouth daily.    Past Week at Unknown time  . Misc. Devices (VAGINAL SUPPOSITORY APPLICATOR) MISC Place 160 Units vaginally 3 (three) times a week. vitamin E (VITAMIN E) 200 UNIT  capsule INSERT 1 VAGINALLY SUPPOSITORY 3 TIMES WEEKLY   Past Week at Unknown time  . NONFORMULARY OR COMPOUNDED ITEM Vitamin E 200U/ml vaginal suppositories.  1 pv two to three times weekly. 36 each 4 Past Month at Unknown time  . rosuvastatin (CRESTOR) 20 MG tablet Take 20 mg by mouth daily.   03/08/2017 at Unknown time  . vitamin C (ASCORBIC ACID) 500 MG tablet Take 500 mg by mouth daily.   Past Week at Unknown time  . vitamin E 1000 UNIT capsule Take 1,000 Units by mouth daily.   Past Month at Unknown time  . ibuprofen  (ADVIL,MOTRIN) 800 MG tablet Take 1 tablet (800 mg total) by mouth every 8 (eight) hours as needed. 30 tablet 0   . nitroGLYCERIN (NITROSTAT) 0.4 MG SL tablet Place 1 tablet (0.4 mg total) under the tongue every 5 (five) minutes as needed for chest pain. 25 tablet 3 Taking  . oxyCODONE-acetaminophen (PERCOCET/ROXICET) 5-325 MG tablet Take 1-2 tablets by mouth every 6 (six) hours as needed for severe pain. 30 tablet 0     Review of Systems  All other systems reviewed and are negative.   Blood pressure (!) 141/79, pulse 81, temperature 98.5 F (36.9 C), temperature source Oral, resp. rate 16, weight 207 lb (93.9 kg), last menstrual period 08/04/2006, SpO2 97 %. Physical Exam  Constitutional: She is oriented to person, place, and time. She appears well-nourished.  Cardiovascular: Normal rate and regular rhythm.   Respiratory: Effort normal and breath sounds normal.  Neurological: She is alert and oriented to person, place, and time.  Skin: Skin is warm and dry.  Psychiatric: She has a normal mood and affect.    No results found for this or any previous visit (from the past 24 hour(s)).  No results found.  Assessment/Plan: 61 yo G2P1 MWF with complex endometrial hyperplasia an a polyp 5/18 here for definitive treatment with TLH/BSO/cystoscopy.  Questions answered.  Pt ready to proceed.  Meredith Shannon 03/09/2017, 7:01 AM

## 2017-03-10 ENCOUNTER — Encounter (HOSPITAL_BASED_OUTPATIENT_CLINIC_OR_DEPARTMENT_OTHER): Payer: Self-pay | Admitting: Obstetrics & Gynecology

## 2017-03-10 DIAGNOSIS — N800 Endometriosis of uterus: Secondary | ICD-10-CM | POA: Diagnosis not present

## 2017-03-10 LAB — BASIC METABOLIC PANEL
Anion gap: 8 (ref 5–15)
BUN: 16 mg/dL (ref 6–20)
CALCIUM: 9 mg/dL (ref 8.9–10.3)
CO2: 26 mmol/L (ref 22–32)
CREATININE: 0.82 mg/dL (ref 0.44–1.00)
Chloride: 108 mmol/L (ref 101–111)
Glucose, Bld: 117 mg/dL — ABNORMAL HIGH (ref 65–99)
Potassium: 3.9 mmol/L (ref 3.5–5.1)
SODIUM: 142 mmol/L (ref 135–145)

## 2017-03-10 LAB — CBC
HCT: 38.3 % (ref 36.0–46.0)
Hemoglobin: 13.5 g/dL (ref 12.0–15.0)
MCH: 32.7 pg (ref 26.0–34.0)
MCHC: 35.2 g/dL (ref 30.0–36.0)
MCV: 92.7 fL (ref 78.0–100.0)
PLATELETS: 183 10*3/uL (ref 150–400)
RBC: 4.13 MIL/uL (ref 3.87–5.11)
RDW: 12.3 % (ref 11.5–15.5)
WBC: 10.2 10*3/uL (ref 4.0–10.5)

## 2017-03-10 NOTE — Discharge Instructions (Signed)
°Post Anesthesia Home Care Instructions ° °Activity: °Get plenty of rest for the remainder of the day. A responsible individual must stay with you for 24 hours following the procedure.  °For the next 24 hours, DO NOT: °-Drive a car °-Operate machinery °-Drink alcoholic beverages °-Take any medication unless instructed by your physician °-Make any legal decisions or sign important papers. ° °Meals: °Start with liquid foods such as gelatin or soup. Progress to regular foods as tolerated. Avoid greasy, spicy, heavy foods. If nausea and/or vomiting occur, drink only clear liquids until the nausea and/or vomiting subsides. Call your physician if vomiting continues. ° °Special Instructions/Symptoms: °Your throat may feel dry or sore from the anesthesia or the breathing tube placed in your throat during surgery. If this causes discomfort, gargle with warm salt water. The discomfort should disappear within 24 hours. ° °If you had a scopolamine patch placed behind your ear for the management of post- operative nausea and/or vomiting: ° °1. The medication in the patch is effective for 72 hours, after which it should be removed.  Wrap patch in a tissue and discard in the trash. Wash hands thoroughly with soap and water. °2. You may remove the patch earlier than 72 hours if you experience unpleasant side effects which may include dry mouth, dizziness or visual disturbances. °3. Avoid touching the patch. Wash your hands with soap and water after contact with the patch. °   °Post Op Hysterectomy Instructions °Please read the instructions below. Refer to these instructions for the next few weeks. These instructions provide you with general information on caring for yourself after surgery. Your caregiver may also give you specific instructions. While your treatment has been planned according to the most current medical practices available, unavoidable problems sometimes happen. If you have any problems or questions after you  leave, please call your caregiver. ° °HOME CARE INSTRUCTIONS °Healing will take time. You will have discomfort, tenderness, swelling and bruising at the operative site for a couple of weeks. This is normal and will get better as time goes on.  °· Only take over-the-counter or prescription medicines for pain, discomfort or fever as directed by your caregiver.  °· Do not take aspirin. It can cause bleeding.  °· Do not drive when taking pain medication.  °· Follow your caregiver’s advice regarding diet, exercise, lifting, driving and general activities.  °· Resume your usual diet as directed and allowed.  °· Get plenty of rest and sleep.  °· Do not douche, use tampons, or have sexual intercourse until your caregiver gives you permission. .  °· Take your temperature if you feel hot or flushed.  °· You may shower today when you get home.  No tub bath for one week.   °· Do not drink alcohol until you are not taking any narcotic pain medications.  °· Try to have someone home with you for a week or two to help with the household activities.   Be careful over the next two to three weeks with any activities at home that involve lifting, pushing, or pulling.  Listen to your body--if something feels uncomfortable to do, then don't do it. °· Make sure you and your family understands everything about your operation and recovery.  °· Walking up stairs is fine. °· Do not sign any legal documents until you feel normal again.  °· Keep all your follow-up appointments as recommended by your caregiver.  ° °PLEASE CALL THE OFFICE IF: °· There is swelling, redness or increasing pain   in the wound area.  °· Pus is coming from the wound.  °· You notice a bad smell from the wound or surgical dressing.  °· You have pain, redness and swelling from the intravenous site.  °· The wound is breaking open (the edges are not staying together).   °· You develop pain or bleeding when you urinate.  °· You develop abnormal vaginal discharge.  °· You have  any type of abnormal reaction or develop an allergy to your medication.  °· You need stronger pain medication for your pain  ° °SEEK IMMEDIATE MEDICAL CARE: °· You develop a temperature of 100.5 or higher.  °· You develop abdominal pain.  °· You develop chest pain.  °· You develop shortness of breath.  °· You pass out.  °· You develop pain, swelling or redness of your leg.  °· You develop heavy vaginal bleeding with or without blood clots.  ° °MEDICATIONS: °· Restart your regular medications BUT wait one week before restarting all vitamins and mineral supplements °· Use Motrin 800mg every 8 hours for the next several days.  This will help you use less Percocet.  Use the Percocet 5/325 1-2 tabs every 4-6 hours as needed for pain. °· You may use an over the counter stool softener like Colace or Dulcolax to help with starting a bowel movement.  Start the day after you go home.  Warm liquids, fluids, and ambulation help too.  If you have not had a bowel movement in four days, you need to call the office. ° °

## 2017-03-10 NOTE — Progress Notes (Signed)
Discharge and medication instructions reviewed with patient and spouse. Questions answered and both deny further questions. No prescriptions given, as per Dr. Sabra Heck, patient has them already. Spouse is driving patient home.  Donne Hazel, RN

## 2017-03-10 NOTE — Progress Notes (Signed)
1 Day Post-Op Procedure(s) (LRB): HYSTERECTOMY TOTAL LAPAROSCOPIC (N/A) LAPAROSCOPIC SALPINGO OOPHORECTOMY (Bilateral) CYSTOSCOPY (N/A)  Subjective: Patient reports good pain control.  No nausea.  She's passed flatus.  She's walked, eaten regular diet, IV has been hep locked, and she is having no trouble voiding.  Ready to go home.   Objective: I have reviewed patient's vital signs, intake and output, medications and labs. Vitals:   03/10/17 0219 03/10/17 0600  BP: 91/66 95/64  Pulse: 67 74  Resp: 16 16  Temp: 98.3 F (36.8 C) 98.1 F (36.7 C)     General: alert and cooperative Resp: clear to auscultation bilaterally Cardio: regular rate and rhythm, S1, S2 normal, no murmur, click, rub or gallop GI: soft, non-tender; bowel sounds normal; no masses,  no organomegaly and incision: clean, dry and intact Extremities: extremities normal, atraumatic, no cyanosis or edema Vaginal Bleeding: none  Assessment: s/p Procedure(s): HYSTERECTOMY TOTAL LAPAROSCOPIC (N/A) LAPAROSCOPIC SALPINGO OOPHORECTOMY (Bilateral) CYSTOSCOPY (N/A): stable and progressing well  Plan: Discharge home  LOS: 0 days    [name] SUZANNE 03/10/2017, 7:19 AM

## 2017-03-17 ENCOUNTER — Encounter: Payer: Self-pay | Admitting: Obstetrics & Gynecology

## 2017-03-17 ENCOUNTER — Ambulatory Visit (INDEPENDENT_AMBULATORY_CARE_PROVIDER_SITE_OTHER): Payer: BLUE CROSS/BLUE SHIELD | Admitting: Obstetrics & Gynecology

## 2017-03-17 VITALS — BP 108/76 | HR 90 | Resp 16 | Ht 70.5 in | Wt 206.0 lb

## 2017-03-17 DIAGNOSIS — Z9889 Other specified postprocedural states: Secondary | ICD-10-CM

## 2017-03-17 NOTE — Progress Notes (Signed)
Post Operative Visit  Procedure:HYSTERECTOMY TOTAL LAPAROSCOPIC, LAPAROSCOPIC SALPINGO OOPHORECTOMY, CYSTOSCOPY     Days Post-op: 8 days   Subjective: Doing well.  No pain.  Off pain medications.  Voiding normally.  Having normal bowel movements.  No vaginal bleeding.  No fevers.  Objective: BP 108/76 (BP Location: Right Arm, Patient Position: Sitting, Cuff Size: Large)   Pulse 90   Resp 16   Ht 5' 10.5" (1.791 m)   Wt 206 lb (93.4 kg)   LMP 08/04/2006   BMI 29.14 kg/m   EXAM General: alert and cooperative Resp: clear to auscultation bilaterally Cardio: regular rate and rhythm, S1, S2 normal, no murmur, click, rub or gallop GI: soft, non-tender; bowel sounds normal; no masses,  no organomegaly and incision: clean, dry and intact Extremities: extremities normal, atraumatic, no cyanosis or edema Vaginal Bleeding: none  GYN:  NAEFG, vaginal without lesions, cuff well approximated, no masses or fullness  Assessment: s/p TLH/BSO/cystoscopy due to complex endometrial hyperplasia in a polyp  Plan: Recheck 3 weeks

## 2017-04-07 ENCOUNTER — Ambulatory Visit (INDEPENDENT_AMBULATORY_CARE_PROVIDER_SITE_OTHER): Payer: BLUE CROSS/BLUE SHIELD | Admitting: Obstetrics & Gynecology

## 2017-04-07 ENCOUNTER — Encounter: Payer: Self-pay | Admitting: Obstetrics & Gynecology

## 2017-04-07 VITALS — BP 108/70 | HR 88 | Resp 16 | Ht 70.5 in | Wt 207.0 lb

## 2017-04-07 DIAGNOSIS — N952 Postmenopausal atrophic vaginitis: Secondary | ICD-10-CM | POA: Diagnosis not present

## 2017-04-07 MED ORDER — ESTRADIOL 10 MCG VA TABS: ORAL_TABLET | VAGINAL | 12 refills | Status: DC

## 2017-04-07 NOTE — Progress Notes (Signed)
Post Operative Visit  Procedure: HYSTERECTOMY TOTAL LAPAROSCOPIC, LAPAROSCOPIC SALPINGO OOPHORECTOMY, CYSTOSCOPY  Days Post-op: 29  Subjective: Doing well.  Energy better.  Planned to return to work tomorrow but her work's HR did not send paperwork to me.  She brought it with her today.  Did have a little spotting week 2-3 after surgery but this has not been present for over a week.  Taking no pain medication.  Normal urinary and bowel function.  Would like to discuss vaginal atrophy and what to consider for treatment.  Has used Estring without improvement.  Vaginal estrogen cream helped but she did not use this regularly as she felt it was messy.  Has used Vit E vaginal suppositories.  Used osphena as well.  Vagifem discussed as well as vaginal laser treatment including lack of FDA approval but improvement in other patients.  Also discussed with pt my office does not do this so would need referral or suggestions for treatment.  Information provided to pt for her to do research.  Objective: BP 108/70 (BP Location: Right Arm, Patient Position: Sitting, Cuff Size: Large)   Pulse 88   Resp 16   Ht 5' 10.5" (1.791 m)   Wt 207 lb (93.9 kg)   LMP 08/04/2006   BMI 29.28 kg/m   EXAM General: alert, cooperative and no distress Resp: clear to auscultation bilaterally Cardio: regular rate and rhythm, S1, S2 normal, no murmur, click, rub or gallop GI: soft, non-tender; bowel sounds normal; no masses,  no organomegaly and incision: clean, dry and intact Extremities: extremities normal, atraumatic, no cyanosis or edema Vaginal Bleeding: none  Gyn:  NAEFG, vaginal without lesions, cuff healing well, no fullness  Assessment: s/p TLH/BSO, cystoscopy due to complex endometrial hyperplasia in a polyp Significant vaginal atrophic changes  Plan: Restrictions for return to work discussed.  Paperwork completed. Rx for vagifem 40meq pv nightly x 14 nights, then twice weekly.  She will start this after  05/04/17.

## 2017-04-07 NOTE — Patient Instructions (Signed)
[place] touch.  Vaginal laser treatment.

## 2017-04-23 ENCOUNTER — Ambulatory Visit: Payer: Self-pay | Admitting: Obstetrics & Gynecology

## 2017-08-27 ENCOUNTER — Other Ambulatory Visit: Payer: Self-pay | Admitting: Obstetrics & Gynecology

## 2017-08-27 DIAGNOSIS — Z1231 Encounter for screening mammogram for malignant neoplasm of breast: Secondary | ICD-10-CM

## 2017-09-03 ENCOUNTER — Ambulatory Visit (HOSPITAL_BASED_OUTPATIENT_CLINIC_OR_DEPARTMENT_OTHER)
Admission: RE | Admit: 2017-09-03 | Discharge: 2017-09-03 | Disposition: A | Discharge status: Home / Self Care | Payer: BLUE CROSS/BLUE SHIELD | Source: Ambulatory Visit | Attending: Obstetrics & Gynecology | Admitting: Obstetrics & Gynecology

## 2017-09-03 DIAGNOSIS — Z1231 Encounter for screening mammogram for malignant neoplasm of breast: Secondary | ICD-10-CM | POA: Insufficient documentation

## 2017-09-24 ENCOUNTER — Other Ambulatory Visit: Payer: Self-pay

## 2017-09-24 ENCOUNTER — Encounter: Payer: Self-pay | Admitting: Obstetrics & Gynecology

## 2017-09-24 ENCOUNTER — Ambulatory Visit: Payer: BLUE CROSS/BLUE SHIELD | Admitting: Obstetrics & Gynecology

## 2017-09-24 VITALS — BP 110/70 | HR 76 | Resp 14 | Ht 69.75 in | Wt 214.0 lb

## 2017-09-24 DIAGNOSIS — E2839 Other primary ovarian failure: Secondary | ICD-10-CM | POA: Diagnosis not present

## 2017-09-24 DIAGNOSIS — Z01419 Encounter for gynecological examination (general) (routine) without abnormal findings: Secondary | ICD-10-CM

## 2017-09-24 NOTE — Patient Instructions (Addendum)
Try to do a bone density with your mammogram next year.  Consider doing the Shingrix vaccine.

## 2017-09-24 NOTE — Progress Notes (Signed)
62 y.o. [id number] MarriedCaucasianF here for annual exam.  Doing well.  Denies vaginal bleeding.  Has elevated cholesterol.  Most recent lab work showed elevated lipids.  On Crestor.   Other lab work was good.     No SA.  Still having painful intercourse.  Denies vaginal bleeding.    Patient's last menstrual period was 08/04/2006.          Sexually active: No.  The current method of family planning is status post hysterectomy.    Exercising: No.   Smoker:  no  Health Maintenance: Pap:  06/03/16 Neg. HR HPV:neg   04/14/14 neg   History of abnormal Pap:  no MMG:  09/03/17 BIRADS2:Benign  Colonoscopy:  2009 f/u 10 years  BMD:   2009 TDaP:  2014  Pneumonia vaccine(s):  No Shingrix:   No Hep C testing: 06/03/16 Neg  Screening Labs: PCP   reports that she quit smoking about 33 years ago. Her smoking use included cigarettes. She smoked 0.50 packs per day. she has never used smokeless tobacco. She reports that she drinks about 0.6 - 1.2 oz of alcohol per week. She reports that she does not use drugs.  Past Medical History:  Diagnosis Date  . Broken wrist 2008  . Cancer ([place])    basal cell  . Chest pain 2018   seen by Dr. Ellyn Hack.  Has mid LAD stenosis.  . Complication of anesthesia    in recover room once when waking up slow to exhale per patient   . GERD (gastroesophageal reflux disease)    occasional uses tums  . High cholesterol    In total cholesterol and triglycerides.  Marland Kitchen HSV-1 infection   . Lichen simplex chronicus     Past Surgical History:  Procedure Laterality Date  . BREAST BIOPSY  5/12   fibrocystic changes  . CYSTOSCOPY N/A 03/09/2017   Procedure: CYSTOSCOPY;  Surgeon: Megan Salon, MD;  Location: [facility];  Service: Gynecology;  Laterality: N/A;  . DILATATION & CURETTAGE/HYSTEROSCOPY WITH MYOSURE N/A 12/26/2016   Procedure: [place];  Surgeon: Megan Salon, MD;  Location: [facility];  Service: Gynecology;   Laterality: N/A;  . DILATION AND CURETTAGE OF UTERUS     "years ago"  . FACIAL COSMETIC SURGERY    . HAND SURGERY Right 2012   fractured bone in hands  . LAPAROSCOPIC HYSTERECTOMY N/A 03/09/2017   Procedure: HYSTERECTOMY TOTAL LAPAROSCOPIC;  Surgeon: Megan Salon, MD;  Location: [facility];  Service: Gynecology;  Laterality: N/A;  . LAPAROSCOPIC SALPINGO OOPHERECTOMY Bilateral 03/09/2017   Procedure: LAPAROSCOPIC SALPINGO OOPHORECTOMY;  Surgeon: Megan Salon, MD;  Location: [facility];  Service: Gynecology;  Laterality: Bilateral;  . TUBAL LIGATION  1983  . WRIST FRACTURE SURGERY Right 2008    Current Outpatient Medications  Medication Sig Dispense Refill  . acyclovir (ZOVIRAX) 800 MG tablet Take 800 mg by mouth daily.     . Calcium Carb-Cholecalciferol (CALCIUM + D3 PO) Take 1 tablet by mouth daily.    . Cholecalciferol (VITAMIN D) 2000 UNITS CAPS Take 2,000 Units by mouth daily.     . Estradiol (VAGIFEM) 10 MCG TABS vaginal tablet 1 pv nightly x 14 nights, then twice weekly 18 tablet 12  . MAGNESIUM PO Take 1 tablet by mouth daily.     . rosuvastatin (CRESTOR) 20 MG tablet Take 20 mg by mouth daily.    Marland Kitchen triamcinolone ointment (KENALOG) 0.1 % APPLY TO  EARS TWICE A DAY FOR FLARES UPS.  0  . vitamin C (ASCORBIC ACID) 500 MG tablet Take 500 mg by mouth daily.    . vitamin E 1000 UNIT capsule Take 1,000 Units by mouth daily.     No current facility-administered medications for this visit.     Family History  Problem Relation Age of Onset  . Osteoporosis Mother     ROS:  Pertinent items are noted in HPI.  Otherwise, a comprehensive ROS was negative.  Exam:   BP 110/70 (BP Location: Right Arm, Patient Position: Sitting, Cuff Size: Large)   Pulse 76   Resp 14   Ht 5' 9.75" (1.772 m)   Wt 214 lb (97.1 kg)   LMP 08/04/2006   BMI 30.93 kg/m     Height: 5' 9.75" (177.2 cm)  Ht Readings from Last 3 Encounters:  09/24/17 5' 9.75" (1.772 m)  04/07/17  5' 10.5" (1.791 m)  03/17/17 5' 10.5" (1.791 m)    General appearance: alert, cooperative and appears stated age Head: Normocephalic, without obvious abnormality, atraumatic Neck: no adenopathy, supple, symmetrical, trachea midline and thyroid normal to inspection and palpation Lungs: clear to auscultation bilaterally Breasts: normal appearance, no masses or tenderness Heart: regular rate and rhythm Abdomen: soft, non-tender; bowel sounds normal; no masses,  no organomegaly Extremities: extremities normal, atraumatic, no cyanosis or edema Skin: Skin color, texture, turgor normal. No rashes or lesions Lymph nodes: Cervical, supraclavicular, and axillary nodes normal. No abnormal inguinal nodes palpated Neurologic: Grossly normal   Pelvic: External genitalia:  no lesions              Urethra:  normal appearing urethra with no masses, tenderness or lesions              Bartholins and Skenes: normal                 Vagina: normal appearing vagina with normal color and discharge, no lesions              Cervix: absent              Pap taken: No. Bimanual Exam:  Uterus:  uterus absent              Adnexa: no mass, fullness, tenderness               Rectovaginal: Confirms               Anus:  normal sphincter tone, no lesions  Chaperone was present for exam.  A:  Well Woman with normal exam PMP, no HRT Vaginal atrophic changes H/O HSV 1 Elevated lipids, on crestor H/o lichen simplex chronicus (biopsy 2008) H/O complex endometrial hyperplasia, s/p TLH/BSO/cystoscopy  P:   Mammogram guidelines reviewed pap smear not indicated Will plan BMD with next MMG.  Order placed. No lab work needed Pt considering Shingrix vaccine Vagifem rx not needed at this time. return annually or prn

## 2018-05-18 DIAGNOSIS — R0989 Other specified symptoms and signs involving the circulatory and respiratory systems: Secondary | ICD-10-CM | POA: Insufficient documentation

## 2018-05-20 ENCOUNTER — Other Ambulatory Visit: Payer: Self-pay | Admitting: Obstetrics & Gynecology

## 2018-05-20 NOTE — Telephone Encounter (Signed)
Medication refill request: Yuvafem Last AEX:  09/24/2017 Next AEX: 12/10/2018 Last MMG (if hormonal medication request): 09/03/2017 BI-RADS CATEGORY  2: Benign Refill authorized: #[id number], 6 refills

## 2018-09-10 ENCOUNTER — Other Ambulatory Visit (HOSPITAL_BASED_OUTPATIENT_CLINIC_OR_DEPARTMENT_OTHER): Payer: Self-pay | Admitting: Obstetrics & Gynecology

## 2018-09-10 DIAGNOSIS — Z1231 Encounter for screening mammogram for malignant neoplasm of breast: Secondary | ICD-10-CM

## 2018-09-16 ENCOUNTER — Ambulatory Visit (HOSPITAL_BASED_OUTPATIENT_CLINIC_OR_DEPARTMENT_OTHER)
Admission: RE | Admit: 2018-09-16 | Discharge: 2018-09-16 | Disposition: A | Discharge status: Home / Self Care | Payer: BLUE CROSS/BLUE SHIELD | Source: Ambulatory Visit | Attending: Obstetrics & Gynecology | Admitting: Obstetrics & Gynecology

## 2018-09-16 DIAGNOSIS — E2839 Other primary ovarian failure: Secondary | ICD-10-CM | POA: Insufficient documentation

## 2018-09-17 ENCOUNTER — Telehealth: Payer: Self-pay | Admitting: *Deleted

## 2018-09-17 NOTE — Telephone Encounter (Signed)
LM for pt to call back.

## 2018-09-17 NOTE — Telephone Encounter (Signed)
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  There is a little more significant osteopenia in her forearm.  Spine was not measured as there is some degenerative changes from arthritis present.  She does not need treatment but I do want her to make sure she is getting enough calcium--1200mg  daily with supplement and dietary sources as well as [phone] Vit D.  She needs to repeat the BMD in 3 years.  I will help her with scheduling this in the future.  Thanks.

## 2018-09-20 NOTE — Telephone Encounter (Signed)
LM for pt to call back. Second attempt  

## 2018-09-20 NOTE — Telephone Encounter (Signed)
Pt notified.  Verbalized understanding.

## 2018-10-06 ENCOUNTER — Ambulatory Visit (HOSPITAL_BASED_OUTPATIENT_CLINIC_OR_DEPARTMENT_OTHER)
Admission: RE | Admit: 2018-10-06 | Discharge: 2018-10-06 | Disposition: A | Discharge status: Home / Self Care | Payer: BLUE CROSS/BLUE SHIELD | Source: Ambulatory Visit | Attending: Obstetrics & Gynecology | Admitting: Obstetrics & Gynecology

## 2018-10-06 DIAGNOSIS — Z1231 Encounter for screening mammogram for malignant neoplasm of breast: Secondary | ICD-10-CM | POA: Insufficient documentation

## 2018-10-07 ENCOUNTER — Ambulatory Visit (HOSPITAL_BASED_OUTPATIENT_CLINIC_OR_DEPARTMENT_OTHER): Payer: BLUE CROSS/BLUE SHIELD

## 2018-10-11 ENCOUNTER — Other Ambulatory Visit: Payer: Self-pay

## 2018-10-11 ENCOUNTER — Encounter: Payer: Self-pay | Admitting: Obstetrics & Gynecology

## 2018-10-11 ENCOUNTER — Ambulatory Visit (INDEPENDENT_AMBULATORY_CARE_PROVIDER_SITE_OTHER): Payer: BLUE CROSS/BLUE SHIELD | Admitting: Obstetrics & Gynecology

## 2018-10-11 VITALS — BP 118/80 | HR 84 | Resp 16 | Ht 69.75 in | Wt 220.8 lb

## 2018-10-11 DIAGNOSIS — Z01419 Encounter for gynecological examination (general) (routine) without abnormal findings: Secondary | ICD-10-CM | POA: Diagnosis not present

## 2018-10-11 MED ORDER — ESTRADIOL 10 MCG VA TABS: 1.0000 | ORAL_TABLET | VAGINAL | 4 refills | Status: DC

## 2018-10-11 NOTE — Patient Instructions (Signed)
Dennard Nip, MD

## 2018-10-11 NOTE — Progress Notes (Signed)
63 y.o. G58P1011 Married White or Caucasian female here for annual exam.  Having some issues with a sensation that there is something in her throat when she swallows.  Did see ENT at [facility] who she felt "blew her off".  Saw Dr. Benson Norway and had endoscopy and colonoscopy. Had six polyps.  Follow up 3 years.  This was done in February.  Reports endoscopy pictures are not where the sensation of having something in her throat when she swallows is located.  Wants to specifically ask Dr. Benson Norway about this.  Has not had thyroid ultrasound or other neck imaging.  She really does not feel this related to reflux.    Denies vaginal bleeding.  continues to have vaginal dryness that is some improved with Vagifem.  Patient's last menstrual period was 08/04/2006.          Sexually active: No.  The current method of family planning is status post hysterectomy.    Exercising: No.   Smoker:  no  Health Maintenance: Pap:  06/03/16 Neg. HR HPV:neg   04/14/14 Neg  History of abnormal Pap:  no MMG:  10/06/18 BIRADS1:neg  Colonoscopy:  09/2018 f/u 3 years. Dr. Benson Norway  BMD:   09/16/18 Osteopenia   TDaP:  2014 Pneumonia vaccine(s):  n/a  Shingrix:   No Hep C testing: 06/03/16 Neg  Screening Labs: PCP   reports that she quit smoking about 34 years ago. Her smoking use included cigarettes. She smoked 0.50 packs per day. She has never used smokeless tobacco. She reports current alcohol use of about 2.0 standard drinks of alcohol per week. She reports that she does not use drugs.  Past Medical History:  Diagnosis Date  . Broken wrist 2008  . Cancer ([place])    basal cell  . Chest pain 2018   seen by Dr. Ellyn Hack.  Has mid LAD stenosis.  . Complication of anesthesia    in recover room once when waking up slow to exhale per patient   . GERD (gastroesophageal reflux disease)    occasional uses tums  . High cholesterol    In total cholesterol and triglycerides.  Marland Kitchen HSV-1 infection   . Lichen simplex chronicus     Past Surgical  History:  Procedure Laterality Date  . BREAST BIOPSY  5/12   fibrocystic changes  . CYSTOSCOPY N/A 03/09/2017   Procedure: CYSTOSCOPY;  Surgeon: Megan Salon, MD;  Location: [facility];  Service: Gynecology;  Laterality: N/A;  . DILATATION & CURETTAGE/HYSTEROSCOPY WITH MYOSURE N/A 12/26/2016   Procedure: [place];  Surgeon: Megan Salon, MD;  Location: [facility];  Service: Gynecology;  Laterality: N/A;  . DILATION AND CURETTAGE OF UTERUS     "years ago"  . FACIAL COSMETIC SURGERY    . HAND SURGERY Right 2012   fractured bone in hands  . LAPAROSCOPIC HYSTERECTOMY N/A 03/09/2017   Procedure: HYSTERECTOMY TOTAL LAPAROSCOPIC;  Surgeon: Megan Salon, MD;  Location: [facility];  Service: Gynecology;  Laterality: N/A;  . LAPAROSCOPIC SALPINGO OOPHERECTOMY Bilateral 03/09/2017   Procedure: LAPAROSCOPIC SALPINGO OOPHORECTOMY;  Surgeon: Megan Salon, MD;  Location: [facility];  Service: Gynecology;  Laterality: Bilateral;  . TUBAL LIGATION  1983  . WRIST FRACTURE SURGERY Right 2008    Current Outpatient Medications  Medication Sig Dispense Refill  . acyclovir (ZOVIRAX) 800 MG tablet Take 800 mg by mouth daily.     . Calcium Carb-Cholecalciferol (CALCIUM + D3 PO) Take 1  tablet by mouth daily.    . Cholecalciferol (VITAMIN D) 2000 UNITS CAPS Take 2,000 Units by mouth daily.     . Estradiol (YUVAFEM) 10 MCG TABS vaginal tablet Place 1 tablet (10 mcg total) vaginally 2 (two) times a week. 24 tablet 1  . MAGNESIUM PO Take 1 tablet by mouth daily.     . rosuvastatin (CRESTOR) 20 MG tablet Take 20 mg by mouth daily.    Marland Kitchen triamcinolone ointment (KENALOG) 0.1 % APPLY TO EARS TWICE A DAY FOR FLARES UPS.  0  . vitamin C (ASCORBIC ACID) 500 MG tablet Take 500 mg by mouth daily.    . vitamin E 1000 UNIT capsule Take 1,000 Units by mouth daily.     No current facility-administered medications for this visit.     Family  History  Problem Relation Age of Onset  . Osteoporosis Mother     Review of Systems  All other systems reviewed and are negative.   Exam:   BP 118/80 (BP Location: Right Arm, Patient Position: Sitting, Cuff Size: Large)   Pulse 84   Resp 16   Ht 5' 9.75" (1.772 m)   Wt 220 lb 12.8 oz (100.2 kg)   LMP 08/04/2006   BMI 31.91 kg/m  Weight: +6#  Height: 5' 9.75" (177.2 cm)  Ht Readings from Last 3 Encounters:  10/11/18 5' 9.75" (1.772 m)  09/24/17 5' 9.75" (1.772 m)  04/07/17 5' 10.5" (1.791 m)    General appearance: alert, cooperative and appears stated age Head: Normocephalic, without obvious abnormality, atraumatic Neck: no adenopathy, supple, symmetrical, trachea midline and thyroid normal to inspection and palpation Lungs: clear to auscultation bilaterally Breasts: normal appearance, no masses or tenderness Heart: regular rate and rhythm Abdomen: soft, non-tender; bowel sounds normal; no masses,  no organomegaly Extremities: extremities normal, atraumatic, no cyanosis or edema Skin: Skin color, texture, turgor normal. No rashes or lesions Lymph nodes: Cervical, supraclavicular, and axillary nodes normal. No abnormal inguinal nodes palpated Neurologic: Grossly normal   Pelvic: External genitalia:  no lesions              Urethra:  normal appearing urethra with no masses, tenderness or lesions              Bartholins and Skenes: normal                 Vagina: normal appearing vagina with normal color and discharge, no lesions              Cervix: absent              Pap taken: No. Bimanual Exam:  Uterus:  uterus absent              Adnexa: no mass, fullness, tenderness               Rectovaginal: Confirms               Anus:  normal sphincter tone, no lesions  Chaperone was present for exam.  A:  Well Woman with normal exam PMP,no HRT Vaginal atrophic changes Elevated lipids H/o lichen simplex chronicus with biopsy 2008 (no symptoms or skin changes at this  time) H/O TLH/BSO/cystoscopy due to complex endometrial hyperplasia Sensation of something being in her throat when she swallows  P:   Mammogram guidelines reviewed.  Doing yearly. pap smear not indicated RF for vagifem 86meq pv twice weekly.  Rx to pharmacy Lab work UTD with Emmie Niemann Shingrix vaccination reviewed  D/w pt thyroid ultrasound.  She may decide to do this and will call if desires scheduling.  She will decide after having follow up with Dr. Benson Norway return annually or prn

## 2018-10-13 ENCOUNTER — Encounter: Payer: Self-pay | Admitting: Obstetrics & Gynecology

## 2018-10-20 DIAGNOSIS — R1310 Dysphagia, unspecified: Secondary | ICD-10-CM | POA: Diagnosis not present

## 2018-10-20 DIAGNOSIS — K219 Gastro-esophageal reflux disease without esophagitis: Secondary | ICD-10-CM | POA: Diagnosis not present

## 2018-11-09 ENCOUNTER — Other Ambulatory Visit: Payer: Self-pay | Admitting: Obstetrics & Gynecology

## 2018-11-09 NOTE — Telephone Encounter (Signed)
Medication refill request: yuvafem 71mcg Last AEX:  10-11-2018 Next AEX: 03-02-2020 Last MMG (if hormonal medication request): 10-08-2018 category c density birads 1:neg Refill authorized: refill request came from [facility]. Patient states she is not using this pharmacy & to deny rx.

## 2018-12-10 ENCOUNTER — Ambulatory Visit: Payer: BLUE CROSS/BLUE SHIELD | Admitting: Obstetrics & Gynecology

## 2019-03-29 DIAGNOSIS — L820 Inflamed seborrheic keratosis: Secondary | ICD-10-CM | POA: Diagnosis not present

## 2019-06-17 DIAGNOSIS — B078 Other viral warts: Secondary | ICD-10-CM | POA: Diagnosis not present

## 2019-06-17 DIAGNOSIS — D485 Neoplasm of uncertain behavior of skin: Secondary | ICD-10-CM | POA: Diagnosis not present

## 2019-08-18 DIAGNOSIS — U071 COVID-19: Secondary | ICD-10-CM | POA: Diagnosis not present

## 2019-09-08 ENCOUNTER — Other Ambulatory Visit (HOSPITAL_BASED_OUTPATIENT_CLINIC_OR_DEPARTMENT_OTHER): Payer: Self-pay | Admitting: Obstetrics & Gynecology

## 2019-09-08 DIAGNOSIS — Z1231 Encounter for screening mammogram for malignant neoplasm of breast: Secondary | ICD-10-CM

## 2019-10-12 ENCOUNTER — Ambulatory Visit (HOSPITAL_BASED_OUTPATIENT_CLINIC_OR_DEPARTMENT_OTHER): Payer: BLUE CROSS/BLUE SHIELD

## 2019-10-18 ENCOUNTER — Other Ambulatory Visit: Payer: Self-pay

## 2019-10-18 ENCOUNTER — Encounter (HOSPITAL_BASED_OUTPATIENT_CLINIC_OR_DEPARTMENT_OTHER): Payer: Self-pay

## 2019-10-18 ENCOUNTER — Ambulatory Visit (HOSPITAL_BASED_OUTPATIENT_CLINIC_OR_DEPARTMENT_OTHER)
Admission: RE | Admit: 2019-10-18 | Discharge: 2019-10-18 | Disposition: A | Discharge status: Home / Self Care | Payer: BC Managed Care – PPO | Source: Ambulatory Visit | Attending: Obstetrics & Gynecology | Admitting: Obstetrics & Gynecology

## 2019-10-18 DIAGNOSIS — Z1231 Encounter for screening mammogram for malignant neoplasm of breast: Secondary | ICD-10-CM | POA: Insufficient documentation

## 2019-10-19 DIAGNOSIS — D2261 Melanocytic nevi of right upper limb, including shoulder: Secondary | ICD-10-CM | POA: Diagnosis not present

## 2019-10-19 DIAGNOSIS — D2262 Melanocytic nevi of left upper limb, including shoulder: Secondary | ICD-10-CM | POA: Diagnosis not present

## 2019-10-19 DIAGNOSIS — L218 Other seborrheic dermatitis: Secondary | ICD-10-CM | POA: Diagnosis not present

## 2019-10-19 DIAGNOSIS — D225 Melanocytic nevi of trunk: Secondary | ICD-10-CM | POA: Diagnosis not present

## 2020-03-01 NOTE — Progress Notes (Signed)
64 y.o. G40P1011 Married White or Caucasian female here for annual exam.  Mother is hospitalized at [facility].  She is 83.    She had Covid back in January.  She did get vaccinated in April.    Still having sensation of something pressing on esophagus when she swallows.  Has seen Dr. Benson Norway and had colonoscopy/endoscopy.    Denies vaginal bleeding.    Patient's last menstrual period was 08/04/2006.          Sexually active: No.  The current method of family planning is status post hysterectomy.    Exercising: No.  exercise Smoker:  no  Health Maintenance: Pap:  06-03-16 neg HPV HR neg History of abnormal Pap:  no MMG:  10-19-2019 category c density birads 1:neg Colonoscopy:  09/2018 f/u 85yrs Dr Benson Norway. BMD:   09-16-2018 osteopenia TDaP:  2014 Pneumonia vaccine(s):  Not done Shingrix:   Not done Hep C testing: neg 2017 Screening Labs: will be obtained today   reports that she quit smoking about 35 years ago. Her smoking use included cigarettes. She smoked 0.50 packs per day. She has never used smokeless tobacco. She reports current alcohol use. She reports that she does not use drugs.  Past Medical History:  Diagnosis Date  . Broken wrist 2008  . Cancer ([place])    basal cell  . Chest pain 2018   seen by Dr. Ellyn Hack.  Has mid LAD stenosis.  . Complication of anesthesia    in recover room once when waking up slow to exhale per patient   . GERD (gastroesophageal reflux disease)    occasional uses tums  . High cholesterol    In total cholesterol and triglycerides.  Marland Kitchen HSV-1 infection   . Lichen simplex chronicus     Past Surgical History:  Procedure Laterality Date  . BREAST BIOPSY  5/12   fibrocystic changes  . CYSTOSCOPY N/A 03/09/2017   Procedure: CYSTOSCOPY;  Surgeon: Megan Salon, MD;  Location: [facility];  Service: Gynecology;  Laterality: N/A;  . DILATATION & CURETTAGE/HYSTEROSCOPY WITH MYOSURE N/A 12/26/2016   Procedure: [place];  Surgeon: Megan Salon, MD;  Location: [facility];  Service: Gynecology;  Laterality: N/A;  . DILATION AND CURETTAGE OF UTERUS     "years ago"  . FACIAL COSMETIC SURGERY    . HAND SURGERY Right 2012   fractured bone in hands  . LAPAROSCOPIC HYSTERECTOMY N/A 03/09/2017   Procedure: HYSTERECTOMY TOTAL LAPAROSCOPIC;  Surgeon: Megan Salon, MD;  Location: [facility];  Service: Gynecology;  Laterality: N/A;  . LAPAROSCOPIC SALPINGO OOPHERECTOMY Bilateral 03/09/2017   Procedure: LAPAROSCOPIC SALPINGO OOPHORECTOMY;  Surgeon: Megan Salon, MD;  Location: [facility];  Service: Gynecology;  Laterality: Bilateral;  . TUBAL LIGATION  1983  . WRIST FRACTURE SURGERY Right 2008    Current Outpatient Medications  Medication Sig Dispense Refill  . acyclovir (ZOVIRAX) 800 MG tablet Take 800 mg by mouth daily.     . Calcium Carb-Cholecalciferol (CALCIUM + D3 PO) Take 1 tablet by mouth daily.    . Cholecalciferol (VITAMIN D) 2000 UNITS CAPS Take 2,000 Units by mouth daily.     . methocarbamol (ROBAXIN) 750 MG tablet One pill three times per day    . pantoprazole (PROTONIX) 40 MG tablet Take 1 tablet by mouth daily.    . rosuvastatin (CRESTOR) 20 MG tablet Take 20 mg by mouth daily.    Marland Kitchen triamcinolone ointment (KENALOG) 0.1 %  APPLY TO EARS TWICE A DAY FOR FLARES UPS.  0  . vitamin C (ASCORBIC ACID) 500 MG tablet Take 500 mg by mouth daily.    . vitamin E 1000 UNIT capsule Take 1,000 Units by mouth daily.     No current facility-administered medications for this visit.    Family History  Problem Relation Age of Onset  . Osteoporosis Mother     Review of Systems  Constitutional: Negative.   HENT: Negative.   Eyes: Negative.   Respiratory: Negative.   Cardiovascular: Negative.   Gastrointestinal: Negative.   Endocrine: Negative.   Genitourinary: Negative.   Musculoskeletal: Negative.   Skin: Negative.   Allergic/Immunologic: Negative.   Neurological:  Negative.   Hematological: Negative.   Psychiatric/Behavioral: Negative.     Exam:   BP 122/80   Pulse 70   Resp 16   Ht 5' 9.75" (1.772 m)   Wt (!) 215 lb (97.5 kg)   LMP 08/04/2006   BMI 31.07 kg/m   Height: 5' 9.75" (177.2 cm)  General appearance: alert, cooperative and appears stated age Head: Normocephalic, without obvious abnormality, atraumatic Neck: no adenopathy, supple, symmetrical, trachea midline and thyroid normal to inspection and palpation Lungs: clear to auscultation bilaterally Breasts: normal appearance, no masses or tenderness Heart: regular rate and rhythm Abdomen: soft, non-tender; bowel sounds normal; no masses,  no organomegaly Extremities: extremities normal, atraumatic, no cyanosis or edema Skin: Skin color, texture, turgor normal. No rashes or lesions Lymph nodes: Cervical, supraclavicular, and axillary nodes normal. No abnormal inguinal nodes palpated Neurologic: Grossly normal   Pelvic: External genitalia:  no lesions              Urethra:  normal appearing urethra with no masses, tenderness or lesions              Bartholins and Skenes: normal                 Vagina: normal appearing vagina with normal color and discharge, no lesions              Cervix: absent              Pap taken: No. Bimanual Exam:  Uterus:  uterus absent              Adnexa: normal adnexa               Rectovaginal: Confirms               Anus:  normal sphincter tone, no lesions  Chaperone, Royal Hawthorn, CMA, was present for exam.  A:  Well Woman with normal exam PMP, no HRT Vaginal atrophic changes H/o elevated lipids H/o lichen simplex chronicus with biopsy w/ 2008, no new symptoms H/o TLH/BSO/Cysotsocpy due to complex endometrial hyperplasia Dysphagia  P:   Mammogram is up to date pap smear not indicated CBC, CMP, lipids, TSH and Vit D obtained today Thyroid ultrasound will be scheduled Colonoscopy due 2023.  She will want to see different GI when this is  due Emmie Niemann, PA, does all of pt's prescriptions. return annually or prn

## 2020-03-02 ENCOUNTER — Encounter: Payer: Self-pay | Admitting: Obstetrics & Gynecology

## 2020-03-02 ENCOUNTER — Other Ambulatory Visit: Payer: Self-pay

## 2020-03-02 ENCOUNTER — Telehealth: Payer: Self-pay | Admitting: *Deleted

## 2020-03-02 ENCOUNTER — Ambulatory Visit: Payer: BC Managed Care – PPO | Admitting: Obstetrics & Gynecology

## 2020-03-02 VITALS — BP 122/80 | HR 70 | Resp 16 | Ht 69.75 in | Wt 215.0 lb

## 2020-03-02 DIAGNOSIS — Z01419 Encounter for gynecological examination (general) (routine) without abnormal findings: Secondary | ICD-10-CM | POA: Diagnosis not present

## 2020-03-02 DIAGNOSIS — Z0000 Encounter for general adult medical examination without abnormal findings: Secondary | ICD-10-CM | POA: Diagnosis not present

## 2020-03-02 DIAGNOSIS — E559 Vitamin D deficiency, unspecified: Secondary | ICD-10-CM | POA: Diagnosis not present

## 2020-03-02 DIAGNOSIS — R1310 Dysphagia, unspecified: Secondary | ICD-10-CM

## 2020-03-02 NOTE — Telephone Encounter (Signed)
Spoke with Meredith Shannon at [facility]. Thyroid US scheduled for 03/09/20 at 11:15am, arrive at [place], [address] Location.   Placed in [place] hold.   Patient notified of appt while in office.   Routing to provider for final review. Patient is agreeable to disposition. Will close encounter.

## 2020-03-02 NOTE — Telephone Encounter (Signed)
-----   Message from Megan Salon, MD sent at 03/02/2020 10:04 AM EDT ----- Regarding: thyroid ultrasound Sharee Pimple, Could you schedule a thyroid ultrasound for this pt. She has dysphagia and has already had endoscopy evaluation with Dr. Benson Norway.  Anytime is fine.  Thanks.  Vinnie Level

## 2020-03-03 LAB — CBC WITH DIFFERENTIAL/PLATELET
Basophils Absolute: 0.1 10*3/uL (ref 0.0–0.2)
Basos: 1 %
EOS (ABSOLUTE): 0.1 10*3/uL (ref 0.0–0.4)
Eos: 2 %
Hematocrit: 47.2 % — ABNORMAL HIGH (ref 34.0–46.6)
Hemoglobin: 15.8 g/dL (ref 11.1–15.9)
Immature Grans (Abs): 0 10*3/uL (ref 0.0–0.1)
Immature Granulocytes: 0 %
Lymphocytes Absolute: 1.8 10*3/uL (ref 0.7–3.1)
Lymphs: 29 %
MCH: 32 pg (ref 26.6–33.0)
MCHC: 33.5 g/dL (ref 31.5–35.7)
MCV: 96 fL (ref 79–97)
Monocytes Absolute: 0.5 10*3/uL (ref 0.1–0.9)
Monocytes: 8 %
Neutrophils Absolute: 3.9 10*3/uL (ref 1.4–7.0)
Neutrophils: 60 %
Platelets: 226 10*3/uL (ref 150–450)
RBC: 4.93 x10E6/uL (ref 3.77–5.28)
RDW: 12.5 % (ref 11.7–15.4)
WBC: 6.4 10*3/uL (ref 3.4–10.8)

## 2020-03-03 LAB — COMPREHENSIVE METABOLIC PANEL
ALT: 17 IU/L (ref 0–32)
AST: 17 IU/L (ref 0–40)
Albumin/Globulin Ratio: 2 (ref 1.2–2.2)
Albumin: 4.6 g/dL (ref 3.8–4.8)
Alkaline Phosphatase: 74 IU/L (ref 48–121)
BUN/Creatinine Ratio: 16 (ref 12–28)
BUN: 14 mg/dL (ref 8–27)
Bilirubin Total: 0.6 mg/dL (ref 0.0–1.2)
CO2: 25 mmol/L (ref 20–29)
Calcium: 9.7 mg/dL (ref 8.7–10.3)
Chloride: 101 mmol/L (ref 96–106)
Creatinine, Ser: 0.9 mg/dL (ref 0.57–1.00)
GFR calc Af Amer: 79 mL/min/{1.73_m2} (ref >59)
GFR calc non Af Amer: 69 mL/min/{1.73_m2} (ref >59)
Globulin, Total: 2.3 g/dL (ref 1.5–4.5)
Glucose: 92 mg/dL (ref 65–99)
Potassium: 4.5 mmol/L (ref 3.5–5.2)
Sodium: 140 mmol/L (ref 134–144)
Total Protein: 6.9 g/dL (ref 6.0–8.5)

## 2020-03-03 LAB — LIPID PANEL
Chol/HDL Ratio: 3.5 ratio (ref 0.0–4.4)
Cholesterol, Total: 224 mg/dL — ABNORMAL HIGH (ref 100–199)
HDL: 64 mg/dL (ref >39)
LDL Chol Calc (NIH): 130 mg/dL — ABNORMAL HIGH (ref 0–99)
Triglycerides: 169 mg/dL — ABNORMAL HIGH (ref 0–149)
VLDL Cholesterol Cal: 30 mg/dL (ref 5–40)

## 2020-03-03 LAB — TSH: TSH: 0.763 u[IU]/mL (ref 0.450–4.500)

## 2020-03-03 LAB — VITAMIN D 25 HYDROXY (VIT D DEFICIENCY, FRACTURES): Vit D, 25-Hydroxy: 42.6 ng/mL (ref 30.0–100.0)

## 2020-03-05 ENCOUNTER — Encounter: Payer: Self-pay | Admitting: Obstetrics & Gynecology

## 2020-03-09 ENCOUNTER — Ambulatory Visit
Admission: RE | Admit: 2020-03-09 | Discharge: 2020-03-09 | Disposition: A | Discharge status: Home / Self Care | Payer: BC Managed Care – PPO | Source: Ambulatory Visit | Attending: Obstetrics & Gynecology | Admitting: Obstetrics & Gynecology

## 2020-03-09 DIAGNOSIS — E042 Nontoxic multinodular goiter: Secondary | ICD-10-CM | POA: Diagnosis not present

## 2020-03-09 DIAGNOSIS — R1310 Dysphagia, unspecified: Secondary | ICD-10-CM | POA: Diagnosis not present

## 2020-08-15 ENCOUNTER — Ambulatory Visit (INDEPENDENT_AMBULATORY_CARE_PROVIDER_SITE_OTHER): Payer: BC Managed Care – PPO | Admitting: Bariatrics

## 2020-08-15 ENCOUNTER — Other Ambulatory Visit: Payer: Self-pay

## 2020-08-15 ENCOUNTER — Encounter (INDEPENDENT_AMBULATORY_CARE_PROVIDER_SITE_OTHER): Payer: Self-pay | Admitting: Bariatrics

## 2020-08-15 VITALS — BP 145/84 | HR 82 | Temp 98.0°F | Ht 70.0 in | Wt 215.0 lb

## 2020-08-15 DIAGNOSIS — Z1331 Encounter for screening for depression: Secondary | ICD-10-CM

## 2020-08-15 DIAGNOSIS — Z9189 Other specified personal risk factors, not elsewhere classified: Secondary | ICD-10-CM

## 2020-08-15 DIAGNOSIS — Z6830 Body mass index (BMI) 30.0-30.9, adult: Secondary | ICD-10-CM

## 2020-08-15 DIAGNOSIS — K219 Gastro-esophageal reflux disease without esophagitis: Secondary | ICD-10-CM

## 2020-08-15 DIAGNOSIS — R5383 Other fatigue: Secondary | ICD-10-CM

## 2020-08-15 DIAGNOSIS — E538 Deficiency of other specified B group vitamins: Secondary | ICD-10-CM

## 2020-08-15 DIAGNOSIS — R7309 Other abnormal glucose: Secondary | ICD-10-CM | POA: Diagnosis not present

## 2020-08-15 DIAGNOSIS — E7849 Other hyperlipidemia: Secondary | ICD-10-CM | POA: Diagnosis not present

## 2020-08-15 DIAGNOSIS — E559 Vitamin D deficiency, unspecified: Secondary | ICD-10-CM | POA: Diagnosis not present

## 2020-08-15 DIAGNOSIS — E669 Obesity, unspecified: Secondary | ICD-10-CM

## 2020-08-15 DIAGNOSIS — Z0289 Encounter for other administrative examinations: Secondary | ICD-10-CM

## 2020-08-15 DIAGNOSIS — E6609 Other obesity due to excess calories: Secondary | ICD-10-CM

## 2020-08-15 DIAGNOSIS — R0602 Shortness of breath: Secondary | ICD-10-CM

## 2020-08-15 DIAGNOSIS — G4739 Other sleep apnea: Secondary | ICD-10-CM

## 2020-08-16 LAB — CBC WITH DIFFERENTIAL/PLATELET
Basophils Absolute: 0 10*3/uL (ref 0.0–0.2)
Basos: 1 %
EOS (ABSOLUTE): 0.1 10*3/uL (ref 0.0–0.4)
Eos: 2 %
Hematocrit: 48 % — ABNORMAL HIGH (ref 34.0–46.6)
Hemoglobin: 16.3 g/dL — ABNORMAL HIGH (ref 11.1–15.9)
Immature Grans (Abs): 0 10*3/uL (ref 0.0–0.1)
Immature Granulocytes: 0 %
Lymphocytes Absolute: 1.9 10*3/uL (ref 0.7–3.1)
Lymphs: 30 %
MCH: 31.8 pg (ref 26.6–33.0)
MCHC: 34 g/dL (ref 31.5–35.7)
MCV: 94 fL (ref 79–97)
Monocytes Absolute: 0.5 10*3/uL (ref 0.1–0.9)
Monocytes: 7 %
Neutrophils Absolute: 3.8 10*3/uL (ref 1.4–7.0)
Neutrophils: 60 %
Platelets: 188 10*3/uL (ref 150–450)
RBC: 5.12 x10E6/uL (ref 3.77–5.28)
RDW: 12.6 % (ref 11.7–15.4)
WBC: 6.3 10*3/uL (ref 3.4–10.8)

## 2020-08-16 LAB — COMPREHENSIVE METABOLIC PANEL
ALT: 19 IU/L (ref 0–32)
AST: 20 IU/L (ref 0–40)
Albumin/Globulin Ratio: 2 (ref 1.2–2.2)
Albumin: 4.8 g/dL (ref 3.8–4.8)
Alkaline Phosphatase: 76 IU/L (ref 44–121)
BUN/Creatinine Ratio: 22 (ref 12–28)
BUN: 20 mg/dL (ref 8–27)
Bilirubin Total: 0.3 mg/dL (ref 0.0–1.2)
CO2: 22 mmol/L (ref 20–29)
Calcium: 9.9 mg/dL (ref 8.7–10.3)
Chloride: 102 mmol/L (ref 96–106)
Creatinine, Ser: 0.93 mg/dL (ref 0.57–1.00)
GFR calc Af Amer: 76 mL/min/{1.73_m2} (ref >59)
GFR calc non Af Amer: 66 mL/min/{1.73_m2} (ref >59)
Globulin, Total: 2.4 g/dL (ref 1.5–4.5)
Glucose: 95 mg/dL (ref 65–99)
Potassium: 4.7 mmol/L (ref 3.5–5.2)
Sodium: 142 mmol/L (ref 134–144)
Total Protein: 7.2 g/dL (ref 6.0–8.5)

## 2020-08-16 LAB — LIPID PANEL WITH LDL/HDL RATIO
Cholesterol, Total: 255 mg/dL — ABNORMAL HIGH (ref 100–199)
HDL: 68 mg/dL (ref >39)
LDL Chol Calc (NIH): 166 mg/dL — ABNORMAL HIGH (ref 0–99)
LDL/HDL Ratio: 2.4 ratio (ref 0.0–3.2)
Triglycerides: 118 mg/dL (ref 0–149)
VLDL Cholesterol Cal: 21 mg/dL (ref 5–40)

## 2020-08-16 LAB — INSULIN, RANDOM: INSULIN: 8.2 u[IU]/mL (ref 2.6–24.9)

## 2020-08-16 LAB — VITAMIN D 25 HYDROXY (VIT D DEFICIENCY, FRACTURES): Vit D, 25-Hydroxy: 48.5 ng/mL (ref 30.0–100.0)

## 2020-08-16 LAB — T3: T3, Total: 110 ng/dL (ref 71–180)

## 2020-08-16 LAB — T4, FREE: Free T4: 1.39 ng/dL (ref 0.82–1.77)

## 2020-08-16 LAB — TSH: TSH: 0.788 u[IU]/mL (ref 0.450–4.500)

## 2020-08-16 LAB — HEMOGLOBIN A1C
Est. average glucose Bld gHb Est-mCnc: 114 mg/dL
Hgb A1c MFr Bld: 5.6 % (ref 4.8–5.6)

## 2020-08-16 LAB — VITAMIN B12: Vitamin B-12: 239 pg/mL (ref 232–1245)

## 2020-08-20 ENCOUNTER — Ambulatory Visit: Payer: BC Managed Care – PPO | Admitting: Podiatry

## 2020-08-20 NOTE — Progress Notes (Signed)
Chief Complaint:   OBESITY Meredith Shannon (MR# [id number]) is a 65 y.o. female who presents for evaluation and treatment of obesity and related comorbidities. Current BMI is Body mass index is 30.85 kg/m. Meredith Shannon has been struggling with her weight for many years and has been unsuccessful in either losing weight, maintaining weight loss, or reaching her healthy weight goal.  Meredith Shannon is currently in the action stage of change and ready to dedicate time achieving and maintaining a healthier weight. Meredith Shannon is interested in becoming our patient and working on intensive lifestyle modifications including (but not limited to) diet and exercise for weight loss.  Meredith Shannon does like to cook, ub tnotes time as an obstacle.  She craves sweets, mostly at night.  Meredith Shannon's habits were reviewed today and are as follows: Her family eats meals together, she thinks her family will eat healthier with her, her desired weight loss is 40 pounds, she has been heavy most of her life, she started gaining weight after pregnancy, her heaviest weight ever was 220 pounds, she craves sweets, she has problems with excessive hunger, she frequently eats larger portions than normal and she struggles with emotional eating.  Depression Screen Meredith Shannon's Food and Mood (modified PHQ-9) score was 14.  Depression screen Meredith Shannon 2/9 08/15/2020  Decreased Interest 2  Down, Depressed, Hopeless 3  PHQ - 2 Score 5  Altered sleeping 3  Tired, decreased energy 3  Change in appetite 1  Feeling bad or failure about yourself  1  Trouble concentrating 1  Moving slowly or fidgety/restless 0  Suicidal thoughts 0  PHQ-9 Score 14  Difficult doing work/chores Somewhat difficult   Subjective:   1. Other fatigue Meredith Shannon admits to daytime somnolence and reports waking up still tired. Patent has a history of symptoms of daytime fatigue, morning fatigue, morning headache and snoring. Meredith Shannon generally gets 5 or 6 hours of sleep per night, and states that she  has poor quality sleep. Snoring is present. Apneic episodes are present. Epworth Sleepiness Score is 10.  2. SOB (shortness of breath) on exertion Meredith Shannon notes increasing shortness of breath with exercising and seems to be worsening over time with weight gain. She notes getting out of breath sooner with activity than she used to. This has gotten worse recently. Meredith Shannon denies shortness of breath at rest or orthopnea.  3. Other hyperlipidemia Meredith Shannon has hyperlipidemia and has been trying to improve her cholesterol levels with intensive lifestyle modification including a low saturated fat diet, exercise and weight loss. She denies any chest pain, claudication or myalgias.  Meredith Shannon is taking Crestor.  4. Vitamin D deficiency Meredith Shannon's Vitamin D level was 42.6 on 03/02/2020. She is currently taking OTC vitamin D 2,000 IU each day. She denies nausea, vomiting or muscle weakness.  5. Gastroesophageal reflux disease, unspecified whether esophagitis present Meredith Shannon is taking pantoprazole 40 mg daily.  6. Elevated glucose Meredith Shannon says she had an increased appetite over the holidays.  7. Other sleep apnea Meredith Shannon endorses snoring and stops breathing in her sleep.  8. Depression screening Meredith Shannon was screened for depression as part of her new patient workup today.  PHQ-9 is 14.  9. At risk for activity intolerance Meredith Shannon is at risk for activity intolerance due to obesity and weather changes.  Assessment/Plan:   1. Other fatigue Meredith Shannon does feel that her weight is causing her energy to be lower than it should be. Fatigue may be related to obesity, depression or many other causes. Labs will be ordered,  and in the meanwhile, Meredith Shannon will focus on self care including making healthy food choices, increasing physical activity and focusing on stress reduction.  Gradually increase activities.   - EKG 12-Lead - Vitamin B12 - CBC with Differential/Platelet - Comprehensive metabolic panel - Hemoglobin A1c - Insulin,  random - Lipid Panel With LDL/HDL Ratio - T3 - T4, free - TSH - VITAMIN D 25 Hydroxy (Vit-D Deficiency, Fractures)  2. SOB (shortness of breath) on exertion Meredith Shannon does feel that she gets out of breath more easily that she used to when she exercises. Meredith Shannon's shortness of breath appears to be obesity related and exercise induced. She has agreed to work on weight loss and gradually increase exercise to treat her exercise induced shortness of breath. Will continue to monitor closely.  - Lipid Panel With LDL/HDL Ratio  3. Other hyperlipidemia Cardiovascular risk and specific lipid/LDL goals reviewed.  We discussed several lifestyle modifications today and Meredith Shannon will continue to work on diet, exercise and weight loss efforts. Orders and follow up as documented in patient record.  Continue Crestor.  Will check CMP and lipid panel today.  Counseling Intensive lifestyle modifications are the first line treatment for this issue. . Dietary changes: Increase soluble fiber. Decrease simple carbohydrates. . Exercise changes: Moderate to vigorous-intensity aerobic activity 150 minutes per week if tolerated. . Lipid-lowering medications: see documented in medical record.  - Comprehensive metabolic panel - Lipid Panel With LDL/HDL Ratio  4. Vitamin D deficiency Low Vitamin D level contributes to fatigue and are associated with obesity, breast, and colon cancer.  She will continue vitamin D and we will check her vitamin D level today.  - VITAMIN D 25 Hydroxy (Vit-D Deficiency, Fractures)  5. Gastroesophageal reflux disease, unspecified whether esophagitis present Intensive lifestyle modifications are the first line treatment for this issue. We discussed several lifestyle modifications today and she will continue to work on diet, exercise and weight loss efforts. Orders and follow up as documented in patient record.  Continue pantoprazole.  Counseling . If a person has gastroesophageal reflux disease  (GERD), food and stomach acid move back up into the esophagus and cause symptoms or problems such as damage to the esophagus. . Anti-reflux measures include: raising the head of the bed, avoiding tight clothing or belts, avoiding eating late at night, not lying down shortly after mealtime, and achieving weight loss. . Avoid ASA, NSAID's, caffeine, alcohol, and tobacco.  . OTC Pepcid and/or Tums are often very helpful for as needed use.  Marland Kitchen However, for persisting chronic or daily symptoms, stronger medications like Omeprazole may be needed. . You may need to avoid foods and drinks such as: ? Coffee and tea (with or without caffeine). ? Drinks that contain alcohol. ? Energy drinks and sports drinks. ? Bubbly (carbonated) drinks or sodas. ? Chocolate and cocoa. ? Peppermint and mint flavorings. ? Garlic and onions. ? Horseradish. ? Spicy and acidic foods. These include peppers, chili powder, curry powder, vinegar, hot sauces, and BBQ sauce. ? Citrus fruit juices and citrus fruits, such as oranges, lemons, and limes. ? Tomato-based foods. These include red sauce, chili, salsa, and pizza with red sauce. ? Fried and fatty foods. These include donuts, french fries, potato chips, and high-fat dressings. ? High-fat meats. These include hot dogs, rib eye steak, sausage, ham, and bacon.  6. Elevated glucose Will check A1c and insulin level today.  - Hemoglobin A1c - Insulin, random  7. Other sleep apnea Will get Meredith Shannon scheduled for a sleep study.  8. Depression screening Estelita had a positive depression screening. Depression is commonly associated with obesity and often results in emotional eating behaviors. We will monitor this closely and work on CBT to help improve the non-hunger eating patterns. Referral to Psychology may be required if no improvement is seen as she continues in our clinic.  9. At risk for activity intolerance Clayborne Danaatti was given approximately 15 minutes of exercise intolerance  counseling today. She is 65 y.o. female and has risk factors exercise intolerance including obesity. We discussed intensive lifestyle modifications today with an emphasis on specific weight loss instructions and strategies. Clayborne Danaatti will slowly increase activity as tolerated.  Repetitive spaced learning was employed today to elicit superior memory formation and behavioral change.  10. Class 1 obesity with serious comorbidity and body mass index (BMI) of 30.0 to 30.9 in adult, unspecified obesity type  Clayborne Danaatti is currently in the action stage of change and her goal is to continue with weight loss efforts. I recommend Clayborne Danaatti begin the structured treatment plan as follows:  She has agreed to the Category 1 Plan +100 calories.  She will work on meal planning and decreasing fast foods and portion sizes.  Labs from 03/02/2020, including CMP, lipid panel, vitamin D, CBC, glucose, and TSH were reviewed independently and discussed with the patient.  Exercise goals: No exercise has been prescribed at this time.   Behavioral modification strategies: increasing lean protein intake, decreasing simple carbohydrates, increasing vegetables, increasing water intake, decreasing eating out, no skipping meals, meal planning and cooking strategies, keeping healthy foods in the home and planning for success.  She was informed of the importance of frequent follow-up visits to maximize her success with intensive lifestyle modifications for her multiple health conditions. She was informed we would discuss her lab results at her next visit unless there is a critical issue that needs to be addressed sooner. Liylah agreed to keep her next visit at the agreed upon time to discuss these results.  Objective:   Blood pressure (!) 145/84, pulse 82, temperature 98 F (36.7 C), height 5\' 10"  (1.778 m), weight 215 lb (97.5 kg), last menstrual period 08/04/2006, SpO2 98 %. Body mass index is 30.85 kg/m.  EKG: Normal sinus rhythm, rate  89 bpm.  Indirect Calorimeter completed today shows a VO2 of 220 and a REE of 1529.  Her calculated basal metabolic rate is [id number] thus her basal metabolic rate is worse than expected.  General: Cooperative, alert, well developed, in no acute distress. HEENT: Conjunctivae and lids unremarkable. Cardiovascular: Regular rhythm.  Lungs: Normal work of breathing. Neurologic: No focal deficits.   Lab Results  Component Value Date   CREATININE 0.90 03/02/2020   BUN 14 03/02/2020   NA 140 03/02/2020   K 4.5 03/02/2020   CL 101 03/02/2020   CO2 25 03/02/2020   Lab Results  Component Value Date   ALT 17 03/02/2020   AST 17 03/02/2020   ALKPHOS 74 03/02/2020   BILITOT 0.6 03/02/2020   Lab Results  Component Value Date   TSH 0.763 03/02/2020   Lab Results  Component Value Date   CHOL 224 (H) 03/02/2020   HDL 64 03/02/2020   LDLCALC 130 (H) 03/02/2020   TRIG 169 (H) 03/02/2020   CHOLHDL 3.5 03/02/2020   Lab Results  Component Value Date   WBC 6.4 03/02/2020   HGB 15.8 03/02/2020   HCT 47.2 (H) 03/02/2020   MCV 96 03/02/2020   PLT 226 03/02/2020   Attestation Statements:  Reviewed by clinician on day of visit: allergies, medications, problem list, medical history, surgical history, family history, social history, and previous encounter notes.  I, Water quality scientist, CMA, am acting as Location manager for [employer], DO  I have reviewed the above documentation for accuracy and completeness, and I agree with the above. Jearld Lesch, DO

## 2020-08-22 ENCOUNTER — Encounter (INDEPENDENT_AMBULATORY_CARE_PROVIDER_SITE_OTHER): Payer: Self-pay | Admitting: Bariatrics

## 2020-08-22 DIAGNOSIS — K219 Gastro-esophageal reflux disease without esophagitis: Secondary | ICD-10-CM | POA: Insufficient documentation

## 2020-08-22 DIAGNOSIS — E6609 Other obesity due to excess calories: Secondary | ICD-10-CM | POA: Insufficient documentation

## 2020-08-22 DIAGNOSIS — R1310 Dysphagia, unspecified: Secondary | ICD-10-CM | POA: Insufficient documentation

## 2020-08-23 ENCOUNTER — Ambulatory Visit (INDEPENDENT_AMBULATORY_CARE_PROVIDER_SITE_OTHER): Payer: BC Managed Care – PPO

## 2020-08-23 ENCOUNTER — Other Ambulatory Visit: Payer: Self-pay

## 2020-08-23 ENCOUNTER — Encounter: Payer: Self-pay | Admitting: Podiatry

## 2020-08-23 ENCOUNTER — Ambulatory Visit: Payer: BC Managed Care – PPO | Admitting: Podiatry

## 2020-08-23 DIAGNOSIS — M79671 Pain in right foot: Secondary | ICD-10-CM

## 2020-08-23 DIAGNOSIS — M21619 Bunion of unspecified foot: Secondary | ICD-10-CM

## 2020-08-23 DIAGNOSIS — M79672 Pain in left foot: Secondary | ICD-10-CM | POA: Diagnosis not present

## 2020-08-24 NOTE — Progress Notes (Signed)
Subjective:   Patient ID: Meredith Shannon, female   DOB: 65 y.o.   MRN: [id number]   HPI Patient presents stating she has developed an increasingly painful bunion deformity left over right and its been present for a long time and is worsened over the last 8 months to 1 year.  States she has trouble wearing shoes is tried wider shoes has tried soaks and anti-inflammatories without relief and patient does not smoke and does like to be active if possible   Review of Systems  All other systems reviewed and are negative.       Objective:  Physical Exam Vitals and nursing note reviewed.  Constitutional:      Appearance: She is well-developed and well-nourished.  Cardiovascular:     Pulses: Intact distal pulses.  Pulmonary:     Effort: Pulmonary effort is normal.  Musculoskeletal:        General: Normal range of motion.  Skin:    General: Skin is warm.  Neurological:     Mental Status: She is alert.     Neurovascular status intact muscle strength was found to be adequate range of motion found to be adequate.  Patient is found to have prominent first metatarsal head left over right with redness around the first metatarsal and pain with palpation left over right.  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  At this point I do think structural bunion correction would be in her best interest and she wants to have this done and is motivated to do it soon due to the fact she wants to be better for strength.  At this time since she is defined in what she wants I did allow her to read consent form going over alternative treatments complications and spent a great  deal of time discussing this with her and reviewing the x-ray.  She signed consent form understands no guarantee as far as success of surgery understands all problems that can occur and the fact that total recovery can take 6 months to 1 year.  Patient signed consent form after extensive review and is scheduled for outpatient surgery and I did dispense air fracture walker that I want her to get used to prior to procedure and I want her to find a shoe on the other foot that fits well for her.  Patient is encouraged to call with questions concerns which may arise prior to surgery  X-rays indicate that there is elevation of the intermetatarsal angle with a small calcified area on the medial side of the left first metatarsal which may be part of the symptoms she experiences

## 2020-08-29 ENCOUNTER — Ambulatory Visit (INDEPENDENT_AMBULATORY_CARE_PROVIDER_SITE_OTHER): Payer: BC Managed Care – PPO | Admitting: Bariatrics

## 2020-08-29 ENCOUNTER — Encounter (INDEPENDENT_AMBULATORY_CARE_PROVIDER_SITE_OTHER): Payer: Self-pay | Admitting: Bariatrics

## 2020-08-29 ENCOUNTER — Other Ambulatory Visit: Payer: Self-pay

## 2020-08-29 VITALS — BP 146/88 | HR 75 | Temp 97.9°F | Ht 70.0 in | Wt 213.0 lb

## 2020-08-29 DIAGNOSIS — D582 Other hemoglobinopathies: Secondary | ICD-10-CM | POA: Diagnosis not present

## 2020-08-29 DIAGNOSIS — E7800 Pure hypercholesterolemia, unspecified: Secondary | ICD-10-CM

## 2020-08-29 DIAGNOSIS — Z6830 Body mass index (BMI) 30.0-30.9, adult: Secondary | ICD-10-CM

## 2020-08-29 DIAGNOSIS — E559 Vitamin D deficiency, unspecified: Secondary | ICD-10-CM | POA: Diagnosis not present

## 2020-08-29 DIAGNOSIS — E538 Deficiency of other specified B group vitamins: Secondary | ICD-10-CM | POA: Diagnosis not present

## 2020-08-29 DIAGNOSIS — E669 Obesity, unspecified: Secondary | ICD-10-CM

## 2020-08-29 DIAGNOSIS — Z9189 Other specified personal risk factors, not elsewhere classified: Secondary | ICD-10-CM | POA: Diagnosis not present

## 2020-08-29 DIAGNOSIS — R0683 Snoring: Secondary | ICD-10-CM

## 2020-08-30 ENCOUNTER — Encounter (INDEPENDENT_AMBULATORY_CARE_PROVIDER_SITE_OTHER): Payer: Self-pay | Admitting: Bariatrics

## 2020-08-30 NOTE — Progress Notes (Signed)
Chief Complaint:   OBESITY Meredith Shannon is here to discuss her progress with her obesity treatment plan along with follow-up of her obesity related diagnoses. Meredith Shannon is on the Category 1 Plan and states she is following her eating plan approximately 50% of the time. Meredith Shannon states she is not currently exercising.  Today's visit was #: 2 Starting weight: 215 lbs Starting date: 08/15/2020 Today's weight: 213 lbs Today's date: 08/29/2020 Total lbs lost to date: 2 lbs Total lbs lost since last in-office visit: 2 lbs  Interim History: Meredith Shannon is down 2 pounds since her last visit.  She has stayed away from fast food.  Subjective:   1. Elevated cholesterol Meredith Shannon has hyperlipidemia and has been trying to improve her cholesterol levels with intensive lifestyle modification including a low saturated fat diet, exercise and weight loss. She denies any chest pain, claudication or myalgias.  She is taking rosuvastatin 20 mg daily.  Lab Results  Component Value Date   ALT 19 08/15/2020   AST 20 08/15/2020   ALKPHOS 76 08/15/2020   BILITOT 0.3 08/15/2020   Lab Results  Component Value Date   CHOL 255 (H) 08/15/2020   HDL 68 08/15/2020   LDLCALC 166 (H) 08/15/2020   TRIG 118 08/15/2020   CHOLHDL 3.5 03/02/2020   2. B12 deficiency Borderline low at 239.  Lab Results  Component Value Date   VITAMINB12 239 08/15/2020   3. Elevated hemoglobin (HCC) and Hct She is not on a multivitamin.  4. Vitamin D deficiency Farheen's Vitamin D level was 48.5 on 08/15/2020. She is currently taking OTC vitamin D 2,000 IU each day. She denies nausea, vomiting or muscle weakness.  5. Snoring She stops breathing at times at night.  6. At risk for heart disease Meredith Shannon is at a higher than average risk for cardiovascular disease due to obesity and elevated cholesterol.   Assessment/Plan:   1. Elevated cholesterol Cardiovascular risk and specific lipid/LDL goals reviewed.  We discussed several lifestyle  modifications today and Hind will continue to work on diet, exercise and weight loss efforts. Orders and follow up as documented in patient record.  Continue medications and PCP may increase her dose. In the future.   Counseling Intensive lifestyle modifications are the first line treatment for this issue. . Dietary changes: Increase soluble fiber. Decrease simple carbohydrates. . Exercise changes: Moderate to vigorous-intensity aerobic activity 150 minutes per week if tolerated. . Lipid-lowering medications: see documented in medical record.  2. B12 deficiency The diagnosis was reviewed with the patient. Counseling provided today, see below. We will continue to monitor. Orders and follow up as documented in patient record.  Start OTC vitamin B12 500 mcg daily.  Counseling . The body needs vitamin B12: to make red blood cells; to make DNA; and to help the nerves work properly so they can carry messages from the brain to the body.  . The main causes of vitamin B12 deficiency include dietary deficiency, digestive diseases, pernicious anemia, and having a surgery in which part of the stomach or small intestine is removed.  . Certain medicines can make it harder for the body to absorb vitamin B12. These medicines include: heartburn medications; some antibiotics; some medications used to treat diabetes, gout, and high cholesterol.  . In some cases, there are no symptoms of this condition. If the condition leads to anemia or nerve damage, various symptoms can occur, such as weakness or fatigue, shortness of breath, and numbness or tingling in your hands and  feet.   . Treatment:  o May include taking vitamin B12 supplements.  o Avoid alcohol.  o Eat lots of healthy foods that contain vitamin B12: - Beef, pork, chicken, [place], and organ meats, such as liver.  - Seafood: This includes clams, rainbow trout, salmon, tuna, and haddock. Eggs.  - Cereal and dairy products that are fortified: This means  that vitamin B12 has been added to the food.   3. Elevated hemoglobin (HCC) and Hct Will place referral to hematology to evaluate elevation in Hgb and Hct.  4. Vitamin D deficiency Low Vitamin D level contributes to fatigue and are associated with obesity, breast, and colon cancer.  Continue vitamin D.  5. Snoring Will refer to [facility] for evaluation.   6. At risk for heart disease Meredith Shannon was given approximately 15 minutes of coronary artery disease prevention counseling today. She is 65 y.o. female and has risk factors for heart disease including obesity. We discussed intensive lifestyle modifications today with an emphasis on specific weight loss instructions and strategies.   Repetitive spaced learning was employed today to elicit superior memory formation and behavioral change.  7. Class 1 obesity with serious comorbidity and body mass index (BMI) of 30.0 to 30.9 in adult, unspecified obesity type  Meredith Shannon is currently in the action stage of change. As such, her goal is to continue with weight loss efforts. She has agreed to the Category 1 Plan.  She will work on meal planning, weighing her meat, and protein equivalents.  Labs from 08/15/2020, including CMP, lipid panel, vitamin D, B12, CBC, A1c, and thyroid panel were reviewed with the patient today.   Exercise goals: All adults should avoid inactivity. Some physical activity is better than none, and adults who participate in any amount of physical activity gain some health benefits.  Behavioral modification strategies: increasing lean protein intake, decreasing simple carbohydrates, increasing vegetables, increasing water intake, decreasing eating out, no skipping meals, meal planning and cooking strategies, keeping healthy foods in the home and planning for success.  Attallah has agreed to follow-up with our clinic in 2 weeks. She was informed of the importance of frequent follow-up visits to maximize her success with intensive  lifestyle modifications for her multiple health conditions.   Objective:   Blood pressure (!) 146/88, pulse 75, temperature 97.9 F (36.6 C), height 5\' 10"  (1.778 m), weight 213 lb (96.6 kg), last menstrual period 08/04/2006, SpO2 98 %. Body mass index is 30.56 kg/m.  General: Cooperative, alert, well developed, in no acute distress. HEENT: Conjunctivae and lids unremarkable. Cardiovascular: Regular rhythm.  Lungs: Normal work of breathing. Neurologic: No focal deficits.   Lab Results  Component Value Date   CREATININE 0.93 08/15/2020   BUN 20 08/15/2020   NA 142 08/15/2020   K 4.7 08/15/2020   CL 102 08/15/2020   CO2 22 08/15/2020   Lab Results  Component Value Date   ALT 19 08/15/2020   AST 20 08/15/2020   ALKPHOS 76 08/15/2020   BILITOT 0.3 08/15/2020   Lab Results  Component Value Date   HGBA1C 5.6 08/15/2020   Lab Results  Component Value Date   INSULIN 8.2 08/15/2020   Lab Results  Component Value Date   TSH 0.788 08/15/2020   Lab Results  Component Value Date   CHOL 255 (H) 08/15/2020   HDL 68 08/15/2020   LDLCALC 166 (H) 08/15/2020   TRIG 118 08/15/2020   CHOLHDL 3.5 03/02/2020   Lab Results  Component Value Date  WBC 6.3 08/15/2020   HGB 16.3 (H) 08/15/2020   HCT 48.0 (H) 08/15/2020   MCV 94 08/15/2020   PLT 188 08/15/2020   Attestation Statements:   Reviewed by clinician on day of visit: allergies, medications, problem list, medical history, surgical history, family history, social history, and previous encounter notes.  I, Water quality scientist, CMA, am acting as Location manager for [employer], DO  I have reviewed the above documentation for accuracy and completeness, and I agree with the above. Jearld Lesch, DO

## 2020-08-31 ENCOUNTER — Telehealth: Payer: Self-pay | Admitting: Hematology and Oncology

## 2020-08-31 NOTE — Telephone Encounter (Signed)
Received a new hem referral from Dr. Jearld Lesch for elevated hgb. Pt has been cld and scheduled to see Dr. Lindi Adie on 2/15 at 345pm. Pt aware to arrive 20 minutes early.

## 2020-09-03 ENCOUNTER — Telehealth: Payer: Self-pay

## 2020-09-03 MED ORDER — ONDANSETRON HCL 4 MG PO TABS: 4.0000 mg | ORAL_TABLET | Freq: Three times a day (TID) | ORAL | 0 refills | Status: DC | PRN

## 2020-09-03 MED ORDER — OXYCODONE-ACETAMINOPHEN 10-325 MG PO TABS: 1.0000 | ORAL_TABLET | ORAL | 0 refills | Status: DC | PRN

## 2020-09-03 NOTE — Telephone Encounter (Signed)
DOS 09/04/2020  [place] BUNIONECTOMY LT - 28296  [employer] EFFECTIVE DATE - 08/04/2020  PLAN DEDUCTIBLE - $950.00 W/ $577.00 REMAINING OUT OF POCKET - $3250.00 W/ $2797.00 REMAINING COPAY $0.00 COINSURANCE - 20%  AUTH #[id number] GOOD FROM 08/29/2020 - 09/29/2020 FOR CPT 43568

## 2020-09-03 NOTE — Addendum Note (Signed)
Addended by: Wallene Huh on: 09/03/2020 05:43 PM   Modules accepted: Orders

## 2020-09-04 DIAGNOSIS — M898X7 Other specified disorders of bone, ankle and foot: Secondary | ICD-10-CM | POA: Diagnosis not present

## 2020-09-04 DIAGNOSIS — M2012 Hallux valgus (acquired), left foot: Secondary | ICD-10-CM | POA: Diagnosis not present

## 2020-09-10 ENCOUNTER — Ambulatory Visit (INDEPENDENT_AMBULATORY_CARE_PROVIDER_SITE_OTHER): Payer: BC Managed Care – PPO

## 2020-09-10 ENCOUNTER — Other Ambulatory Visit: Payer: Self-pay

## 2020-09-10 ENCOUNTER — Ambulatory Visit (INDEPENDENT_AMBULATORY_CARE_PROVIDER_SITE_OTHER): Payer: BC Managed Care – PPO | Admitting: Podiatry

## 2020-09-10 ENCOUNTER — Encounter: Payer: Self-pay | Admitting: Podiatry

## 2020-09-10 DIAGNOSIS — M79671 Pain in right foot: Secondary | ICD-10-CM

## 2020-09-10 DIAGNOSIS — M79672 Pain in left foot: Secondary | ICD-10-CM

## 2020-09-11 ENCOUNTER — Telehealth: Payer: Self-pay | Admitting: Hematology and Oncology

## 2020-09-11 ENCOUNTER — Encounter: Payer: Self-pay | Admitting: Podiatry

## 2020-09-11 NOTE — Telephone Encounter (Signed)
Ms. Asper has been cld and rescheduled to see Dr. Alvy Bimler on 2/11 at 1pm. Pt aware to arrive 30 minutes early.

## 2020-09-11 NOTE — Progress Notes (Signed)
Subjective:   Patient ID: Meredith Shannon, female   DOB: 65 y.o.   MRN: [id number]   HPI Patient presents stating doing very well with surgery very pleased   ROS      Objective:  Physical Exam  Neurovascular status intact negative Bevelyn Buckles' sign noted left foot healing well wound edges well coapted hallux in rectus position good range of motion     Assessment:  Doing well post osteotomy left first metatarsal good alignment good range of motion      Plan:  H&P x-rays reviewed sterile dressing reapplied continue elevation compression immobilization reappoint 2 weeks suture removal earlier if needed  X-rays indicate osteotomies healing well fixation in place joint congruence

## 2020-09-13 ENCOUNTER — Ambulatory Visit (INDEPENDENT_AMBULATORY_CARE_PROVIDER_SITE_OTHER): Payer: BC Managed Care – PPO | Admitting: Bariatrics

## 2020-09-13 ENCOUNTER — Encounter (INDEPENDENT_AMBULATORY_CARE_PROVIDER_SITE_OTHER): Payer: Self-pay | Admitting: Bariatrics

## 2020-09-13 ENCOUNTER — Other Ambulatory Visit: Payer: Self-pay

## 2020-09-13 VITALS — BP 131/84 | HR 76 | Temp 97.8°F | Ht 70.0 in | Wt 211.0 lb

## 2020-09-13 DIAGNOSIS — E669 Obesity, unspecified: Secondary | ICD-10-CM

## 2020-09-13 DIAGNOSIS — E7849 Other hyperlipidemia: Secondary | ICD-10-CM

## 2020-09-13 DIAGNOSIS — Z6831 Body mass index (BMI) 31.0-31.9, adult: Secondary | ICD-10-CM | POA: Diagnosis not present

## 2020-09-13 DIAGNOSIS — E559 Vitamin D deficiency, unspecified: Secondary | ICD-10-CM | POA: Diagnosis not present

## 2020-09-14 ENCOUNTER — Other Ambulatory Visit: Payer: Self-pay

## 2020-09-14 ENCOUNTER — Encounter: Payer: Self-pay | Admitting: Hematology and Oncology

## 2020-09-14 ENCOUNTER — Inpatient Hospital Stay: Payer: BC Managed Care – PPO | Attending: Hematology and Oncology | Admitting: Hematology and Oncology

## 2020-09-14 DIAGNOSIS — G4730 Sleep apnea, unspecified: Secondary | ICD-10-CM | POA: Insufficient documentation

## 2020-09-14 DIAGNOSIS — Z87891 Personal history of nicotine dependence: Secondary | ICD-10-CM | POA: Insufficient documentation

## 2020-09-14 DIAGNOSIS — E7800 Pure hypercholesterolemia, unspecified: Secondary | ICD-10-CM | POA: Insufficient documentation

## 2020-09-14 DIAGNOSIS — D751 Secondary polycythemia: Secondary | ICD-10-CM | POA: Insufficient documentation

## 2020-09-14 DIAGNOSIS — Z79899 Other long term (current) drug therapy: Secondary | ICD-10-CM | POA: Insufficient documentation

## 2020-09-14 DIAGNOSIS — I2510 Atherosclerotic heart disease of native coronary artery without angina pectoris: Secondary | ICD-10-CM | POA: Diagnosis not present

## 2020-09-14 NOTE — Assessment & Plan Note (Signed)
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

## 2020-09-14 NOTE — Progress Notes (Signed)
[place] NOTE  Patient Care Team: Aletha Halim., PA-C as PCP - General (Family Medicine) Megan Salon, MD as Consulting Physician (Gynecology)  CHIEF COMPLAINTS/PURPOSE OF CONSULTATION:  Erythrocytosis  HISTORY OF PRESENTING ILLNESS:  Meredith Shannon 65 y.o. female is here because of elevated hemoglobin.  She was found to have abnormal CBC from recent blood draw I have reviewed his CBC all the way to 2008 and she has intermittent erythrocytosis over the years She denies intermittent headaches, shortness of breath on exertion, frequent leg cramps and occasional chest pain.  The patient was evaluated by cardiologist in 2018 with CT imaging for coronary calcium score and was deemed moderate risk She never suffer from diagnosis of blood clot.  There is no prior diagnosis of obstructive sleep apnea, but was observed to have snoring and apnea by her husband and she is currently in the process of being evaluated with sleep study next month. The patient denies skin itching. She follows at weight management center and has successfully lost some weight recently. The patient is not a smoker and she does not have lung disease  MEDICAL HISTORY:  Past Medical History:  Diagnosis Date  . Anxiety   . Broken wrist 2008  . Cancer ([place])    basal cell  . Chest pain 2018   seen by Dr. Ellyn Hack.  Has mid LAD stenosis.  . Chest pain   . Complication of anesthesia    in recover room once when waking up slow to exhale per patient   . Constipation   . GERD (gastroesophageal reflux disease)    occasional uses tums  . High cholesterol    In total cholesterol and triglycerides.  . High triglycerides   . HSV-1 infection   . Joint pain   . Lichen simplex chronicus   . Sleep apnea   . Swallowing difficulty   . Vitamin D deficiency     SURGICAL HISTORY: Past Surgical History:  Procedure Laterality Date  . BREAST BIOPSY  5/12   fibrocystic changes  . CYSTOSCOPY N/A  03/09/2017   Procedure: CYSTOSCOPY;  Surgeon: Megan Salon, MD;  Location: [facility];  Service: Gynecology;  Laterality: N/A;  . DILATATION & CURETTAGE/HYSTEROSCOPY WITH MYOSURE N/A 12/26/2016   Procedure: [place];  Surgeon: Megan Salon, MD;  Location: [facility];  Service: Gynecology;  Laterality: N/A;  . DILATION AND CURETTAGE OF UTERUS     "years ago"  . FACIAL COSMETIC SURGERY    . HAND SURGERY Right 2012   fractured bone in hands  . LAPAROSCOPIC HYSTERECTOMY N/A 03/09/2017   Procedure: HYSTERECTOMY TOTAL LAPAROSCOPIC;  Surgeon: Megan Salon, MD;  Location: [facility];  Service: Gynecology;  Laterality: N/A;  . LAPAROSCOPIC SALPINGO OOPHERECTOMY Bilateral 03/09/2017   Procedure: LAPAROSCOPIC SALPINGO OOPHORECTOMY;  Surgeon: Megan Salon, MD;  Location: [facility];  Service: Gynecology;  Laterality: Bilateral;  . TUBAL LIGATION  1983  . WRIST FRACTURE SURGERY Right 2008    SOCIAL HISTORY: Social History   Socioeconomic History  . Marital status: Married    Spouse name: Deidre Ala  . Number of children: 1  . Years of education: Not on file  . Highest education level: Not on file  Occupational History  . 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 Smoking status: Former Smoker    Packs/day: 0.50    Types: Cigarettes    Quit date: 08/04/1984  Years since quitting: 36.1  . Smokeless tobacco: Never Used  Vaping Use  . Vaping Use: Never used  Substance and Sexual Activity  . Alcohol use: Yes    Alcohol/week: 0.0 - 2.0 standard drinks  . Drug use: No  . Sexual activity: Not Currently    Partners: Male    Birth control/protection: Post-menopausal, Surgical    Comment: [place] 03/09/17   Other Topics Concern  . Not on file  Social History Narrative  . Not on file   Social Determinants of Health   Financial Resource Strain: Not on file  Food Insecurity: Not on file  Transportation Needs: Not  on file  Physical Activity: Not on file  Stress: Not on file  Social Connections: Not on file  Intimate Partner Violence: Not on file    FAMILY HISTORY: Family History  Problem Relation Age of Onset  . Osteoporosis Mother   . High blood pressure Mother   . High Cholesterol Mother   . Depression Mother   . Sudden death Father   . Alcohol abuse Father     ALLERGIES:  is allergic to bee venom and penicillins.  MEDICATIONS:  Current Outpatient Medications  Medication Sig Dispense Refill  . vitamin B-12 (CYANOCOBALAMIN) 1000 MCG tablet Take 1,000 mcg by mouth daily.    Marland Kitchen acyclovir (ZOVIRAX) 800 MG tablet Take 800 mg by mouth daily.     . Calcium Carb-Cholecalciferol (CALCIUM + D3 PO) Take 1 tablet by mouth daily.    . Cholecalciferol (VITAMIN D) 2000 UNITS CAPS Take 2,000 Units by mouth daily.     . pantoprazole (PROTONIX) 40 MG tablet Take 1 tablet by mouth daily.    . rosuvastatin (CRESTOR) 20 MG tablet Take 20 mg by mouth daily.    . vitamin E 1000 UNIT capsule Take 1,000 Units by mouth daily.     No current facility-administered medications for this visit.    REVIEW OF SYSTEMS:   Constitutional: Denies fevers, chills or abnormal night sweats Eyes: Denies blurriness of vision, double vision or watery eyes Ears, nose, mouth, throat, and face: Denies mucositis or sore throat Respiratory: Denies cough, dyspnea or wheezes Cardiovascular: Denies palpitation, chest discomfort or lower extremity swelling Gastrointestinal:  Denies nausea, heartburn or change in bowel habits Skin: Denies abnormal skin rashes Lymphatics: Denies new lymphadenopathy or easy bruising Neurological:Denies numbness, tingling or new weaknesses Behavioral/Psych: Mood is stable, no new changes  All other systems were reviewed with the patient and are negative.  PHYSICAL EXAMINATION: ECOG PERFORMANCE STATUS: 0 - Asymptomatic  Vitals:   09/14/20 1303  BP: (!) 155/75  Pulse: 83  Resp: 18  Temp: 98.3 F  (36.8 C)  SpO2: 99%   Filed Weights   09/14/20 1303  Weight: 217 lb 9.6 oz (98.7 kg)    GENERAL:alert, no distress and comfortable SKIN: skin color, texture, turgor are normal, no rashes or significant lesions EYES: normal, conjunctiva are pink and non-injected, sclera clear OROPHARYNX:no exudate, no erythema and lips, buccal mucosa, and tongue normal  NECK: supple, thyroid normal size, non-tender, without nodularity LYMPH:  no palpable lymphadenopathy in the cervical, axillary or inguinal LUNGS: clear to auscultation and percussion with normal breathing effort HEART: regular rate & rhythm and no murmurs and no lower extremity edema ABDOMEN:abdomen soft, non-tender and normal bowel sounds Musculoskeletal:no cyanosis of digits and no clubbing  PSYCH: alert & oriented x 3 with fluent speech NEURO: no focal motor/sensory deficits  LABORATORY DATA:  I have reviewed the data as listed  Recent Results (from the past 2160 hour(s))  Vitamin B12     Status: None   Collection Time: 08/15/20  2:06 PM  Result Value Ref Range   Vitamin B-12 239 232 - 1,245 pg/mL  CBC with Differential/Platelet     Status: Abnormal   Collection Time: 08/15/20  2:06 PM  Result Value Ref Range   WBC 6.3 3.4 - 10.8 x10E3/uL   RBC 5.12 3.77 - 5.28 x10E6/uL   Hemoglobin 16.3 (H) 11.1 - 15.9 g/dL   Hematocrit 48.0 (H) 34.0 - 46.6 %   MCV 94 79 - 97 fL   MCH 31.8 26.6 - 33.0 pg   MCHC 34.0 31.5 - 35.7 g/dL   RDW 12.6 11.7 - 15.4 %   Platelets 188 150 - 450 x10E3/uL   Neutrophils 60 Not Estab. %   Lymphs 30 Not Estab. %   Monocytes 7 Not Estab. %   Eos 2 Not Estab. %   Basos 1 Not Estab. %   Neutrophils Absolute 3.8 1.4 - 7.0 x10E3/uL   Lymphocytes Absolute 1.9 0.7 - 3.1 x10E3/uL   Monocytes Absolute 0.5 0.1 - 0.9 x10E3/uL   EOS (ABSOLUTE) 0.1 0.0 - 0.4 x10E3/uL   Basophils Absolute 0.0 0.0 - 0.2 x10E3/uL   Immature Granulocytes 0 Not Estab. %   Immature Grans (Abs) 0.0 0.0 - 0.1 x10E3/uL  Comprehensive  metabolic panel     Status: None   Collection Time: 08/15/20  2:06 PM  Result Value Ref Range   Glucose 95 65 - 99 mg/dL   BUN 20 8 - 27 mg/dL   Creatinine, Ser 0.93 0.57 - 1.00 mg/dL   GFR calc non Af Amer 66 >59 mL/min/1.73   GFR calc Af Amer 76 >59 mL/min/1.73    Comment: **In accordance with recommendations from the NKF-ASN Task force,**   [facility] is in the process of updating its eGFR calculation to the   2021 CKD-EPI creatinine equation that estimates kidney function   without a race variable.    BUN/Creatinine Ratio 22 12 - 28   Sodium 142 134 - 144 mmol/L   Potassium 4.7 3.5 - 5.2 mmol/L   Chloride 102 96 - 106 mmol/L   CO2 22 20 - 29 mmol/L   Calcium 9.9 8.7 - 10.3 mg/dL   Total Protein 7.2 6.0 - 8.5 g/dL   Albumin 4.8 3.8 - 4.8 g/dL   Globulin, Total 2.4 1.5 - 4.5 g/dL   Albumin/Globulin Ratio 2.0 1.2 - 2.2   Bilirubin Total 0.3 0.0 - 1.2 mg/dL   Alkaline Phosphatase 76 44 - 121 IU/L    Comment:               **Please note reference interval change**   AST 20 0 - 40 IU/L   ALT 19 0 - 32 IU/L  Hemoglobin A1c     Status: None   Collection Time: 08/15/20  2:06 PM  Result Value Ref Range   Hgb A1c MFr Bld 5.6 4.8 - 5.6 %    Comment:          Prediabetes: 5.7 - 6.4          Diabetes: >6.4          Glycemic control for adults with diabetes: <7.0    Est. average glucose Bld gHb Est-mCnc 114 mg/dL  Insulin, random     Status: None   Collection Time: 08/15/20  2:06 PM  Result Value Ref Range   INSULIN 8.2 2.6 -  24.9 uIU/mL  Lipid Panel With LDL/HDL Ratio     Status: Abnormal   Collection Time: 08/15/20  2:06 PM  Result Value Ref Range   Cholesterol, Total 255 (H) 100 - 199 mg/dL   Triglycerides 118 0 - 149 mg/dL   HDL 68 >39 mg/dL   VLDL Cholesterol Cal 21 5 - 40 mg/dL   LDL Chol Calc (NIH) 166 (H) 0 - 99 mg/dL   LDL/HDL Ratio 2.4 0.0 - 3.2 ratio    Comment:                                     LDL/HDL Ratio                                             Men  Women                                1/2 Avg.Risk  1.0    1.5                                   Avg.Risk  3.6    3.2                                2X Avg.Risk  6.2    5.0                                3X Avg.Risk  8.0    6.1   T3     Status: None   Collection Time: 08/15/20  2:06 PM  Result Value Ref Range   T3, Total 110 71 - 180 ng/dL  T4, free     Status: None   Collection Time: 08/15/20  2:06 PM  Result Value Ref Range   Free T4 1.39 0.82 - 1.77 ng/dL  TSH     Status: None   Collection Time: 08/15/20  2:06 PM  Result Value Ref Range   TSH 0.788 0.450 - 4.500 uIU/mL  VITAMIN D 25 Hydroxy (Vit-D Deficiency, Fractures)     Status: None   Collection Time: 08/15/20  2:06 PM  Result Value Ref Range   Vit D, 25-Hydroxy 48.5 30.0 - 100.0 ng/mL    Comment: Vitamin D deficiency has been defined by the [place] practice guideline as a level of serum 25-OH vitamin D less than 20 ng/mL (1,2). The Endocrine Society went on to further define vitamin D insufficiency as a level between 21 and 29 ng/mL (2). 1. IOM (Institute of Medicine). 2010. Dietary reference    intakes for calcium and D. [place]: The    [employer]. 2. Holick MF, Binkley [place], Bischoff-Ferrari HA, et al.    Evaluation, treatment, and prevention of vitamin D    deficiency: an Endocrine Society clinical practice    guideline. JCEM. 2011 Jul; 96(7):1911-30.     RADIOGRAPHIC STUDIES: I have reviewed her prior CT coronary calcium score I have personally reviewed the radiological images as listed and agreed with the  findings in the report. DG Foot 2 Views Left  Result Date: 09/11/2020 Please see detailed radiograph report in office note.  DG Foot 2 Views Left  Result Date: 08/24/2020 Please see detailed radiograph report in office note.  DG Foot 2 Views Right  Result Date: 08/24/2020 Please see detailed radiograph report in office note.   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 She does have slight increased risk of heart disease based on her cardiology evaluation several years ago I recommend 81 mg aspirin therapy I encouraged her to continue lifestyle modification and weight loss with her weight management center Sleep apnea might improve or resolve in the future with lifestyle changes and weight reduction and this will lead to resolution of her erythrocytosis   The total time spent in the appointment was 30 minutes encounter with patients including review of chart and various tests results, discussions about plan of care and coordination of care plan   All questions were answered. The patient knows to call the clinic with any problems, questions or concerns. No barriers to learning was detected.  Heath Lark, MD 2/11/20221:26 PM

## 2020-09-17 NOTE — Progress Notes (Signed)
Chief Complaint:   OBESITY Meredith Shannon is here to discuss her progress with her obesity treatment plan along with follow-up of her obesity related diagnoses. [name] is on the Category 1 Plan and states she is following her eating plan approximately 75% of the time. [name] states she is doing 0 minutes 0 times per week.  Today's visit was #: 3 Starting weight: 215 lbs Starting date: 08/15/2020 Today's weight: 211 lbs Today's date: 09/13/2020 Total lbs lost to date: 4 Total lbs lost since last in-office visit: 2  Interim History: Meredith Shannon is down an additional 2 lbs since her last visit.  Subjective:   1. Other hyperlipidemia [name] is taking Crestor and she denies myalgias.  2. Vitamin D deficiency [name] is staking OTC Vit D, and she notes minimal sun exposure.  Assessment/Plan:   1. Other hyperlipidemia Cardiovascular risk and specific lipid/LDL goals reviewed. We discussed several lifestyle modifications today. Coren will continue Crestor, and will continue to work on diet, exercise and weight loss efforts. No trans fats, and decrease saturated fats. She is to increase PUFA's and MUFA's. Orders and follow up as documented in patient record.   Counseling Intensive lifestyle modifications are the first line treatment for this issue. . Dietary changes: Increase soluble fiber. Decrease simple carbohydrates. . Exercise changes: Moderate to vigorous-intensity aerobic activity 150 minutes per week if tolerated. . Lipid-lowering medications: see documented in medical record.  2. Vitamin D deficiency Low Vitamin D level contributes to fatigue and are associated with obesity, breast, and colon cancer. [name] agreed to continue taking OTC Vitamin D and will follow-up for routine testing of Vitamin D, at least 2-3 times per year to avoid over-replacement.  3. Class 1 obesity with serious comorbidity and body mass index (BMI) of 31.0 to 31.9 in adult, unspecified obesity type Meredith Shannon is currently in  the action stage of change. As such, her goal is to continue with weight loss efforts. She has agreed to the Category 1 Plan.   We discussed intentional eating. Recipes II was given today.  Exercise goals: She will start walking outside, and also on the elliptical.  Behavioral modification strategies: increasing lean protein intake, decreasing simple carbohydrates, increasing vegetables, increasing water intake, decreasing eating out, no skipping meals, meal planning and cooking strategies, keeping healthy foods in the home and planning for success.  Meredith Shannon has agreed to follow-up with our clinic in 2 weeks. She was informed of the importance of frequent follow-up visits to maximize her success with intensive lifestyle modifications for her multiple health conditions.   Objective:   Blood pressure 131/84, pulse 76, temperature 97.8 F (36.6 C), height 5\' 10"  (1.778 m), weight 211 lb (95.7 kg), last menstrual period 08/04/2006, SpO2 99 %. Body mass index is 30.28 kg/m.  Meredith Shannon is wearing a brace on her left foot. General: Cooperative, alert, well developed, in no acute distress. HEENT: Conjunctivae and lids unremarkable. Cardiovascular: Regular rhythm.  Lungs: Normal work of breathing. Neurologic: No focal deficits.   Lab Results  Component Value Date   CREATININE 0.93 08/15/2020   BUN 20 08/15/2020   NA 142 08/15/2020   K 4.7 08/15/2020   CL 102 08/15/2020   CO2 22 08/15/2020   Lab Results  Component Value Date   ALT 19 08/15/2020   AST 20 08/15/2020   ALKPHOS 76 08/15/2020   BILITOT 0.3 08/15/2020   Lab Results  Component Value Date   HGBA1C 5.6 08/15/2020   Lab Results  Component Value Date  INSULIN 8.2 08/15/2020   Lab Results  Component Value Date   TSH 0.788 08/15/2020   Lab Results  Component Value Date   CHOL 255 (H) 08/15/2020   HDL 68 08/15/2020   LDLCALC 166 (H) 08/15/2020   TRIG 118 08/15/2020   CHOLHDL 3.5 03/02/2020   Lab Results  Component  Value Date   WBC 6.3 08/15/2020   HGB 16.3 (H) 08/15/2020   HCT 48.0 (H) 08/15/2020   MCV 94 08/15/2020   PLT 188 08/15/2020   No results found for: IRON, TIBC, FERRITIN  Attestation Statements:   Reviewed by clinician on day of visit: allergies, medications, problem list, medical history, surgical history, family history, social history, and previous encounter notes.  Time spent on visit including pre-visit chart review and post-visit care and charting was 20 minutes.    Wilhemena Durie, am acting as Location manager for [employer], DO.  I have reviewed the above documentation for accuracy and completeness, and I agree with the above. Jearld Lesch, DO

## 2020-09-18 ENCOUNTER — Encounter: Payer: Self-pay | Admitting: Hematology and Oncology

## 2020-09-19 ENCOUNTER — Encounter (INDEPENDENT_AMBULATORY_CARE_PROVIDER_SITE_OTHER): Payer: Self-pay | Admitting: Bariatrics

## 2020-10-01 ENCOUNTER — Encounter (INDEPENDENT_AMBULATORY_CARE_PROVIDER_SITE_OTHER): Payer: Self-pay | Admitting: Bariatrics

## 2020-10-01 ENCOUNTER — Ambulatory Visit (INDEPENDENT_AMBULATORY_CARE_PROVIDER_SITE_OTHER): Payer: BC Managed Care – PPO | Admitting: Bariatrics

## 2020-10-01 ENCOUNTER — Other Ambulatory Visit: Payer: Self-pay

## 2020-10-01 VITALS — BP 142/86 | HR 81 | Temp 97.9°F | Ht 70.0 in | Wt 211.0 lb

## 2020-10-01 DIAGNOSIS — Z6830 Body mass index (BMI) 30.0-30.9, adult: Secondary | ICD-10-CM

## 2020-10-01 DIAGNOSIS — D582 Other hemoglobinopathies: Secondary | ICD-10-CM

## 2020-10-01 DIAGNOSIS — E559 Vitamin D deficiency, unspecified: Secondary | ICD-10-CM

## 2020-10-01 DIAGNOSIS — E6609 Other obesity due to excess calories: Secondary | ICD-10-CM

## 2020-10-01 DIAGNOSIS — E782 Mixed hyperlipidemia: Secondary | ICD-10-CM

## 2020-10-02 ENCOUNTER — Encounter (INDEPENDENT_AMBULATORY_CARE_PROVIDER_SITE_OTHER): Payer: Self-pay | Admitting: Bariatrics

## 2020-10-02 ENCOUNTER — Ambulatory Visit (INDEPENDENT_AMBULATORY_CARE_PROVIDER_SITE_OTHER): Payer: BC Managed Care – PPO | Admitting: Neurology

## 2020-10-02 ENCOUNTER — Encounter: Payer: Self-pay | Admitting: Neurology

## 2020-10-02 VITALS — BP 137/83 | HR 79 | Ht 70.0 in | Wt 216.0 lb

## 2020-10-02 DIAGNOSIS — R351 Nocturia: Secondary | ICD-10-CM

## 2020-10-02 DIAGNOSIS — E669 Obesity, unspecified: Secondary | ICD-10-CM | POA: Diagnosis not present

## 2020-10-02 DIAGNOSIS — R0681 Apnea, not elsewhere classified: Secondary | ICD-10-CM | POA: Diagnosis not present

## 2020-10-02 DIAGNOSIS — G4719 Other hypersomnia: Secondary | ICD-10-CM

## 2020-10-02 DIAGNOSIS — E66811 Obesity, class 1: Secondary | ICD-10-CM

## 2020-10-02 DIAGNOSIS — G479 Sleep disorder, unspecified: Secondary | ICD-10-CM

## 2020-10-02 DIAGNOSIS — G478 Other sleep disorders: Secondary | ICD-10-CM

## 2020-10-02 DIAGNOSIS — R0683 Snoring: Secondary | ICD-10-CM | POA: Diagnosis not present

## 2020-10-02 NOTE — Progress Notes (Signed)
Subjective:    Patient ID: Meredith Shannon is a 65 y.o. female.  HPI     Star Age, MD, PhD [facility] [address] [address] [place], [place] [address]  Dear Dr. Owens Shark,   I saw your patient, Meredith Shannon, upon your kind request in my sleep clinic today for initial consultation of her sleep disorder, in particular, concern for underlying obstructive sleep apnea.  The patient is unaccompanied today.  As you know, [name] is a 65 year old right-handed woman with an underlying medical history of reflux disease, hyperlipidemia, vitamin D deficiency, anxiety, joint pain, and mild obesity, who reports snoring and excessive daytime somnolence. I reviewed your office note from 08/29/2020.  Her Epworth sleepiness score is 13/24, fatigue severity is 35 out of 63. She is a restless sleeper and has not woken up rested in years. She has had witnessed apneas per husband's report who has been worried about her having sleep apnea. Her husband has sleep apnea and uses a CPAP machine and has been on it successfully for years. She goes to bed between 9 and 930. She works for [employer] in [employer]. She has to go into the office for work and some travel is involved. She lives with her husband. She has a grown son. She quit smoking in 1986 and drinks alcohol 2 or 3 times a week, caffeine in the form of soda and coffee, about 2 servings altogether per day, typically none after 2 PM. She is not aware of any family history of sleep apnea. Her sleep is interrupted by having to go to the bathroom, generally she goes once a night, she denies any recurrent morning headaches. She had a tonsillectomy at age 78. She does have a TV in the bedroom and tends to fall asleep with the TV on, it is not a sleep timer. She denies any telltale symptoms of restless leg syndrome but is a restless sleeper. She tosses and turns a lot. She had recent foot surgery for bunion repair, she is  currently in a boot with the left foot, she has a follow-up coming up in about 2 weeks. They have no pets in the house.   Her Past Medical History Is Significant For: Past Medical History:  Diagnosis Date  . Anxiety   . Broken wrist 2008  . Cancer ([place])    basal cell  . Chest pain 2018   seen by Dr. Ellyn Hack.  Has mid LAD stenosis.  . Chest pain   . Complication of anesthesia    in recover room once when waking up slow to exhale per patient   . Constipation   . GERD (gastroesophageal reflux disease)    occasional uses tums  . High cholesterol    In total cholesterol and triglycerides.  . High triglycerides   . HSV-1 infection   . Joint pain   . Lichen simplex chronicus   . Sleep apnea   . Swallowing difficulty   . Vitamin D deficiency     Her Past Surgical History Is Significant For: Past Surgical History:  Procedure Laterality Date  . BREAST BIOPSY  5/12   fibrocystic changes  . CYSTOSCOPY N/A 03/09/2017   Procedure: CYSTOSCOPY;  Surgeon: Megan Salon, MD;  Location: [facility];  Service: Gynecology;  Laterality: N/A;  . DILATATION & CURETTAGE/HYSTEROSCOPY WITH MYOSURE N/A 12/26/2016   Procedure: DILATATION & CURETTAGE/HYSTEROSCOPY WITH MYOSURE;  Surgeon: Megan Salon, MD;  Location: [facility];  Service: Gynecology;  Laterality: N/A;  . DILATION AND CURETTAGE OF UTERUS     "years ago"  . FACIAL COSMETIC SURGERY    . HAND SURGERY Right 2012   fractured bone in hands  . LAPAROSCOPIC HYSTERECTOMY N/A 03/09/2017   Procedure: HYSTERECTOMY TOTAL LAPAROSCOPIC;  Surgeon: Megan Salon, MD;  Location[facility];  Service: Gynecology;  Laterality: N/A;  . LAPAROSCOPIC SALPINGO OOPHERECTOMY Bilateral 03/09/2017   Procedure: LAPAROSCOPIC SALPINGO OOPHORECTOMY;  Surgeon: Megan Salon, MD;  Location: [facility];  Service: Gynecology;  Laterality: Bilateral;  . TUBAL LIGATION  1983  . WRIST FRACTURE SURGERY Right 2008    Her Family  History Is Significant For: Family History  Problem Relation Age of Onset  . Osteoporosis Mother   . High blood pressure Mother   . High Cholesterol Mother   . Depression Mother   . Sudden death Father   . Alcohol abuse Father     Her Social History Is Significant For: Social History   Socioeconomic History  . Marital status: Married    Spouse name: Deidre Ala  . Number of children: 1  . Years of education: Not on file  . Highest education level: Not on file  Occupational History  . 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 Smoking status: Former Smoker    Packs/day: 0.50    Types: Cigarettes    Quit date: 08/04/1984    Years since quitting: 36.1  . Smokeless tobacco: Never Used  Vaping Use  . Vaping Use: Never used  Substance and Sexual Activity  . Alcohol use: Yes    Alcohol/week: 0.0 - 2.0 standard drinks  . Drug use: No  . Sexual activity: Not Currently    Partners: Male    Birth control/protection: Post-menopausal, Surgical    Comment: [place] 03/09/17   Other Topics Concern  . Not on file  Social History Narrative  . Not on file   Social Determinants of Health   Financial Resource Strain: Not on file  Food Insecurity: Not on file  Transportation Needs: Not on file  Physical Activity: Not on file  Stress: Not on file  Social Connections: Not on file    Her Allergies Are:  Allergies  Allergen Reactions  . Bee Venom Anaphylaxis, Swelling and Rash  . Penicillins Swelling, Rash and Other (See Comments)    Tongue swelling Has patient had a PCN reaction causing immediate rash, facial/tongue/throat swelling, SOB or lightheadedness with hypotension:Yes Has patient had a PCN reaction causing severe rash involving mucus membranes or skin necrosis:No Has patient had a PCN reaction that required hospitalization:Treated & released in ER--No Has patient had a PCN reaction occurring within the last 10 years:No If all of the above answers are "NO", then may proceed with  Cephalosporin use.   :   Her Current Medications Are:  Outpatient Encounter Medications as of 10/02/2020  Medication Sig  . acyclovir (ZOVIRAX) 800 MG tablet Take 800 mg by mouth daily.   . Calcium Carb-Cholecalciferol (CALCIUM + D3 PO) Take 1 tablet by mouth daily.  . Cholecalciferol (VITAMIN D) 2000 UNITS CAPS Take 2,000 Units by mouth daily.   . pantoprazole (PROTONIX) 40 MG tablet Take 1 tablet by mouth daily.  . rosuvastatin (CRESTOR) 20 MG tablet Take 20 mg by mouth daily.  . vitamin B-12 (CYANOCOBALAMIN) 1000 MCG tablet Take 1,000 mcg by mouth daily.  . vitamin E 1000 UNIT capsule Take 1,000 Units by mouth daily.  No facility-administered encounter medications on file as of 10/02/2020.  :  Review of Systems:  Out of a complete 14 point review of systems, all are reviewed and negative with the exception of these symptoms as listed below:  Review of Systems  Neurological:       Pt presents today with concerns of sleep apnea. Her husband has witnessed apnea events as well as told her she snores. She has never had a SS. States she avg 7 hrs of sleep but its never straight through sleep. Will wake up frequently during the night. States that she doesn't feel rested upon waking up.  Epworth Sleepiness Scale 0= would never doze 1= slight chance of dozing 2= moderate chance of dozing 3= high chance of dozing  Sitting and reading:3 Watching TV:1 Sitting inactive in a public place (ex. Theater or meeting):1 As a passenger in a car for an hour without a break:3 Lying down to rest in the afternoon:3 Sitting and talking to someone:0 Sitting quietly after lunch (no alcohol):1 In a car, while stopped in traffic:1 Total:13     Objective:  Neurological Exam  Physical Exam Physical Examination:   Vitals:   10/02/20 1323  BP: 137/83  Pulse: 79    General Examination: The patient is a very pleasant 65 y.o. female in no acute distress. She appears well-developed and well-nourished  and well groomed.   HEENT: Normocephalic, atraumatic, pupils are equal, round and reactive to light, extraocular tracking is good without limitation to gaze excursion or nystagmus noted. Hearing is grossly intact. Face is symmetric with normal facial animation. Speech is clear with no dysarthria noted. There is no hypophonia. There is no lip, neck/head, jaw or voice tremor. Neck is supple with full range of passive and active motion. There are no carotid bruits on auscultation. Oropharynx exam reveals: mild mouth dryness, good dental hygiene and mild airway crowding, due to somewhat wider tongue and wider uvula, Mallampati class II, tonsils absent. Tongue protrudes centrally and palate elevates symmetrically. Neck circumference of 15-1/2 inches. She has a minimal overbite. Nasal inspection reveals no significant inferior turbinate hypertrophy or septal deviation.  Chest: Clear to auscultation without wheezing, rhonchi or crackles noted.  Heart: S1+S2+0, regular and normal without murmurs, rubs or gallops noted.   Abdomen: Soft, non-tender and non-distended with normal bowel sounds appreciated on auscultation.  Extremities: There is no obvious edema, left foot in a boot.  Skin: Warm and dry without trophic changes noted.   Musculoskeletal: exam reveals no obvious joint deformities, tenderness or joint swelling.   Neurologically:  Mental status: The patient is awake, alert and oriented in all 4 spheres. Her immediate and remote memory, attention, language skills and fund of knowledge are appropriate. There is no evidence of aphasia, agnosia, apraxia or anomia. Speech is clear with normal prosody and enunciation. Thought process is linear. Mood is normal and affect is normal.  Cranial nerves II - XII are as described above under HEENT exam.  Motor exam: Normal bulk, strength and tone is noted. There is no tremor, fine motor skills and coordination: grossly intact.  Cerebellar testing: No dysmetria  or intention tremor. There is no truncal or gait ataxia.  Sensory exam: intact to light touch in the upper and lower extremities.   Assessment and Plan:   In summary, MIYANNA WIERSMA is a very pleasant 65 y.o.-year old female with an underlying medical history of reflux disease, hyperlipidemia, vitamin D deficiency, anxiety, joint pain, and mild obesity, whose history and  physical exam are concerning for obstructive sleep apnea (OSA). I had a long chat with the patient about my findings and the diagnosis of OSA, its prognosis and treatment options. We talked about medical treatments, surgical interventions and non-pharmacological approaches. I explained in particular the risks and ramifications of untreated moderate to severe OSA, especially with respect to developing cardiovascular disease down the Road, including congestive heart failure, difficult to treat hypertension, cardiac arrhythmias, or stroke. Even type 2 diabetes has, in part, been linked to untreated OSA. Symptoms of untreated OSA include daytime sleepiness, memory problems, mood irritability and mood disorder such as depression and anxiety, lack of energy, as well as recurrent headaches, especially morning headaches. We talked about trying to maintain a healthy lifestyle in general, as well as the importance of weight control. We also talked about the importance of good sleep hygiene. I recommended the following at this time: sleep study.  I explained the sleep test procedure to the patient and also outlined possible surgical and non-surgical treatment options of OSA, including the use of a custom-made dental device (which would require a referral to a specialist dentist or oral surgeon), upper airway surgical options, such as traditional UPPP or a novel less invasive surgical option in the form of Inspire hypoglossal nerve stimulation (which would involve a referral to an ENT surgeon). I also explained the CPAP treatment option to the patient,  who indicated that she would be willing to try CPAP if the need arises. I explained the importance of being compliant with PAP treatment, not only for insurance purposes but primarily to improve Her symptoms, and for the patient's long term health benefit, including to reduce Her cardiovascular risks. I answered all her questions today and the patient was in agreement. I plan to see her back after the sleep study is completed and encouraged her to call with any interim questions, concerns, problems or updates.   Thank you very much for allowing me to participate in the care of this nice patient. If I can be of any further assistance to you please do not hesitate to call me at [phone].  Sincerely,   Star Age, MD, PhD

## 2020-10-02 NOTE — Patient Instructions (Signed)

## 2020-10-02 NOTE — Progress Notes (Signed)
Chief Complaint:   OBESITY Meredith Shannon is here to discuss her progress with her obesity treatment plan along with follow-up of her obesity related diagnoses. Meredith Shannon is on the Category 1 Plan and states she is following her eating plan approximately 30% of the time. Meredith Shannon states she is doing 0 minutes 0 times per week.  Today's visit was #: 4 Starting weight: 215 lbs Starting date: 08/15/2020 Today's weight: 211 lbs Today's date: 10/01/2020 Total lbs lost to date: 4 lbs Total lbs lost since last in-office visit: 0  Interim History: Meredith Shannon's weight remains the same. She has been out of town for work.  Subjective:   1. Mixed hyperlipidemia Meredith Shannon is taking Crestor. She denies myalgias.  2. Vitamin D deficiency Meredith Shannon is taking OTC Vit D.   Ref. Range 08/15/2020 14:06  Vitamin D, 25-Hydroxy Latest Ref Range: 30.0 - 100.0 ng/mL 48.5   3. Elevated hemoglobin (HCC) and Hct Refined to hemoglobin and hematocrit. Hgb 16.3 and Hct 48.0. No action.   Ref. Range 08/15/2020 14:06  Hemoglobin Latest Ref Range: 11.1 - 15.9 g/dL 16.3 (H)  HCT Latest Ref Range: 34.0 - 46.6 % 48.0 (H)   Assessment/Plan:   1. Mixed hyperlipidemia Cardiovascular risk and specific lipid/LDL goals reviewed.  We discussed several lifestyle modifications today and Meredith Shannon will continue to work on diet, exercise and weight loss efforts. Orders and follow up as documented in patient record.   Counseling Intensive lifestyle modifications are the first line treatment for this issue. . Dietary changes: Increase soluble fiber. Decrease simple carbohydrates. . Exercise changes: Moderate to vigorous-intensity aerobic activity 150 minutes per week if tolerated. . Lipid-lowering medications: see documented in medical record.  2. Vitamin D deficiency Low Vitamin D level contributes to fatigue and are associated with obesity, breast, and colon cancer. She agrees to continue to take OTC Vitamin D daily and will follow-up for routine  testing of Vitamin D, at least 2-3 times per year to avoid over-replacement.  3. Elevated hemoglobin (HCC) and Hct Meredith Shannon will give blood and we will follow labs.  4. Class 1 obesity due to excess calories with serious comorbidity and body mass index (BMI) of 30.0 to 30.9 in adult Meredith Shannon is currently in the action stage of change. As such, her goal is to continue with weight loss efforts. She has agreed to the Category 1 Plan.   Meredith Shannon will be more adherent to the plan and work on meal planning.  Exercise goals: Meredith Shannon is in a boot, which limits her activity. she will see the orthopedist on 10/15/2020.  Behavioral modification strategies: increasing lean protein intake, decreasing simple carbohydrates, increasing vegetables, increasing water intake, decreasing eating out, no skipping meals, meal planning and cooking strategies, keeping healthy foods in the home and planning for success.  Meredith Shannon has agreed to follow-up with our clinic in 2 weeks. She was informed of the importance of frequent follow-up visits to maximize her success with intensive lifestyle modifications for her multiple health conditions.   Objective:   Blood pressure (!) 142/86, pulse 81, temperature 97.9 F (36.6 C), height 5\' 10"  (1.778 m), weight 211 lb (95.7 kg), last menstrual period 08/04/2006, SpO2 98 %. Body mass index is 30.28 kg/m.  General: Cooperative, alert, well developed, in no acute distress. HEENT: Conjunctivae and lids unremarkable. Cardiovascular: Regular rhythm.  Lungs: Normal work of breathing. Neurologic: No focal deficits.   Lab Results  Component Value Date   CREATININE 0.93 08/15/2020   BUN 20 08/15/2020  NA 142 08/15/2020   K 4.7 08/15/2020   CL 102 08/15/2020   CO2 22 08/15/2020   Lab Results  Component Value Date   ALT 19 08/15/2020   AST 20 08/15/2020   ALKPHOS 76 08/15/2020   BILITOT 0.3 08/15/2020   Lab Results  Component Value Date   HGBA1C 5.6 08/15/2020   Lab Results   Component Value Date   INSULIN 8.2 08/15/2020   Lab Results  Component Value Date   TSH 0.788 08/15/2020   Lab Results  Component Value Date   CHOL 255 (H) 08/15/2020   HDL 68 08/15/2020   LDLCALC 166 (H) 08/15/2020   TRIG 118 08/15/2020   CHOLHDL 3.5 03/02/2020   Lab Results  Component Value Date   WBC 6.3 08/15/2020   HGB 16.3 (H) 08/15/2020   HCT 48.0 (H) 08/15/2020   MCV 94 08/15/2020   PLT 188 08/15/2020   No results found for: IRON, TIBC, FERRITIN  Attestation Statements:   Reviewed by clinician on day of visit: allergies, medications, problem list, medical history, surgical history, family history, social history, and previous encounter notes.  Time spent on visit including pre-visit chart review and post-visit care and charting was 20 minutes.   Coral Ceo, am acting as Location manager for [employer], DO.  I have reviewed the above documentation for accuracy and completeness, and I agree with the above. Jearld Lesch, DO

## 2020-10-03 ENCOUNTER — Telehealth: Payer: Self-pay

## 2020-10-03 NOTE — Telephone Encounter (Signed)
LVM for pt to call me back to schedule sleep study  

## 2020-10-15 ENCOUNTER — Other Ambulatory Visit: Payer: Self-pay

## 2020-10-15 ENCOUNTER — Encounter: Payer: Self-pay | Admitting: Podiatry

## 2020-10-15 ENCOUNTER — Ambulatory Visit (INDEPENDENT_AMBULATORY_CARE_PROVIDER_SITE_OTHER): Payer: BC Managed Care – PPO | Admitting: Podiatry

## 2020-10-15 ENCOUNTER — Ambulatory Visit (INDEPENDENT_AMBULATORY_CARE_PROVIDER_SITE_OTHER): Payer: BC Managed Care – PPO

## 2020-10-15 DIAGNOSIS — R600 Localized edema: Secondary | ICD-10-CM | POA: Diagnosis not present

## 2020-10-15 DIAGNOSIS — M21619 Bunion of unspecified foot: Secondary | ICD-10-CM

## 2020-10-15 DIAGNOSIS — M21612 Bunion of left foot: Secondary | ICD-10-CM

## 2020-10-16 ENCOUNTER — Ambulatory Visit (INDEPENDENT_AMBULATORY_CARE_PROVIDER_SITE_OTHER): Payer: BC Managed Care – PPO | Admitting: Family Medicine

## 2020-10-16 ENCOUNTER — Encounter (INDEPENDENT_AMBULATORY_CARE_PROVIDER_SITE_OTHER): Payer: Self-pay | Admitting: Family Medicine

## 2020-10-16 ENCOUNTER — Other Ambulatory Visit: Payer: Self-pay

## 2020-10-16 ENCOUNTER — Other Ambulatory Visit (INDEPENDENT_AMBULATORY_CARE_PROVIDER_SITE_OTHER): Payer: Self-pay | Admitting: Family Medicine

## 2020-10-16 VITALS — BP 126/84 | HR 73 | Temp 97.8°F | Ht 70.0 in | Wt 209.0 lb

## 2020-10-16 DIAGNOSIS — Z9189 Other specified personal risk factors, not elsewhere classified: Secondary | ICD-10-CM | POA: Diagnosis not present

## 2020-10-16 DIAGNOSIS — Z6830 Body mass index (BMI) 30.0-30.9, adult: Secondary | ICD-10-CM | POA: Diagnosis not present

## 2020-10-16 DIAGNOSIS — R632 Polyphagia: Secondary | ICD-10-CM | POA: Diagnosis not present

## 2020-10-16 DIAGNOSIS — E6609 Other obesity due to excess calories: Secondary | ICD-10-CM

## 2020-10-16 DIAGNOSIS — E7849 Other hyperlipidemia: Secondary | ICD-10-CM | POA: Diagnosis not present

## 2020-10-16 MED ORDER — WEGOVY 0.25 MG/0.5ML ~~LOC~~ SOAJ: 0.2500 mg | SUBCUTANEOUS | 0 refills | Status: DC

## 2020-10-16 NOTE — Progress Notes (Signed)
The 10-year ASCVD risk score Mikey Bussing DC Brooke Bonito., et al., 2013) is: 4.6%   Values used to calculate the score:     Age: 65 years     Sex: Female     Is Non-Hispanic African American: No     Diabetic: No     Tobacco smoker: No     Systolic Blood Pressure: 619 mmHg     Is BP treated: No     HDL Cholesterol: 68 mg/dL     Total Cholesterol: 255 mg/dL

## 2020-10-17 ENCOUNTER — Encounter (INDEPENDENT_AMBULATORY_CARE_PROVIDER_SITE_OTHER): Payer: Self-pay

## 2020-10-17 NOTE — Telephone Encounter (Signed)
PA Denied Mychart message sent to pt.

## 2020-10-17 NOTE — Telephone Encounter (Signed)
FYI- I submitted a PA on this medication, awaiting determination.

## 2020-10-17 NOTE — Telephone Encounter (Signed)
PA-Submitted 10/17/2020

## 2020-10-17 NOTE — Telephone Encounter (Signed)
Last OV with Dawn 

## 2020-10-17 NOTE — Telephone Encounter (Signed)
Last seen by Dawn Whitmire, FNP 

## 2020-10-18 NOTE — Progress Notes (Signed)
Subjective:   Patient ID: Meredith Shannon, female   DOB: 65 y.o.   MRN: [id number]   HPI Patient states that overall she has been doing pretty well but over the last week she developed more swelling in her entire foot and into her ankle.  States that while its not severely tender it is hard for her to even think about shoes and it seemed like it started recently.  Overall the structural correction is doing well   ROS      Objective:  Physical Exam  Neurovascular status found to be intact negative Bevelyn Buckles' sign was noted with patient found to have edema extending from the forefoot into the midfoot and into the ankle left localized to this area with +2 pitting edema in the midfoot.  The incision site is healing well and I did not note any drainage or redness or indications that there is any pathology from that standpoint and she has increased her activity recently     Assessment:  Localized edema with no indication of DVT currently with swelling of the forefoot midfoot into the ankle left after surgery with possibility of increased activity creating this syndrome H&P     Plan:  The reviewed condition and x-ray and at this point applied a new boot with Ace wrap and elevation to try to control the edema.  I did explain if any swelling into the lower leg were to occur she should develop any shortness of breath or other issues that she should consider this in the medical emergency and go to the emergency room and contact us but it does not appear that there is any kind of pathology that way and I am hoping that compression will reduce the swelling mechanism she is experiencing  X-rays indicate the osteotomy itself is healing well no indications of movement with fixation in place good correction

## 2020-10-22 ENCOUNTER — Ambulatory Visit (INDEPENDENT_AMBULATORY_CARE_PROVIDER_SITE_OTHER): Payer: BC Managed Care – PPO | Admitting: Podiatry

## 2020-10-22 ENCOUNTER — Encounter: Payer: Self-pay | Admitting: Podiatry

## 2020-10-22 ENCOUNTER — Other Ambulatory Visit: Payer: Self-pay

## 2020-10-22 ENCOUNTER — Encounter (INDEPENDENT_AMBULATORY_CARE_PROVIDER_SITE_OTHER): Payer: Self-pay | Admitting: Family Medicine

## 2020-10-22 ENCOUNTER — Ambulatory Visit (INDEPENDENT_AMBULATORY_CARE_PROVIDER_SITE_OTHER): Payer: BC Managed Care – PPO

## 2020-10-22 DIAGNOSIS — R600 Localized edema: Secondary | ICD-10-CM

## 2020-10-22 DIAGNOSIS — M21612 Bunion of left foot: Secondary | ICD-10-CM

## 2020-10-22 DIAGNOSIS — M21619 Bunion of unspecified foot: Secondary | ICD-10-CM

## 2020-10-22 DIAGNOSIS — R632 Polyphagia: Secondary | ICD-10-CM | POA: Insufficient documentation

## 2020-10-22 NOTE — Progress Notes (Signed)
Chief Complaint:   OBESITY Meredith Shannon is here to discuss her progress with her obesity treatment plan along with follow-up of her obesity related diagnoses. Meredith Shannon is on the Category 1 Plan and states she is following her eating plan approximately 75% of the time. Meredith Shannon states she is doing 0 minutes 0 times per week.  Today's visit was #: 5 Starting weight: 215 lbs Starting date: 08/15/2020 Today's weight: 209 lbs Today's date: 10/16/2020 Total lbs lost to date: 6 Total lbs lost since last in-office visit: 2  Interim History: Meredith Shannon generally has a protein shake Meredith lunch. She has nuts Meredith breakfast. Her hunger is satisfied, but she struggles on the weekend with sticking to the plan. She tends to snack on too many carbs (cheese crackers, cookies, etc).  Subjective:   1. Other hyperlipidemia Meredith Shannon notes hunger after dinner and on the weekend as well as carb cravings. She has been successful in the past with phen-fen. She will consider Qysmia (out of pocket) if Mancel Parsons is not covered.   Lab Results  Component Value Date   ALT 19 08/15/2020   AST 20 08/15/2020   ALKPHOS 76 08/15/2020   BILITOT 0.3 08/15/2020   Lab Results  Component Value Date   CHOL 255 (H) 08/15/2020   HDL 68 08/15/2020   LDLCALC 166 (H) 08/15/2020   TRIG 118 08/15/2020   CHOLHDL 3.5 03/02/2020   2. Polyphagia Meredith Shannon's last LDL was not Meredith goal Meredith 166. Her HDL and triglycerides were within normal limits. She is on Crestor 20 mg. Her ASCVD risk score is 4.6%. Lab Results  Component Value Date   CHOL 255 (H) 08/15/2020   HDL 68 08/15/2020   LDLCALC 166 (H) 08/15/2020   TRIG 118 08/15/2020   CHOLHDL 3.5 03/02/2020     3. Meredith Shannon Meredith Shannon is Meredith risk for drug side effects due to starting [facility].  Assessment/Plan:   1. Other hyperlipidemia . Avree will continue Crestor, and will continue to work on diet, exercise and weight loss efforts.   2. Polyphagia  Meredith Shannon agreed to start  Wegovy 0.25 mg weekly with no refills. She has no history of cholethesis, pancreatitis, and family history of thyroid cancer. will continue to work on diet, exercise and weight loss efforts.  - Semaglutide-Weight Management (WEGOVY) 0.25 MG/0.5ML SOAJ; Inject 0.25 mg into the skin once a week.  Dispense: 2 mL; Refill: 0  3. Meredith Shannon Meredith Shannon was given approximately 15 minutes of drug side effect counseling today. We discussed side effect possibility and risk versus benefits. Meredith Shannon agreed to the Shannon and will contact this office if these side effects are intolerable.  Repetitive spaced learning was employed today to elicit superior memory formation and behavioral change.  4. Class 1 obesity due to excess calories with serious comorbidity and body mass index (BMI) of 30.0 to 30.9 in adult Meredith Shannon is currently in the action stage of change. As such, her goal is to continue with weight loss efforts. She has agreed to the Category 1 Plan or keeping a food journal and adhering to recommended goals of 1000-1100 calories and 75-80 grams of protein daily.   Takela may have a protein bar Meredith breakfast (atleast 20 grams of protein) or [place] Clap Delight for breakfast.  Exercise goals: No exercise has been prescribed Meredith this time.  Behavioral modification strategies: increasing lean protein intake and meal planning and cooking strategies.  Reona has agreed  to follow-up with our clinic in 2 weeks.  Objective:   Blood pressure 126/84, pulse 73, temperature 97.8 F (36.6 C), height 5\' 10"  (1.778 m), weight 209 lb (94.8 kg), last menstrual period 08/04/2006, SpO2 98 %. Body mass index is 29.99 kg/m.  General: Cooperative, alert, well developed, in no acute distress. HEENT: Conjunctivae and lids unremarkable. Cardiovascular: Regular rhythm.  Lungs: Normal work of breathing. Neurologic: No focal deficits.   Lab Results  Component Value Date   CREATININE 0.93 08/15/2020    BUN 20 08/15/2020   NA 142 08/15/2020   K 4.7 08/15/2020   CL 102 08/15/2020   CO2 22 08/15/2020   Lab Results  Component Value Date   ALT 19 08/15/2020   AST 20 08/15/2020   ALKPHOS 76 08/15/2020   BILITOT 0.3 08/15/2020   Lab Results  Component Value Date   HGBA1C 5.6 08/15/2020   Lab Results  Component Value Date   INSULIN 8.2 08/15/2020   Lab Results  Component Value Date   TSH 0.788 08/15/2020   Lab Results  Component Value Date   CHOL 255 (H) 08/15/2020   HDL 68 08/15/2020   LDLCALC 166 (H) 08/15/2020   TRIG 118 08/15/2020   CHOLHDL 3.5 03/02/2020   Lab Results  Component Value Date   WBC 6.3 08/15/2020   HGB 16.3 (H) 08/15/2020   HCT 48.0 (H) 08/15/2020   MCV 94 08/15/2020   PLT 188 08/15/2020   No results found for: IRON, TIBC, FERRITIN  Attestation Statements:   Reviewed by clinician on day of visit: allergies, medications, problem list, medical history, surgical history, family history, social history, and previous encounter notes.   Wilhemena Durie, am acting as Location manager for [employer], FNP-C.  I have reviewed the above documentation for accuracy and completeness, and I agree with the above. -  Georgianne Fick, FNP

## 2020-10-23 DIAGNOSIS — D2271 Melanocytic nevi of right lower limb, including hip: Secondary | ICD-10-CM | POA: Diagnosis not present

## 2020-10-23 DIAGNOSIS — D225 Melanocytic nevi of trunk: Secondary | ICD-10-CM | POA: Diagnosis not present

## 2020-10-23 DIAGNOSIS — D2261 Melanocytic nevi of right upper limb, including shoulder: Secondary | ICD-10-CM | POA: Diagnosis not present

## 2020-10-23 DIAGNOSIS — D2262 Melanocytic nevi of left upper limb, including shoulder: Secondary | ICD-10-CM | POA: Diagnosis not present

## 2020-10-26 NOTE — Progress Notes (Signed)
Subjective:   Patient ID: Meredith Shannon, female   DOB: 65 y.o.   MRN: [id number]   HPI Patient states she is doing fine with still mild swelling but overall continues to improve   ROS      Objective:  Physical Exam  Neurovascular status intact negative Bevelyn Buckles' sign noted foot healing well wound edges well coapted good alignment noted     Assessment:  Doing well post bunionectomy with wound edges well coapted good positional component noted     Plan:  H&P x-rays reviewed advised on continued compression elevation and immobilization is needed at reappoint to recheck as needed  X-rays indicate the osteotomy is healed well good alignment noted fixation in place

## 2020-11-06 ENCOUNTER — Ambulatory Visit (INDEPENDENT_AMBULATORY_CARE_PROVIDER_SITE_OTHER): Payer: BC Managed Care – PPO | Admitting: Family Medicine

## 2020-11-12 ENCOUNTER — Ambulatory Visit (INDEPENDENT_AMBULATORY_CARE_PROVIDER_SITE_OTHER): Payer: BC Managed Care – PPO | Admitting: Podiatry

## 2020-11-12 ENCOUNTER — Ambulatory Visit (INDEPENDENT_AMBULATORY_CARE_PROVIDER_SITE_OTHER): Payer: BC Managed Care – PPO

## 2020-11-12 ENCOUNTER — Other Ambulatory Visit: Payer: Self-pay

## 2020-11-12 DIAGNOSIS — M21612 Bunion of left foot: Secondary | ICD-10-CM

## 2020-11-12 DIAGNOSIS — M21619 Bunion of unspecified foot: Secondary | ICD-10-CM

## 2020-11-13 NOTE — Progress Notes (Signed)
Subjective:   Patient ID: Meredith Shannon, female   DOB: 65 y.o.   MRN: [id number]   HPI Patient states she continues to improve and is very satisfied with her correction of her foot   ROS      Objective:  Physical Exam  Neurovascular status intact negative Bevelyn Buckles' sign noted with left foot doing well healing with wound edges well coapted good alignment noted     Assessment:  Do well post osteotomy first metatarsal left     Plan:  Final x-rays reviewed allow patient to return to normal activity encouraged range of motion and wide shoe gear and gradual increase activities  X-rays indicate osteotomies healing well fixation in place good alignment noted

## 2020-12-05 ENCOUNTER — Ambulatory Visit (INDEPENDENT_AMBULATORY_CARE_PROVIDER_SITE_OTHER): Payer: BC Managed Care – PPO | Admitting: Neurology

## 2020-12-05 DIAGNOSIS — G4733 Obstructive sleep apnea (adult) (pediatric): Secondary | ICD-10-CM

## 2020-12-05 DIAGNOSIS — R0683 Snoring: Secondary | ICD-10-CM

## 2020-12-05 DIAGNOSIS — E669 Obesity, unspecified: Secondary | ICD-10-CM

## 2020-12-05 DIAGNOSIS — G4719 Other hypersomnia: Secondary | ICD-10-CM

## 2020-12-05 DIAGNOSIS — G478 Other sleep disorders: Secondary | ICD-10-CM

## 2020-12-05 DIAGNOSIS — R0681 Apnea, not elsewhere classified: Secondary | ICD-10-CM

## 2020-12-05 DIAGNOSIS — G479 Sleep disorder, unspecified: Secondary | ICD-10-CM

## 2020-12-05 DIAGNOSIS — R351 Nocturia: Secondary | ICD-10-CM

## 2020-12-06 NOTE — Progress Notes (Signed)
See procedure note.

## 2020-12-10 ENCOUNTER — Ambulatory Visit (INDEPENDENT_AMBULATORY_CARE_PROVIDER_SITE_OTHER): Payer: BC Managed Care – PPO | Admitting: Family Medicine

## 2020-12-10 ENCOUNTER — Encounter (INDEPENDENT_AMBULATORY_CARE_PROVIDER_SITE_OTHER): Payer: Self-pay | Admitting: Family Medicine

## 2020-12-10 NOTE — Procedures (Signed)
[facility] TEST (Watch PAT)  STUDY DATE: 12/05/20  DOB: [birth year]  MRN: [id number]  ORDERING CLINICIAN: Star Age, MD, PhD   REFERRING CLINICIAN: Dr. Owens Shark   CLINICAL INFORMATION/HISTORY: 65 year old woman with a history of reflux disease, hyperlipidemia, vitamin D deficiency, anxiety, joint pain, and mild obesity, who reports snoring and excessive daytime somnolence.   Epworth sleepiness score: 13/24.  BMI: 30.9 kg/m  Neck Circumference: 15.5 "  FINDINGS:   Total Record Time (hours, min): 9 H 5 min  Total Sleep Time (hours, min):  8 H 10 min   Percent REM (%):    24.98 %   Calculated pAHI (per hour): 15.6     REM pAHI: 40.7    NREM pAHI: 13.3 Supine AHI: N/A   Oxygen Saturation (%) Mean: 94  Minimum oxygen saturation (%):         86   O2 Saturation Range (%): 86-100  O2Saturation (minutes) <=88%: 0 min  Pulse Mean (bpm):    72  Pulse Range (45-97)   IMPRESSION: OSA (obstructive sleep apnea)  RECOMMENDATION:  This home sleep test demonstrates moderate obstructive sleep apnea with a total AHI of 15.6/hour and O2 nadir of 86%. Treatment with positive airway pressure is recommended. The patient will be advised to proceed with an autoPAP titration/trial at home for now. A full night titration study may be considered to optimize treatment settings, if needed down the road. Alternative treatment options may include Inspire (implantable tongue nerve stimulator), weight loss with avoidance of the supine sleep position, and a dental option with an oral appliance (through a dentist or orthodontist). Please note that untreated obstructive sleep apnea may carry additional perioperative morbidity. Patients with significant obstructive sleep apnea should receive perioperative PAP therapy and the surgeons and particularly the anesthesiologist should be informed of the diagnosis and the severity of the sleep disordered breathing. The patient should be  cautioned not to drive, work at heights, or operate dangerous or heavy equipment when tired or sleepy. Review and reiteration of good sleep hygiene measures should be pursued with any patient. Other causes of the patient's symptoms, including circadian rhythm disturbances, an underlying mood disorder, medication effect and/or an underlying medical problem cannot be ruled out based on this test. Clinical correlation is recommended. The patient and her referring provider will be notified of the test results. The patient will be seen in follow up in sleep clinic at [facility].  I certify that I have reviewed the raw data recording prior to the issuance of this report in accordance with the standards of the American Academy of Sleep Medicine (AASM).   INTERPRETING PHYSICIAN:    Star Age, MD, PhD  Board Certified in Neurology and Sleep Medicine  [facility] [address] [place], [place] [address] [phone]   Sleep Summary  Oxygen Saturation Statistics   Start Study Time: End Study Time: Total Recording Time:  9:05:47 PM 6:11:38 AM 9 h, 5 min  Total Sleep Time % REM of Sleep Time:  8 h, 10 min  25.0    Mean: 94 Minimum: 86 Maximum: 100  Mean of Desaturations Nadirs (%):   92  Oxygen Desatur. %:  4-9 10-20 >20 Total  Events Number Total   46  2 95.8 4.2  0 0.0  48 100.0  Oxygen Saturation: <90 <=88 <85 <80 <70  Duration (minutes): Sleep % 0.1 0.0 0.1 0.0 0.0 0.0 0.0 0.0 0.0 0.0  Respiratory Indices      Total Events REM NREM All Night  pRDI: pAHI 3%:  98  90 46.8 40.7 14.3 13.3 17.0 15.6  ODI 4%: pAHI 4%:  48 51 32.5 6.1 8.3 8.9       Pulse Rate Statistics during Sleep (BPM)      Mean: 72 Minimum: 45 Maximum: 97

## 2020-12-10 NOTE — Addendum Note (Signed)
Addended by: Star Age on: 12/10/2020 12:31 PM   Modules accepted: Orders

## 2020-12-10 NOTE — Progress Notes (Signed)
Patient referred by Dr. Owens Shark, seen by me on 10/02/20, patient had a HST on 12/05/20.    Please call and notify the patient that the recent home sleep test showed obstructive sleep apnea in the moderate range. I recommend treatment in the form of autoPAP, which means, that we don't have to bring her in for a sleep study with CPAP, but will let her start using a so called autoPAP machine at home, which is a CPAP-like machine with self-adjusting pressures. We will send the order to a local DME company (of her choice, or as per insurance requirement). The DME representative will fit her with a mask, educate her on how to use the machine, how to put the mask on, etc. I have placed an order in the chart. Please send the order, talk to patient, send report to referring MD. We will need a FU in sleep clinic for 10 weeks post-PAP set up, please arrange that with me or one of our NPs. Also reinforce the need for compliance with treatment. Thanks,   Star Age, MD, PhD [facility])

## 2020-12-11 ENCOUNTER — Encounter (INDEPENDENT_AMBULATORY_CARE_PROVIDER_SITE_OTHER): Payer: Self-pay | Admitting: Bariatrics

## 2020-12-11 ENCOUNTER — Telehealth: Payer: Self-pay

## 2020-12-11 DIAGNOSIS — G4733 Obstructive sleep apnea (adult) (pediatric): Secondary | ICD-10-CM | POA: Insufficient documentation

## 2020-12-11 NOTE — Telephone Encounter (Signed)
I called pt. I advised pt that Dr. Rexene Alberts reviewed their sleep study results and found that pt has moderate osa. Dr. Rexene Alberts recommends that pt start autopap for treatment. I reviewed PAP compliance expectations with the pt. Pt is agreeable to starting an auto-PAP. I advised pt that an order will be sent to a DME, [facility], and [facility] will call the pt within about one week after they file with the pt's insurance. [facility] will show the pt how to use the machine, fit for masks, and troubleshoot the auto-PAP if needed. Pt is aware of the shortage of APAP machines and that start dates can be 6-12 weeks out. She will call once she has start date to schedule f/u.  A letter with all of this information in it will be mailed to the pt as a reminder. I verified with the pt that the address we have on file is correct. Pt verbalized understanding of results. Pt had no questions at this time but was encouraged to call back if questions arise. I have sent the order to [employer] and have received confirmation that they have received the order.

## 2020-12-11 NOTE — Telephone Encounter (Signed)
-----   Message from Star Age, MD sent at 12/10/2020 12:30 PM EDT ----- Patient referred by Dr. Owens Shark, seen by me on 10/02/20, patient had a HST on 12/05/20.    Please call and notify the patient that the recent home sleep test showed obstructive sleep apnea in the moderate range. I recommend treatment in the form of autoPAP, which means, that we don't have to bring her in for a sleep study with CPAP, but will let her start using a so called autoPAP machine at home, which is a CPAP-like machine with self-adjusting pressures. We will send the order to a local DME company (of her choice, or as per insurance requirement). The DME representative will fit her with a mask, educate her on how to use the machine, how to put the mask on, etc. I have placed an order in the chart. Please send the order, talk to patient, send report to referring MD. We will need a FU in sleep clinic for 10 weeks post-PAP set up, please arrange that with me or one of our NPs. Also reinforce the need for compliance with treatment. Thanks,   Star Age, MD, PhD [facility])

## 2020-12-18 ENCOUNTER — Other Ambulatory Visit (HOSPITAL_BASED_OUTPATIENT_CLINIC_OR_DEPARTMENT_OTHER): Payer: Self-pay | Admitting: Obstetrics & Gynecology

## 2020-12-18 DIAGNOSIS — Z1231 Encounter for screening mammogram for malignant neoplasm of breast: Secondary | ICD-10-CM

## 2020-12-24 ENCOUNTER — Ambulatory Visit (HOSPITAL_BASED_OUTPATIENT_CLINIC_OR_DEPARTMENT_OTHER)
Admission: RE | Admit: 2020-12-24 | Discharge: 2020-12-24 | Disposition: A | Discharge status: Home / Self Care | Payer: BC Managed Care – PPO | Source: Ambulatory Visit | Attending: Obstetrics & Gynecology | Admitting: Obstetrics & Gynecology

## 2020-12-24 ENCOUNTER — Other Ambulatory Visit: Payer: Self-pay

## 2020-12-24 DIAGNOSIS — Z1231 Encounter for screening mammogram for malignant neoplasm of breast: Secondary | ICD-10-CM | POA: Diagnosis not present

## 2021-02-25 DIAGNOSIS — R0989 Other specified symptoms and signs involving the circulatory and respiratory systems: Secondary | ICD-10-CM | POA: Diagnosis not present

## 2021-02-25 DIAGNOSIS — Z23 Encounter for immunization: Secondary | ICD-10-CM | POA: Diagnosis not present

## 2021-02-25 DIAGNOSIS — E7800 Pure hypercholesterolemia, unspecified: Secondary | ICD-10-CM | POA: Diagnosis not present

## 2021-02-25 DIAGNOSIS — K219 Gastro-esophageal reflux disease without esophagitis: Secondary | ICD-10-CM | POA: Diagnosis not present

## 2021-03-18 DIAGNOSIS — G4733 Obstructive sleep apnea (adult) (pediatric): Secondary | ICD-10-CM | POA: Diagnosis not present

## 2021-04-05 ENCOUNTER — Ambulatory Visit (HOSPITAL_BASED_OUTPATIENT_CLINIC_OR_DEPARTMENT_OTHER): Payer: BC Managed Care – PPO | Admitting: Obstetrics & Gynecology

## 2021-04-09 ENCOUNTER — Encounter: Payer: Self-pay | Admitting: Orthopedic Surgery

## 2021-04-09 ENCOUNTER — Other Ambulatory Visit: Payer: Self-pay

## 2021-04-09 ENCOUNTER — Ambulatory Visit: Payer: BC Managed Care – PPO | Admitting: Orthopedic Surgery

## 2021-04-09 ENCOUNTER — Encounter (HOSPITAL_BASED_OUTPATIENT_CLINIC_OR_DEPARTMENT_OTHER): Payer: Self-pay | Admitting: Orthopedic Surgery

## 2021-04-09 ENCOUNTER — Ambulatory Visit: Payer: Self-pay

## 2021-04-09 VITALS — BP 120/74 | HR 87 | Ht 70.0 in | Wt 214.8 lb

## 2021-04-09 DIAGNOSIS — S52502A Unspecified fracture of the lower end of left radius, initial encounter for closed fracture: Secondary | ICD-10-CM | POA: Insufficient documentation

## 2021-04-09 DIAGNOSIS — M25532 Pain in left wrist: Secondary | ICD-10-CM | POA: Diagnosis not present

## 2021-04-09 DIAGNOSIS — S52572A Other intraarticular fracture of lower end of left radius, initial encounter for closed fracture: Secondary | ICD-10-CM

## 2021-04-09 MED ORDER — TRAMADOL HCL 50 MG PO TABS: 50.0000 mg | ORAL_TABLET | Freq: Four times a day (QID) | ORAL | 0 refills | Status: DC | PRN

## 2021-04-09 NOTE — Progress Notes (Deleted)
  The note originally documented on this encounter has been moved the the encounter in which it belongs.  

## 2021-04-09 NOTE — Progress Notes (Signed)
Office Visit Note   Patient: Meredith Shannon           Date of Birth: [birth year]           MRN: [id number] Visit Date: 04/09/2021              Requested by: Aletha Halim., PA-C [address]   [address] PCP: Aletha Halim., PA-C   Assessment & Plan: Visit Diagnoses:  1. Pain in left wrist   2. Other closed intra-articular fracture of distal end of left radius, initial encounter     Plan: We discussed the diagnosis, prognosis, and both conservative and operative treatment options for her left distal radius fracture.  After our discussion, the patient has elected to proceed with open reduction and internal fixation.  We reviewed the benefits of surgery and the potential risks including, but not limited to, persistent symptoms, infection, damage to nearby nerves and blood vessels, delayed wound healing, nonunion, malunion.    All patient concerns and questions were addressed.  We will provide her with a new splint as her current splint is tight and uncomfortable. We will also prescribe some tramadol for night pain.  We will try to get her on the schedule as soon as possible.     Follow-Up Instructions: No follow-ups on file.   Orders:  Orders Placed This Encounter  Procedures   XR Wrist Complete Left   Meds ordered this encounter  Medications   traMADol (ULTRAM) 50 MG tablet    Sig: Take 1 tablet (50 mg total) by mouth every 6 (six) hours as needed for up to 3 days for moderate pain.    Dispense:  12 tablet    Refill:  0      Procedures: No procedures performed   Clinical Data: No additional findings.   Subjective: Chief Complaint  Patient presents with   Left Wrist - New Patient (Initial Visit)    This is a 65 year old right-hand-dominant female who presents with a closed, left distal radius fracture.  She had a ground-level fall 1 week ago when she slipped [place] wet [place] on a cruise [place].  She was placed in a volar splint on the  ship.  She describes pain at the radial and ulnar aspects of the wrist that is dull and throbbing in nature and worse at night.  She is taking ibuprofen which helps some but has not helped with the nighttime pain.  She has some mild global paresthesias in her fingertips.  She has some mild ecchymosis at the medial aspect of left elbow but denies any pain with elbow range of motion.  She had a previous right distal radius fracture treated with ORIF around 2008.   Review of Systems  Constitutional: Negative.   Respiratory: Negative.    Cardiovascular: Negative.   Musculoskeletal:  Positive for joint swelling.       Left wrist pain  Skin:        Medial left elbow ecchymosis  Neurological: Negative.   Hematological: Negative.     Objective: Vital Signs: BP 120/74 (BP Location: Right Arm, Patient Position: Sitting, Cuff Size: Normal)   Pulse 87   Ht '5\' 10"'$  (1.778 m)   Wt 214 lb 12.8 oz (97.4 kg)   LMP 08/04/2006   SpO2 94%   BMI 30.82 kg/m   Physical Exam Constitutional:      Appearance: Normal appearance.  Cardiovascular:     Rate and Rhythm: Normal rate.  Pulses: Normal pulses.  Pulmonary:     Effort: Pulmonary effort is normal.  Skin:    General: Skin is warm and dry.     Findings: Bruising present.  Neurological:     General: No focal deficit present.     Mental Status: She is alert. Mental status is at baseline.  Psychiatric:        Behavior: Behavior normal.    Left Hand Exam   Tenderness  Left hand tenderness location: TTP at wrist w/ moderate swelling.   Muscle Strength  Left wrist normal muscle strength: Wrist strength/ROM limited by pain and swelling.  Other  Left hand sensation: Mildly diminished. Pulse: present  Comments:  Full and painless ROM of the elbow and shoulder.  Finger ROM limited by bulky splint.      Specialty Comments:  No specialty comments available.  Imaging: 3 views of the left wrist taken today are reviewed and interpreted by  me.  They demonstrate and intra-articular distal radius fracture with a large radial styloid fragment.  There is dorsal angulation and shortening.    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 Ellyn Hack.  Has mid LAD stenosis.   Chest pain    Complication of anesthesia    in recover room once when waking up slow to exhale per patient    Constipation    GERD (gastroesophageal reflux disease)    occasional uses tums   High cholesterol    In total cholesterol and triglycerides.   High triglycerides    HSV-1 infection    Joint pain    Lichen simplex chronicus    Sleep apnea    Swallowing difficulty    Vitamin D deficiency     Family History  Problem Relation Age of Onset   Osteoporosis Mother    High blood pressure Mother    High Cholesterol Mother    Depression Mother    Sudden death Father    Alcohol abuse Father     Past Surgical History:  Procedure Laterality Date   BREAST BIOPSY  5/12   fibrocystic changes   CYSTOSCOPY N/A 03/09/2017   Procedure: CYSTOSCOPY;  Surgeon: Megan Salon, MD;  Location: [facility];  Service: Gynecology;  Laterality: N/A;   DILATATION & CURETTAGE/HYSTEROSCOPY WITH MYOSURE N/A 12/26/2016   Procedure:  Carroll;  Surgeon: Megan Salon, MD;  Location: [facility];  Service: Gynecology;  Laterality: N/A;   DILATION AND CURETTAGE OF UTERUS     "years ago"   FACIAL COSMETIC SURGERY     HAND SURGERY Right 2012   fractured bone in hands   LAPAROSCOPIC HYSTERECTOMY N/A 03/09/2017   Procedure: HYSTERECTOMY TOTAL LAPAROSCOPIC;  Surgeon: Megan Salon, MD;  Location: [facility];  Service: Gynecology;  Laterality: N/A;   LAPAROSCOPIC SALPINGO OOPHERECTOMY Bilateral 03/09/2017   Procedure: LAPAROSCOPIC SALPINGO OOPHORECTOMY;  Surgeon: Megan Salon, MD;  Location: [facility];  Service: Gynecology;  Laterality: Bilateral;   TUBAL LIGATION  1983   WRIST FRACTURE SURGERY Right 2008   Social History   Occupational History   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

## 2021-04-11 ENCOUNTER — Encounter (HOSPITAL_BASED_OUTPATIENT_CLINIC_OR_DEPARTMENT_OTHER): Payer: Self-pay | Admitting: Obstetrics & Gynecology

## 2021-04-11 ENCOUNTER — Other Ambulatory Visit: Payer: Self-pay

## 2021-04-11 ENCOUNTER — Ambulatory Visit (INDEPENDENT_AMBULATORY_CARE_PROVIDER_SITE_OTHER): Payer: BC Managed Care – PPO | Admitting: Obstetrics & Gynecology

## 2021-04-11 VITALS — BP 153/78 | HR 75 | Ht 70.0 in | Wt 215.4 lb

## 2021-04-11 DIAGNOSIS — N952 Postmenopausal atrophic vaginitis: Secondary | ICD-10-CM | POA: Diagnosis not present

## 2021-04-11 DIAGNOSIS — Z780 Asymptomatic menopausal state: Secondary | ICD-10-CM

## 2021-04-11 DIAGNOSIS — Z01419 Encounter for gynecological examination (general) (routine) without abnormal findings: Secondary | ICD-10-CM

## 2021-04-11 DIAGNOSIS — Z90710 Acquired absence of both cervix and uterus: Secondary | ICD-10-CM | POA: Diagnosis not present

## 2021-04-11 DIAGNOSIS — L280 Lichen simplex chronicus: Secondary | ICD-10-CM

## 2021-04-11 NOTE — Progress Notes (Signed)
65 y.o. G39P1011 Married White or Caucasian female here for annual exam.  Broke left wrist with a fall.  Having surgery tomorrow.  Was on the first day of a cruise when this happened.  Ended up seeing a physician on the ship and arm was wrapped and put in a cast.  She tried to "make the best of it".    Denies vaginal bleeding.  Patient's last menstrual period was 08/04/2006.          Sexually active: No.  The current method of family planning is post menopausal status.    Exercising: No.   Smoker:  no  Health Maintenance: Pap:  06/03/2016 History of abnormal Pap:  no MMG:  12/24/2020 Negative Colonoscopy:  2019, Dr. Benson Norway.  Follow up 3 years. BMD:   09/16/2018 TDaP:  2014 Shingrix:   started this summer Hep C testing: 10/31/201 Screening Labs: 02/2021   reports that she quit smoking about 36 years ago. Her smoking use included cigarettes. She smoked an average of .5 packs per day. She has never used smokeless tobacco. She reports current alcohol use. She reports that she does not use drugs.  Past Medical History:  Diagnosis Date   Anxiety    Broken wrist 2008   Cancer [facility])    basal cell   Chest pain 2018   seen by Dr. Ellyn Hack.  Has mid LAD stenosis.   Chest pain    Complication of anesthesia    in recover room once when waking up slow to exhale per patient    Constipation    GERD (gastroesophageal reflux disease)    occasional uses tums   High cholesterol    In total cholesterol and triglycerides.   High triglycerides    HSV-1 infection    Joint pain    Lichen simplex chronicus    Sleep apnea    Swallowing difficulty    Vitamin D deficiency     Past Surgical History:  Procedure Laterality Date   BREAST BIOPSY  5/12   fibrocystic changes   CYSTOSCOPY N/A 03/09/2017   Procedure: CYSTOSCOPY;  Surgeon: Megan Salon, MD;  Location: [facility];  Service: Gynecology;  Laterality: N/A;   DILATATION & CURETTAGE/HYSTEROSCOPY WITH MYOSURE N/A 12/26/2016    Procedure: [place];  Surgeon: Megan Salon, MD;  Location: [facility];  Service: Gynecology;  Laterality: N/A;   DILATION AND CURETTAGE OF UTERUS     "years ago"   FACIAL COSMETIC SURGERY     HAND SURGERY Right 2012   fractured bone in hands   LAPAROSCOPIC HYSTERECTOMY N/A 03/09/2017   Procedure: HYSTERECTOMY TOTAL LAPAROSCOPIC;  Surgeon: Megan Salon, MD;  Location: [facility];  Service: Gynecology;  Laterality: N/A;   LAPAROSCOPIC SALPINGO OOPHERECTOMY Bilateral 03/09/2017   Procedure: LAPAROSCOPIC SALPINGO OOPHORECTOMY;  Surgeon: Megan Salon, MD;  Location: [facility];  Service: Gynecology;  Laterality: Bilateral;   TUBAL LIGATION  1983   WRIST FRACTURE SURGERY Right 2008    Current Outpatient Medications  Medication Sig Dispense Refill   acyclovir (ZOVIRAX) 800 MG tablet Take 800 mg by mouth daily.      Calcium Carb-Cholecalciferol (CALCIUM + D3 PO) Take 1 tablet by mouth daily.     Cholecalciferol (VITAMIN D) 2000 UNITS CAPS Take 2,000 Units by mouth daily.      pantoprazole (PROTONIX) 40 MG tablet Take 1 tablet by mouth daily.     rosuvastatin (CRESTOR) 20 MG tablet Take  20 mg by mouth daily.     traMADol (ULTRAM) 50 MG tablet Take 1 tablet (50 mg total) by mouth every 6 (six) hours as needed for up to 3 days for moderate pain. 12 tablet 0   vitamin B-12 (CYANOCOBALAMIN) 1000 MCG tablet Take 1,000 mcg by mouth daily.     vitamin E 1000 UNIT capsule Take 1,000 Units by mouth daily.     No current facility-administered medications for this visit.    Family History  Problem Relation Age of Onset   Osteoporosis Mother    High blood pressure Mother    High Cholesterol Mother    Depression Mother    Sudden death Father    Alcohol abuse Father     Review of Systems  All other systems reviewed and are negative.  Exam:   BP (!) 153/78 (BP Location: Right Arm, Patient Position: Sitting, Cuff Size: Large)    Pulse 75   Ht '5\' 10"'$  (1.778 m) Comment: reported  Wt 215 lb 6.4 oz (97.7 kg)   LMP 08/04/2006   BMI 30.91 kg/m   Height: '5\' 10"'$  (177.8 cm) (reported)  General appearance: alert, cooperative and appears stated age Head: Normocephalic, without obvious abnormality, atraumatic Neck: no adenopathy, supple, symmetrical, trachea midline and thyroid normal to inspection and palpation Lungs: clear to auscultation bilaterally Breasts: normal appearance, no masses or tenderness Heart: regular rate and rhythm Abdomen: soft, non-tender; bowel sounds normal; no masses,  no organomegaly Extremities: extremities normal, atraumatic, no cyanosis or edema Skin: Skin color, texture, turgor normal. No rashes or lesions Lymph nodes: Cervical, supraclavicular, and axillary nodes normal. No abnormal inguinal nodes palpated Neurologic: Grossly normal  Pelvic: External genitalia:  no lesions              Urethra:  normal appearing urethra with no masses, tenderness or lesions              Bartholins and Skenes: normal                 Vagina: normal appearing vagina with normal color and no discharge, no lesions              Cervix: absent              Pap taken: No. Bimanual Exam:  Uterus:  uterus absent              Adnexa: no mass, fullness, tenderness               Rectovaginal: Confirms               Anus:  normal sphincter tone, no lesions  Chaperone, Octaviano Batty, CMA, was present for exam.  Assessment/Plan: 1. Well woman exam with routine gynecological exam - Pap 05/2016.  Not indicated. - MMG 12/24/2020 - colonoscopy 2019, follow up 3 years.  D/w pt.  She's going to try and get this done after her upcoming surgery - BMD 09/2018 - Vaccines updated/reviewed - lab work done 02/2021  2. H/O: hysterectomy - due to complex endometrial hyperplasia  3. Postmenopausal - no HRT  4. Vaginal atrophy  5. Lichen simplex chronicus - biopsy proven 2008, no visible changes

## 2021-04-12 ENCOUNTER — Ambulatory Visit (HOSPITAL_BASED_OUTPATIENT_CLINIC_OR_DEPARTMENT_OTHER): Payer: BC Managed Care – PPO | Admitting: Anesthesiology

## 2021-04-12 ENCOUNTER — Ambulatory Visit (HOSPITAL_BASED_OUTPATIENT_CLINIC_OR_DEPARTMENT_OTHER)
Admission: RE | Admit: 2021-04-12 | Discharge: 2021-04-12 | Disposition: A | Discharge status: Home / Self Care | Payer: BC Managed Care – PPO | Source: Ambulatory Visit | Attending: Orthopedic Surgery | Admitting: Orthopedic Surgery

## 2021-04-12 ENCOUNTER — Ambulatory Visit: Payer: Self-pay | Admitting: Orthopedic Surgery

## 2021-04-12 ENCOUNTER — Encounter (HOSPITAL_BASED_OUTPATIENT_CLINIC_OR_DEPARTMENT_OTHER): Payer: Self-pay | Admitting: Orthopedic Surgery

## 2021-04-12 ENCOUNTER — Other Ambulatory Visit: Payer: Self-pay

## 2021-04-12 ENCOUNTER — Encounter (HOSPITAL_BASED_OUTPATIENT_CLINIC_OR_DEPARTMENT_OTHER)
Admission: RE | Disposition: A | Discharge status: Home / Self Care | Payer: Self-pay | Source: Ambulatory Visit | Attending: Orthopedic Surgery

## 2021-04-12 DIAGNOSIS — S52572A Other intraarticular fracture of lower end of left radius, initial encounter for closed fracture: Secondary | ICD-10-CM | POA: Insufficient documentation

## 2021-04-12 DIAGNOSIS — E785 Hyperlipidemia, unspecified: Secondary | ICD-10-CM | POA: Diagnosis not present

## 2021-04-12 DIAGNOSIS — K219 Gastro-esophageal reflux disease without esophagitis: Secondary | ICD-10-CM | POA: Insufficient documentation

## 2021-04-12 DIAGNOSIS — S52572D Other intraarticular fracture of lower end of left radius, subsequent encounter for closed fracture with routine healing: Secondary | ICD-10-CM | POA: Diagnosis not present

## 2021-04-12 DIAGNOSIS — E559 Vitamin D deficiency, unspecified: Secondary | ICD-10-CM | POA: Diagnosis not present

## 2021-04-12 DIAGNOSIS — I2510 Atherosclerotic heart disease of native coronary artery without angina pectoris: Secondary | ICD-10-CM | POA: Insufficient documentation

## 2021-04-12 DIAGNOSIS — W010XXA Fall on same level from slipping, tripping and stumbling without subsequent striking against object, initial encounter: Secondary | ICD-10-CM | POA: Insufficient documentation

## 2021-04-12 DIAGNOSIS — Z87891 Personal history of nicotine dependence: Secondary | ICD-10-CM | POA: Insufficient documentation

## 2021-04-12 DIAGNOSIS — E7800 Pure hypercholesterolemia, unspecified: Secondary | ICD-10-CM | POA: Diagnosis not present

## 2021-04-12 DIAGNOSIS — Y92814 Boat as the place of occurrence of the external cause: Secondary | ICD-10-CM | POA: Insufficient documentation

## 2021-04-12 HISTORY — PX: OPEN REDUCTION INTERNAL FIXATION (ORIF) DISTAL RADIAL FRACTURE: SHX5989

## 2021-04-12 SURGERY — OPEN REDUCTION INTERNAL FIXATION (ORIF) DISTAL RADIUS FRACTURE
Anesthesia: Monitor Anesthesia Care | Site: Wrist | Laterality: Left

## 2021-04-12 MED ORDER — OXYCODONE HCL 5 MG PO TABS
5.0000 mg | ORAL_TABLET | Freq: Once | ORAL | Status: AC
  Administered 2021-04-12: 5 mg via ORAL

## 2021-04-12 MED ORDER — CLINDAMYCIN PHOSPHATE 900 MG/50ML IV SOLN
INTRAVENOUS | Status: AC
  Filled 2021-04-12: qty 50

## 2021-04-12 MED ORDER — PROPOFOL 500 MG/50ML IV EMUL
INTRAVENOUS | Status: DC | PRN
  Administered 2021-04-12: 75 ug/kg/min via INTRAVENOUS

## 2021-04-12 MED ORDER — DEXMEDETOMIDINE (PRECEDEX) IN NS 20 MCG/5ML (4 MCG/ML) IV SYRINGE
PREFILLED_SYRINGE | INTRAVENOUS | Status: DC | PRN
  Administered 2021-04-12 (×2): 4 ug via INTRAVENOUS
  Administered 2021-04-12: 8 ug via INTRAVENOUS
  Administered 2021-04-12: 4 ug via INTRAVENOUS

## 2021-04-12 MED ORDER — FENTANYL CITRATE (PF) 100 MCG/2ML IJ SOLN
INTRAMUSCULAR | Status: AC
  Filled 2021-04-12: qty 2

## 2021-04-12 MED ORDER — ONDANSETRON HCL 4 MG/2ML IJ SOLN
INTRAMUSCULAR | Status: DC | PRN
  Administered 2021-04-12: 4 mg via INTRAVENOUS

## 2021-04-12 MED ORDER — LIDOCAINE HCL (PF) 1 % IJ SOLN
INTRAMUSCULAR | Status: AC
  Filled 2021-04-12: qty 5

## 2021-04-12 MED ORDER — FENTANYL CITRATE (PF) 100 MCG/2ML IJ SOLN
50.0000 ug | Freq: Once | INTRAMUSCULAR | Status: AC
  Administered 2021-04-12: 50 ug via INTRAVENOUS

## 2021-04-12 MED ORDER — OXYCODONE HCL 5 MG PO TABS
5.0000 mg | ORAL_TABLET | ORAL | 0 refills | Status: AC | PRN
Start: 2021-04-12 — End: 2021-04-19

## 2021-04-12 MED ORDER — PROPOFOL 10 MG/ML IV BOLUS
INTRAVENOUS | Status: AC
  Filled 2021-04-12: qty 20

## 2021-04-12 MED ORDER — PROPOFOL 10 MG/ML IV BOLUS
INTRAVENOUS | Status: DC | PRN
  Administered 2021-04-12: 30 mg via INTRAVENOUS
  Administered 2021-04-12 (×2): 20 mg via INTRAVENOUS

## 2021-04-12 MED ORDER — LACTATED RINGERS IV SOLN: INTRAVENOUS | Status: DC

## 2021-04-12 MED ORDER — MIDAZOLAM HCL 2 MG/2ML IJ SOLN
INTRAMUSCULAR | Status: AC
  Filled 2021-04-12: qty 2

## 2021-04-12 MED ORDER — ACETAMINOPHEN 500 MG PO TABS
ORAL_TABLET | ORAL | Status: AC
  Filled 2021-04-12: qty 2

## 2021-04-12 MED ORDER — ROPIVACAINE HCL 5 MG/ML IJ SOLN
INTRAMUSCULAR | Status: DC | PRN
  Administered 2021-04-12: 20 mL via PERINEURAL

## 2021-04-12 MED ORDER — CLINDAMYCIN PHOSPHATE 900 MG/50ML IV SOLN
900.0000 mg | INTRAVENOUS | Status: AC
  Administered 2021-04-12: 900 mg via INTRAVENOUS

## 2021-04-12 MED ORDER — MIDAZOLAM HCL 5 MG/5ML IJ SOLN
INTRAMUSCULAR | Status: DC | PRN
  Administered 2021-04-12: 2 mg via INTRAVENOUS

## 2021-04-12 MED ORDER — MIDAZOLAM HCL 2 MG/2ML IJ SOLN
2.0000 mg | Freq: Once | INTRAMUSCULAR | Status: AC
  Administered 2021-04-12: 2 mg via INTRAVENOUS

## 2021-04-12 MED ORDER — 0.9 % SODIUM CHLORIDE (POUR BTL) OPTIME
TOPICAL | Status: DC | PRN
  Administered 2021-04-12: 200 mL

## 2021-04-12 MED ORDER — ACETAMINOPHEN 500 MG PO TABS
1000.0000 mg | ORAL_TABLET | Freq: Once | ORAL | Status: AC
  Administered 2021-04-12: 1000 mg via ORAL

## 2021-04-12 MED ORDER — DEXAMETHASONE SODIUM PHOSPHATE 10 MG/ML IJ SOLN
INTRAMUSCULAR | Status: DC | PRN
  Administered 2021-04-12: 5 mg

## 2021-04-12 MED ORDER — OXYCODONE HCL 5 MG PO TABS
ORAL_TABLET | ORAL | Status: AC
  Filled 2021-04-12: qty 1

## 2021-04-12 MED ORDER — BUPIVACAINE HCL (PF) 0.25 % IJ SOLN
INTRAMUSCULAR | Status: AC
  Filled 2021-04-12: qty 30

## 2021-04-12 MED ORDER — FENTANYL CITRATE (PF) 100 MCG/2ML IJ SOLN
25.0000 ug | INTRAMUSCULAR | Status: DC | PRN
  Administered 2021-04-12: 50 ug via INTRAVENOUS

## 2021-04-12 SURGICAL SUPPLY — 66 items
APL PRP STRL LF DISP 70% ISPRP (MISCELLANEOUS) ×1
BIT DRILL 2.2 SS TIBIAL (BIT) ×1 IMPLANT
BLADE SURG 15 STRL LF DISP TIS (BLADE) ×1 IMPLANT
BLADE SURG 15 STRL SS (BLADE) ×4
BNDG CMPR 9X4 STRL LF SNTH (GAUZE/BANDAGES/DRESSINGS) ×1
BNDG ELASTIC 3X5.8 VLCR STR LF (GAUZE/BANDAGES/DRESSINGS) ×2 IMPLANT
BNDG ESMARK 4X9 LF (GAUZE/BANDAGES/DRESSINGS) ×2 IMPLANT
BNDG GAUZE ELAST 4 BULKY (GAUZE/BANDAGES/DRESSINGS) ×2 IMPLANT
BNDG PLASTER X FAST 3X3 WHT LF (CAST SUPPLIES) ×20 IMPLANT
BNDG PLSTR 9X3 FST ST WHT (CAST SUPPLIES) ×10
CHLORAPREP W/TINT 26 (MISCELLANEOUS) ×1 IMPLANT
CORD BIPOLAR FORCEPS 12FT (ELECTRODE) ×2 IMPLANT
COVER BACK TABLE 60X90IN (DRAPES) ×2 IMPLANT
COVER MAYO STAND STRL (DRAPES) ×2 IMPLANT
CUFF TOURN SGL QUICK 18X4 (TOURNIQUET CUFF) IMPLANT
CUFF TOURN SGL QUICK 24 (TOURNIQUET CUFF)
CUFF TRNQT CYL 24X4X16.5-23 (TOURNIQUET CUFF) IMPLANT
DRAPE EXTREMITY T 121X128X90 (DISPOSABLE) ×2 IMPLANT
DRAPE OEC MINIVIEW 54X84 (DRAPES) ×2 IMPLANT
DRAPE SURG 17X23 STRL (DRAPES) ×2 IMPLANT
DURAPREP 26ML APPLICATOR (WOUND CARE) IMPLANT
GAUZE SPONGE 4X4 12PLY STRL (GAUZE/BANDAGES/DRESSINGS) ×2 IMPLANT
GAUZE XEROFORM 1X8 LF (GAUZE/BANDAGES/DRESSINGS) IMPLANT
GLOVE SURG ENC MOIS LTX SZ7 (GLOVE) ×1 IMPLANT
GLOVE SURG POLYISO LF SZ7 (GLOVE) ×1 IMPLANT
GLOVE SURG UNDER POLY LF SZ7 (GLOVE) ×1 IMPLANT
GOWN STRL REUS W/ TWL LRG LVL3 (GOWN DISPOSABLE) ×1 IMPLANT
GOWN STRL REUS W/TWL LRG LVL3 (GOWN DISPOSABLE)
GOWN STRL REUS W/TWL XL LVL3 (GOWN DISPOSABLE) ×4 IMPLANT
K-WIRE 1.6 (WIRE) ×10
K-WIRE FX5X1.6XNS BN SS (WIRE) ×5
KWIRE FX5X1.6XNS BN SS (WIRE) IMPLANT
NDL HYPO 25X1 1.5 SAFETY (NEEDLE) IMPLANT
NEEDLE HYPO 25X1 1.5 SAFETY (NEEDLE) IMPLANT
NS IRRIG 1000ML POUR BTL (IV SOLUTION) ×2 IMPLANT
PACK BASIN DAY SURGERY FS (CUSTOM PROCEDURE TRAY) ×2 IMPLANT
PAD CAST 3X4 CTTN HI CHSV (CAST SUPPLIES) ×1 IMPLANT
PAD CAST 4YDX4 CTTN HI CHSV (CAST SUPPLIES) IMPLANT
PADDING CAST COTTON 3X4 STRL (CAST SUPPLIES) ×2
PADDING CAST COTTON 4X4 STRL (CAST SUPPLIES) ×2
PLATE DVR VOLAR RIM NRW L ST (Plate) ×1 IMPLANT
SCREW LOCK 14X2.7X 3 LD TPR (Screw) IMPLANT
SCREW LOCK 16X2.7X 3 LD TPR (Screw) IMPLANT
SCREW LOCK 18X2.7X 3 LD TPR (Screw) IMPLANT
SCREW LOCKING 2.7X14 (Screw) ×8 IMPLANT
SCREW LOCKING 2.7X16 (Screw) ×2 IMPLANT
SCREW LOCKING 2.7X18 (Screw) ×2 IMPLANT
SCREW LP NL 2.7X22MM (Screw) ×1 IMPLANT
SCREW MULTI DIRECTIONAL 2.7X14 (Screw) ×1 IMPLANT
SCREW MULTI DIRECTIONAL 2.7X18 (Screw) ×2 IMPLANT
SCREW MULTI DIRECTIONAL 2.7X20 (Screw) ×1 IMPLANT
SCREW MULTI DIRECTIONAL 2.7X22 (Screw) ×1 IMPLANT
SLEEVE SCD COMPRESS KNEE MED (STOCKING) IMPLANT
SLING ARM FOAM STRAP LRG (SOFTGOODS) ×1 IMPLANT
SPLINT PLASTER CAST XFAST 4X15 (CAST SUPPLIES) IMPLANT
SPLINT PLASTER XTRA FAST SET 4 (CAST SUPPLIES) ×10
STOCKINETTE 4X48 STRL (DRAPES) ×2 IMPLANT
SUT ETHILON 4 0 PS 2 18 (SUTURE) ×2 IMPLANT
SUT FIBERWIRE 3-0 18 TAPR NDL (SUTURE) ×2
SUT MNCRL AB 3-0 PS2 18 (SUTURE) ×1 IMPLANT
SUT VICRYL 4-0 PS2 18IN ABS (SUTURE) ×2 IMPLANT
SUTURE FIBERWR 3-0 18 TAPR NDL (SUTURE) IMPLANT
SYR BULB EAR ULCER 3OZ GRN STR (SYRINGE) ×2 IMPLANT
SYR CONTROL 10ML LL (SYRINGE) IMPLANT
TOWEL GREEN STERILE FF (TOWEL DISPOSABLE) ×4 IMPLANT
UNDERPAD 30X36 HEAVY ABSORB (UNDERPADS AND DIAPERS) ×2 IMPLANT

## 2021-04-12 NOTE — H&P (Signed)
  H&P update  The surgical history has been reviewed and remains accurate without interval change.  The patient was re-examined and patient's physiologic condition has not changed since the most recent office visit. The condition still exists that makes this procedure necessary. The treatment plan remains the same, without new options for care.  No new pharmacological allergies or types of therapy has been initiated that would change the plan or the appropriateness of the plan.  The patient and/or family understand the potential benefits and risks.  04/12/2021 2:00 PM

## 2021-04-12 NOTE — H&P (Signed)
H&P update  The surgical history has been reviewed and remains accurate without interval change.  The patient was re-examined and patient's physiologic condition has not changed since the most recent office visit. The condition still exists that makes this procedure necessary. The treatment plan remains the same, without new options for care.  No new pharmacological allergies or types of therapy has been initiated that would change the plan or the appropriateness of the plan.  The patient and/or family understand the potential benefits and risks.  04/12/2021 2:09 PM

## 2021-04-12 NOTE — Brief Op Note (Signed)
04/12/2021  4:56 PM  PATIENT:  Meredith Shannon  65 y.o. female  PRE-OPERATIVE DIAGNOSIS:  left distal radius fracture  POST-OPERATIVE DIAGNOSIS:  left distal radius fracture  PROCEDURE:  Procedure(s): OPEN REDUCTION INTERNAL FIXATION (ORIF) LEFT DISTAL RADIUS FRACTURE (Left)  SURGEON:  Surgeon(s) and Role:    * Sherilyn Cooter, MD - Primary  PHYSICIAN ASSISTANT:   ASSISTANTS: none   ANESTHESIA:   regional  EBL:  10 mL   BLOOD ADMINISTERED:none  DRAINS: none   LOCAL MEDICATIONS USED:  NONE  SPECIMEN:  No Specimen  DISPOSITION OF SPECIMEN:  N/A  COUNTS:  YES  TOURNIQUET:   Total Tourniquet Time Documented: Upper Arm (Left) - 117 minutes Total: Upper Arm (Left) - 117 minutes   DICTATION: .Viviann Spare Dictation  PLAN OF CARE: Discharge to home after PACU  PATIENT DISPOSITION:  PACU - hemodynamically stable.   Delay start of Pharmacological VTE agent (>24hrs) due to surgical blood loss or risk of bleeding: not applicable

## 2021-04-12 NOTE — Anesthesia Preprocedure Evaluation (Addendum)
Anesthesia Evaluation  Patient identified by MRN, date of birth, ID band Patient awake    Reviewed: Allergy & Precautions, NPO status , Patient's Chart, lab work & pertinent test results  Airway Mallampati: II  TM Distance: >3 FB Neck ROM: Full    Dental no notable dental hx. (+) Teeth Intact, Dental Advisory Given   Pulmonary sleep apnea and Continuous Positive Airway Pressure Ventilation , former smoker,    Pulmonary exam normal breath sounds clear to auscultation       Cardiovascular + CAD  Normal cardiovascular exam Rhythm:Regular Rate:Normal  HLD   Neuro/Psych PSYCHIATRIC DISORDERS Anxiety negative neurological ROS     GI/Hepatic Neg liver ROS, GERD  Controlled,  Endo/Other  negative endocrine ROS  Renal/GU negative Renal ROS  negative genitourinary   Musculoskeletal negative musculoskeletal ROS (+)   Abdominal   Peds  Hematology negative hematology ROS (+)   Anesthesia Other Findings   Reproductive/Obstetrics                            Anesthesia Physical Anesthesia Plan  ASA: 3  Anesthesia Plan: MAC and Regional   Post-op Pain Management:  Regional for Post-op pain   Induction: Intravenous  PONV Risk Score and Plan: 2 and Propofol infusion, Treatment may vary due to age or medical condition, Midazolam, Ondansetron and Dexamethasone  Airway Management Planned: Natural Airway  Additional Equipment:   Intra-op Plan:   Post-operative Plan:   Informed Consent: I have reviewed the patients History and Physical, chart, labs and discussed the procedure including the risks, benefits and alternatives for the proposed anesthesia with the patient or authorized representative who has indicated his/her understanding and acceptance.     Dental advisory given  Plan Discussed with: CRNA  Anesthesia Plan Comments:         Anesthesia Quick Evaluation

## 2021-04-12 NOTE — Anesthesia Procedure Notes (Signed)
Anesthesia Regional Block: Supraclavicular block   Pre-Anesthetic Checklist: , timeout performed,  Correct Patient, Correct Site, Correct Laterality,  Correct Procedure, Correct Position, site marked,  Risks and benefits discussed,  Surgical consent,  Pre-op evaluation,  At surgeon's request and post-op pain management  Laterality: Left  Prep: Maximum Sterile Barrier Precautions used, chloraprep       Needles:  Injection technique: Single-shot  Needle Type: Echogenic Stimulator Needle     Needle Length: 9cm  Needle Gauge: 22     Additional Needles:   Procedures:,,,, ultrasound used (permanent image in chart),,    Narrative:  Start time: 04/12/2021 1:15 PM End time: 04/12/2021 1:20 PM Injection made incrementally with aspirations every 5 mL.  Performed by: Personally  Anesthesiologist: Freddrick March, MD  Additional Notes: Monitors applied. No increased pain on injection. No increased resistance to injection. Injection made in 5cc increments. Good needle visualization. Patient tolerated procedure well.

## 2021-04-12 NOTE — Anesthesia Postprocedure Evaluation (Signed)
Anesthesia Post Note  Patient: Meredith Shannon  Procedure(s) Performed: OPEN REDUCTION INTERNAL FIXATION (ORIF) LEFT DISTAL RADIUS FRACTURE (Left: Wrist)     Patient location during evaluation: PACU Anesthesia Type: Regional and MAC Level of consciousness: awake and alert Pain management: pain level controlled Vital Signs Assessment: post-procedure vital signs reviewed and stable Respiratory status: spontaneous breathing, nonlabored ventilation, respiratory function stable and patient connected to nasal cannula oxygen Cardiovascular status: stable and blood pressure returned to baseline Postop Assessment: no apparent nausea or vomiting Anesthetic complications: no   No notable events documented.  Last Vitals:  Vitals:   04/12/21 1700 04/12/21 1706  BP: (!) 144/76   Pulse: 64   Resp: 15   Temp:    SpO2: 96% 95%    Last Pain:  Vitals:   04/12/21 1700  TempSrc:   PainSc: 3                  Fernandez Kenley L Airyana Sprunger

## 2021-04-12 NOTE — Transfer of Care (Signed)
Immediate Anesthesia Transfer of Care Note  Patient: Meredith Shannon  Procedure(s) Performed: OPEN REDUCTION INTERNAL FIXATION (ORIF) LEFT DISTAL RADIUS FRACTURE (Left: Wrist)  Patient Location: PACU  Anesthesia Type:MAC combined with regional for post-op pain  Level of Consciousness: awake, alert  and oriented  Airway & Oxygen Therapy: Patient Spontanous Breathing and Patient connected to face mask oxygen  Post-op Assessment: Report given to RN and Post -op Vital signs reviewed and stable  Post vital signs: Reviewed and stable  Last Vitals:  Vitals Value Taken Time  BP    Temp    Pulse 64 04/12/21 1642  Resp 17 04/12/21 1642  SpO2 96 % 04/12/21 1642  Vitals shown include unvalidated device data.  Last Pain:  Vitals:   04/12/21 1209  TempSrc: Oral  PainSc: 2       Patients Stated Pain Goal: 3 (20/94/70 9628)  Complications: No notable events documented.

## 2021-04-12 NOTE — Progress Notes (Signed)
Assisted Dr. Lanetta Inch with left, ultrasound guided, supraclavicular block. Side rails up, monitors on throughout procedure. See vital signs in flow sheet. Tolerated Procedure well.

## 2021-04-12 NOTE — Discharge Instructions (Addendum)
May take Tylenol after 6:15pm, if needed.    Post Anesthesia Home Care Instructions  Activity: Get plenty of rest for the remainder of the day. A responsible individual must stay with you for 24 hours following the procedure.  For the next 24 hours, DO NOT: -Drive a car -Paediatric nurse -Drink alcoholic beverages -Take any medication unless instructed by your physician -Make any legal decisions or sign important papers.  Meals: Start with liquid foods such as gelatin or soup. Progress to regular foods as tolerated. Avoid greasy, spicy, heavy foods. If nausea and/or vomiting occur, drink only clear liquids until the nausea and/or vomiting subsides. Call your physician if vomiting continues.  Special Instructions/Symptoms: Your throat may feel dry or sore from the anesthesia or the breathing tube placed in your throat during surgery. If this causes discomfort, gargle with warm salt water. The discomfort should disappear within 24 hours.  If you had a scopolamine patch placed behind your ear for the management of post- operative nausea and/or vomiting:  1. The medication in the patch is effective for 72 hours, after which it should be removed.  Wrap patch in a tissue and discard in the trash. Wash hands thoroughly with soap and water. 2. You may remove the patch earlier than 72 hours if you experience unpleasant side effects which may include dry mouth, dizziness or visual disturbances. 3. Avoid touching the patch. Wash your hands with soap and water after contact with the patch.    Regional Anesthesia Blocks  1. Numbness or the inability to move the "blocked" extremity may last from 3-48 hours after placement. The length of time depends on the medication injected and your individual response to the medication. If the numbness is not going away after 48 hours, call your surgeon.  2. The extremity that is blocked will need to be protected until the numbness is gone and the  Strength has  returned. Because you cannot feel it, you will need to take extra care to avoid injury. Because it may be weak, you may have difficulty moving it or using it. You may not know what position it is in without looking at it while the block is in effect.  3. For blocks in the legs and feet, returning to weight bearing and walking needs to be done carefully. You will need to wait until the numbness is entirely gone and the strength has returned. You should be able to move your leg and foot normally before you try and bear weight or walk. You will need someone to be with you when you first try to ensure you do not fall and possibly risk injury.  4. Bruising and tenderness at the needle site are common side effects and will resolve in a few days.  5. Persistent numbness or new problems with movement should be communicated to the surgeon or the [facility] [phone] [place] [phone]).         Audria Nine, M.D. Hand Surgery  POST-OPERATIVE DISCHARGE INSTRUCTIONS   PRESCRIPTIONS: You have been given a prescription to be taken as directed for post-operative pain control.  You may also take over the counter ibuprofen/aleve and tylenol for pain. Take this as directed on the packaging. Do not exceed 3000 mg tylenol/acetaminophen in 24 hours.  Ibuprofen 600-800 mg (3-4) tablets by mouth every 6 hours as needed for pain.  OR Aleve 2 tablets by mouth every 12 hours (twice daily) as needed for pain.  AND/OR Tylenol 1000  mg (2 tablets) every 8 hours as needed for pain.  Please use your pain medication carefully, as refills are limited and you may not be provided with one.  As stated above, please use over the counter pain medicine - it will also be helpful with decreasing your swelling.    ANESTHESIA: After your surgery, post-surgical discomfort or pain is likely. This discomfort can last several days to a few weeks. At certain times of the day your  discomfort may be more intense.   Did you receive a nerve block?  A nerve block can provide pain relief for one hour to two days after your surgery. As long as the nerve block is working, you will experience little or no sensation in the area the surgeon operated on.  As the nerve block wears off, you will begin to experience pain or discomfort. It is very important that you begin taking your prescribed pain medication before the nerve block fully wears off. Treating your pain at the first sign of the block wearing off will ensure your pain is better controlled and more tolerable when full-sensation returns. Do not wait until the pain is intolerable, as the medicine will be less effective. It is better to treat pain in advance than to try and catch up.   General Anesthesia:  If you did not receive a nerve block during your surgery, you will need to start taking your pain medication shortly after your surgery and should continue to do so as prescribed by your surgeon.     ICE AND ELEVATION: You may use ice for the first 48-72 hours, but it is not critical.   Motion of your fingers is very important s to decrease the swelling. Please follow the finger range of motion exercises below to assist you in regaining your motion. This should be done at least 10 repetitions 3-4 times a day.  Elevation, as much as possible for the next 48 hours, is critical for decreasing swelling as well as for pain relief. Elevation means when you are seated or lying down, you hand should be at or above your heart. When walking, the hand needs to be at or above the level of your elbow.  If the bandage gets too tight, it may need to be loosened. Please contact our office and we will instruct you in how to do this.    SURGICAL BANDAGES:  Keep your dressing and/or splint clean and dry at all times.  Do not remove until you are seen again in the office.  If careful, you may place a plastic bag over your bandage and tape the end  to shower, but be careful, do not get your bandages wet.     HAND THERAPY:  You may not need any. If you do, we will begin this at your follow up visit in the clinic.    ACTIVITY AND WORK: You are encouraged to move any fingers which are not in the bandage. Attached is an instruction sheet on finger motion. Do this as much as you need to make your fingers move fully and keep the swelling down.  Light use of the fingers is allowed to assist the other hand with daily hygiene and eating, but strong gripping or lifting is often uncomfortable and should be avoided.  You might miss a variable period of time from work and hopefully this issue has been discussed prior to surgery. You may not do any heavy work with your affected hand for about 2  weeks.    [facility] [address] [place],  [place]  [address] [phone]

## 2021-04-12 NOTE — Interval H&P Note (Signed)
History and Physical Interval Note:  04/12/2021 2:11 PM  Meredith Shannon  has presented today for surgery, with the diagnosis of left distal radius fracture.  The various methods of treatment have been discussed with the patient and family. After consideration of risks, benefits and other options for treatment, the patient has consented to  Procedure(s): OPEN REDUCTION INTERNAL FIXATION (ORIF) LEFT DISTAL RADIUS FRACTURE (Left) as a surgical intervention.  The patient's history has been reviewed, patient examined, no change in status, stable for surgery.  I have reviewed the patient's chart and labs.  Questions were answered to the patient's satisfaction.     Meredith Shannon [name]

## 2021-04-12 NOTE — Op Note (Addendum)
Date of Surgery: 04/12/2021  INDICATIONS: Ms. Meredith Shannon is a 65 y.o.-year-old female with a left intra-articular distal radius fracture that occurred approximately 11 days ago.  She slipped and fell on the first day of a cruise.  She was splinted on the ship.  Risks, benefits, and alternatives to surgery were discussed.  The patient did consent to the procedure after extensive discussion.   PREOPERATIVE DIAGNOSIS: Closed, displaced, intra-articular fracture of the left distal radius  POSTOPERATIDIAGNOSIS: Same.  PROCEDURE: Open treatment of intra-articular distal radial fracture with internal fixation of three or more fragments (47096)   SURGEON: Audria Nine, M.D.  ASSIST:   ANESTHESIA:  Regional  IV FLUIDS AND URINE: See anesthesia.  ESTIMATED BLOOD LOSS: 10 mL.  IMPLANTS: [employer] DVT volar rim plate  DRAINS: None  COMPLICATIONS: see description of procedure.  DESCRIPTION OF PROCEDURE: The patient was met in the preoperative holding area where the surgical site was marked and the consent form was verified.  The patient was then taken to the operating room and transferred to the operating table.  All bony prominences were well padded.  A tourniquet was applied to the left upper arm.  The operative extremity was prepped and draped in the usual and sterile fashion.  A formal time-out was performed to confirm that this was the correct patient, surgery, side, and site.   I began by making an extended FCR approach to distal radius.  The skin and subcutaneous tissue were divided.  Small cutaneous vessels were coagulated with bipolar electrocautery.  The FCR sheath was incised.  The superficial branch of the radial artery was identified in the distal aspect of the wound and protected.  The FCR tendon was then retracted ulnarly.  The floor of the FCR sheath was then incised.  The FPL muscle belly and tendon were identified.  Blunt dissection was used to develop Parona's space between  the FPL and the pronator.  The pronator quadratus was sharply incised from the watershed line and the radial aspect of the distal radius.  A elevator was used to subperiosteally dissected the pronator off of the radius.  The fracture line was quite distal.  There were multiple fragments including a separate volar rim and radial styloid fragment. There was an approximately 0.5 x 0.5 cm free articular fragment that had been driven into the cancellous metaphyseal bone. There was a dye punch type pattern to the midportion of the articular surface.  The dorsal cortex was comminuted. The Blunt dissection was used to identify the broad brachioradialis tendon as it inserts onto the radial styloid.  This was sharply incised taking care to protect the first dorsal compartment underneath.  There was a fair amount of early fracture callus in the fracture bed.  This was debrided using a combination of rongeur and curette.  The fracture was then reduced using a combination of traction, wrist flexion, and pressure on the radial metaphysis.  The styloid fragment was reduced with ulnar deviation of the wrist and pressure on the fragment with a dental pick.  Several K wires were used percutaneously to hold the styloid fragment to the remainder of the metaphysis.  A volar rim plate was selected given the separate rim fragment.  The appropriate size was chosen and was placed on the radius.  I was not able to get adequate volar tilt  with a manual reduction so the decision was made to fix the plate to the radius distally first and then use the proximal portion of  the plate to lever the distal fragment into appropriate volar tilt.  The plate was pinned in place in appropriate position proximal to distal and radial to ulnar was confirmed.  We then began to fill the distal screw holes in the plate.  Because the fracture line was quite distal, the plate was placed slightly more distal than usual and the screws aimed proximal to ensure they  were under the articular surface in the subchondral bone.l With the distal holes filled manual pressure was placed on the proximal portion of the plate to bring it back down to the radial shaft.  A bicortical screw was then placed in the oblong hole of the plate to bring the plate down to the bone.  This improved the previous dorsal angulation of the distal fragment.  Two additional cortical screws and two locking screws were then filled in the proximal portion of the plate.  With our plate in place the styloid K wires were then removed.  Final fluoroscopic images were obtained demonstrating appropriate alignment of the articular surface on the AP view.  The previous dorsal angulation was improved with neutral volar tilt.  A 30 degree lateral view demonstrated that the screws were not in the joint and were in subchondral bone.  We then thoroughly irrigated the wound and closed in layered fashion.  The subcutaneous tissue was closed using a 3-0 Monocryl suture in a buried fashion.  Skin was closed using a 4-0 nylon in horizontal mattress fashion.  The tourniquet was let down and the wound was dressed using Xeroform, 4 x 4's.  A well-padded volar splint was applied.  The patient was then reversed from sedation and transferred to the postoperative bed.  She was then taken to the PACU in stable condition.  All counts were correct observed in the case.   POSTOPERATIVE PLAN: She will be transferred to the PACU.  She will be discharged home tonight.  She has been given a prescription for PO oxycodone 61m PO x 4 hours.  She has been given instructions regarding pain management, elevation, and finger range of motion exercises.  I'll see her back in the office in 10-14 days for her first follow up visit.   CAudria Nine MD 5:38 PM

## 2021-04-14 DIAGNOSIS — Z90710 Acquired absence of both cervix and uterus: Secondary | ICD-10-CM | POA: Insufficient documentation

## 2021-04-15 ENCOUNTER — Encounter (HOSPITAL_BASED_OUTPATIENT_CLINIC_OR_DEPARTMENT_OTHER): Payer: Self-pay | Admitting: Orthopedic Surgery

## 2021-04-18 ENCOUNTER — Encounter (HOSPITAL_BASED_OUTPATIENT_CLINIC_OR_DEPARTMENT_OTHER): Payer: Self-pay | Admitting: *Deleted

## 2021-04-26 ENCOUNTER — Ambulatory Visit (INDEPENDENT_AMBULATORY_CARE_PROVIDER_SITE_OTHER): Payer: BC Managed Care – PPO

## 2021-04-26 ENCOUNTER — Encounter: Payer: Self-pay | Admitting: Orthopedic Surgery

## 2021-04-26 ENCOUNTER — Ambulatory Visit (INDEPENDENT_AMBULATORY_CARE_PROVIDER_SITE_OTHER): Payer: BC Managed Care – PPO | Admitting: Orthopedic Surgery

## 2021-04-26 ENCOUNTER — Other Ambulatory Visit: Payer: Self-pay

## 2021-04-26 VITALS — BP 138/89 | HR 84 | Ht 70.0 in | Wt 215.0 lb

## 2021-04-26 DIAGNOSIS — Z9889 Other specified postprocedural states: Secondary | ICD-10-CM

## 2021-04-26 DIAGNOSIS — S52572A Other intraarticular fracture of lower end of left radius, initial encounter for closed fracture: Secondary | ICD-10-CM

## 2021-04-26 NOTE — Progress Notes (Signed)
Office Visit Note   Patient: Meredith Shannon           Date of Birth: [birth year]           MRN: [id number] Visit Date: 04/26/2021              Requested by: Aletha Halim., PA-C [id number],  [place] [address] PCP: Aletha Halim., PA-C   Assessment & Plan: Visit Diagnoses:  1. Post-operative state   2. Other closed intra-articular fracture of distal end of left radius, initial encounter     Plan: This is her first postop visit.  Discussed with patient that the PA view of the fracture looks worse than it did intraoperatively w/ apparent collapse of the radial styloid fragment with step off.  The lateral view is also concerning for an intra-articular screw and collapse of the dorsal cortex.  We will get a CT scan to further evaluate. Discussed with patient that this was a very difficult fracture with a distal fracture line and significant comminution.  It may need revision surgery with possible dorsal spanning plate depending on the CT findings.   Follow-Up Instructions: No follow-ups on file.   Orders:  Orders Placed This Encounter  Procedures   XR Wrist 2 Views Left   CT WRIST LEFT WO CONTRAST   No orders of the defined types were placed in this encounter.     Procedures: No procedures performed   Clinical Data: No additional findings.   Subjective: Chief Complaint  Patient presents with   Left Wrist - Post-op Follow-up    This is a 65 yo RHD F s/p ORIF L distal radius fracture after a GLF on a cruise ship.  Patient presents for her first postop visit.  She has been in a splint.  She complains of continued soreness in the wrist.  She has some mild paresthesias in her fingertips but sensation is intact to light touch.     Review of Systems  Constitutional: Negative.   Respiratory: Negative.    Cardiovascular: Negative.   Skin: Negative.   Neurological: Negative.     Objective: Vital Signs: BP 138/89 (BP Location: Right Arm, Patient  Position: Sitting, Cuff Size: Normal)   Pulse 84   Ht 5\' 10"  (1.778 m)   Wt 215 lb (97.5 kg)   LMP 08/04/2006   SpO2 96%   BMI 30.85 kg/m   Physical Exam Constitutional:      Appearance: Normal appearance.  Cardiovascular:     Rate and Rhythm: Normal rate.     Pulses: Normal pulses.  Pulmonary:     Effort: Pulmonary effort is normal.  Skin:    General: Skin is warm and dry.     Capillary Refill: Capillary refill takes less than 2 seconds.  Neurological:     General: No focal deficit present.     Mental Status: She is alert.    Left Hand Exam   Comments:  Incision clean and dry.  Sutures removed.  Moderate swelling.  SILT fingertips.      Specialty Comments:  No specialty comments available.  Imaging: 3V of the left wrist taken today demonstrate collapse of the styloid fragment with intra-articular step off.  One of the lateral views suggests collapse of the dorsal cortex w/ concern for intra-articular screw penetration.    PMFS History: Patient Active Problem List   Diagnosis Date Noted   H/O: hysterectomy 04/14/2021   Distal radius fracture, left 04/09/2021  OSA (obstructive sleep apnea) 12/11/2020   Polyphagia 10/22/2020   Secondary erythrocytosis 09/14/2020   Class 1 obesity due to excess calories with body mass index (BMI) of 30.0 to 30.9 in adult 08/22/2020   Acid reflux 08/22/2020   Dysphagia 08/22/2020   Globus sensation 05/18/2018   Chest pain with moderate risk for cardiac etiology - concern for coronary spasm 08/26/2016   Dyspareunia, female 98/92/1194   Lichen simplex chronicus 06/01/2015   Hyperlipidemia 06/01/2015   Other and unspecified hyperlipidemia 04/14/2014   Past Medical History:  Diagnosis Date   Anxiety    Broken wrist 2008   Cancer [facility])    basal cell   Chest pain 2018   seen by Dr. Ellyn Hack.  Has mid LAD stenosis.   Chest pain    Constipation    GERD (gastroesophageal reflux disease)    occasional uses tums   High  cholesterol    In total cholesterol and triglycerides.   High triglycerides    HSV-1 infection    Joint pain    Lichen simplex chronicus    Sleep apnea    Swallowing difficulty    Vitamin D deficiency     Family History  Problem Relation Age of Onset   Osteoporosis Mother    High blood pressure Mother    High Cholesterol Mother    Depression Mother    Sudden death Father    Alcohol abuse Father     Past Surgical History:  Procedure Laterality Date   BREAST BIOPSY  5/12   fibrocystic changes   CYSTOSCOPY N/A 03/09/2017   Procedure: CYSTOSCOPY;  Surgeon: Megan Salon, MD;  Location: [facility];  Service: Gynecology;  Laterality: N/A;   DILATATION & CURETTAGE/HYSTEROSCOPY WITH MYOSURE N/A 12/26/2016   Procedure: [place];  Surgeon: Megan Salon, MD;  Location: [facility];  Service: Gynecology;  Laterality: N/A;   DILATION AND CURETTAGE OF UTERUS     "years ago"   FACIAL COSMETIC SURGERY     HAND SURGERY Right 2012   fractured bone in hands   LAPAROSCOPIC HYSTERECTOMY N/A 03/09/2017   Procedure: HYSTERECTOMY TOTAL LAPAROSCOPIC;  Surgeon: Megan Salon, MD;  Location: [facility];  Service: Gynecology;  Laterality: N/A;   LAPAROSCOPIC SALPINGO OOPHERECTOMY Bilateral 03/09/2017   Procedure: LAPAROSCOPIC SALPINGO OOPHORECTOMY;  Surgeon: Megan Salon, MD;  Location: [facility];  Service: Gynecology;  Laterality: Bilateral;   OPEN REDUCTION INTERNAL FIXATION (ORIF) DISTAL RADIAL FRACTURE Left 04/12/2021   Procedure: OPEN REDUCTION INTERNAL FIXATION (ORIF) LEFT DISTAL RADIUS FRACTURE;  Surgeon: Sherilyn Cooter, MD;  Location: [facility];  Service: Orthopedics;  Laterality: Left;   TUBAL LIGATION  1983   WRIST FRACTURE SURGERY Right 2008   Social History   Occupational History   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

## 2021-04-29 ENCOUNTER — Telehealth: Payer: Self-pay | Admitting: Orthopedic Surgery

## 2021-04-29 ENCOUNTER — Ambulatory Visit
Admission: RE | Admit: 2021-04-29 | Discharge: 2021-04-29 | Disposition: A | Discharge status: Home / Self Care | Payer: BC Managed Care – PPO | Source: Ambulatory Visit | Attending: Orthopedic Surgery | Admitting: Orthopedic Surgery

## 2021-04-29 DIAGNOSIS — S52612A Displaced fracture of left ulna styloid process, initial encounter for closed fracture: Secondary | ICD-10-CM | POA: Diagnosis not present

## 2021-04-29 DIAGNOSIS — S52592D Other fractures of lower end of left radius, subsequent encounter for closed fracture with routine healing: Secondary | ICD-10-CM | POA: Diagnosis not present

## 2021-04-29 DIAGNOSIS — S52572A Other intraarticular fracture of lower end of left radius, initial encounter for closed fracture: Secondary | ICD-10-CM

## 2021-04-29 NOTE — Telephone Encounter (Signed)
Patient called. She would like a call back. Says her hand is swollen and is in pain. Her call back number is [phone]

## 2021-04-30 ENCOUNTER — Encounter: Payer: Self-pay | Admitting: Orthopedic Surgery

## 2021-04-30 ENCOUNTER — Other Ambulatory Visit: Payer: Self-pay

## 2021-04-30 ENCOUNTER — Other Ambulatory Visit: Payer: Self-pay | Admitting: Orthopedic Surgery

## 2021-04-30 ENCOUNTER — Ambulatory Visit (INDEPENDENT_AMBULATORY_CARE_PROVIDER_SITE_OTHER): Payer: BC Managed Care – PPO | Admitting: Orthopedic Surgery

## 2021-04-30 DIAGNOSIS — S52572A Other intraarticular fracture of lower end of left radius, initial encounter for closed fracture: Secondary | ICD-10-CM

## 2021-04-30 NOTE — Progress Notes (Signed)
Post-Op Visit Note   Patient: Meredith Shannon           Date of Birth: July 06, 1956           MRN: [id number] Visit Date: 04/30/2021 PCP: Aletha Halim., PA-C   Assessment & Plan:  Chief Complaint:  Chief Complaint  Patient presents with   Left Wrist - Routine Post Op    ORIF Left wrist, CT Scan review   Visit Diagnoses:  1. Other closed intra-articular fracture of distal end of left radius, initial encounter     Plan: Reviewed CT results with patient.  She has increased dorsal tilt compared to the intraop fluoro films.  There is one intra-articular screw that's just barely into the radiolunate joint.  She has radioscaphoid joint space narrowing that suggests pre-existing radiocarpal arthritis.  She still has moderate swelling that seems to be secondary to the ACE wrap being too tight proximally and distally. At this point, we will treat her in a cast until the fracture heals at which point we will remove the plate.  In the mean time we will start hand therapy to work on finger ROM to prevent stiffness.   Follow-Up Instructions: No follow-ups on file.   Orders:  No orders of the defined types were placed in this encounter.  No orders of the defined types were placed in this encounter.   Imaging: CT WRIST LEFT WO CONTRAST  Result Date: 04/29/2021 CLINICAL DATA:  Follow-up distal radius fracture. EXAM: CT OF THE LEFT WRIST WITHOUT CONTRAST TECHNIQUE: Multidetector CT imaging was performed according to the standard protocol. Multiplanar CT image reconstructions were also generated. COMPARISON:  Left wrist x-rays dated April 26, 2021. FINDINGS: Bones/Joint/Cartilage Subacute comminuted impacted fracture distal radius status post volar plate and screw fixation. One of the screws just barely extends into the radiolunate joint (series 3, image 117; series 7, image 63). No significant bridging bone. Impaction and slight articular surface step-off of the radial styloid fragment  similar to prior x-rays. Narrowing of the radioscaphoid joint. No dislocation. Subacute avulsion fracture of the ulnar styloid process. No joint effusion. Ligaments Ligaments are suboptimally evaluated by CT. Muscles and Tendons Grossly intact. Soft tissue No fluid collection or hematoma.  No soft tissue mass. IMPRESSION: 1. Subacute comminuted impacted fracture of the distal radius status post volar plate and screw fixation. One of the screws just barely extends into the radiolunate joint. Mild impaction and slight articular surface step-off of the radial styloid fragment is similar to prior x-rays. 2. Subacute avulsion fracture of the ulnar styloid process. Electronically Signed   By: Titus Dubin M.D.   On: 04/29/2021 13:46    PMFS History: Patient Active Problem List   Diagnosis Date Noted   H/O: hysterectomy 04/14/2021   Distal radius fracture, left 04/09/2021   OSA (obstructive sleep apnea) 12/11/2020   Polyphagia 10/22/2020   Secondary erythrocytosis 09/14/2020   Class 1 obesity due to excess calories with body mass index (BMI) of 30.0 to 30.9 in adult 08/22/2020   Acid reflux 08/22/2020   Dysphagia 08/22/2020   Globus sensation 05/18/2018   Chest pain with moderate risk for cardiac etiology - concern for coronary spasm 08/26/2016   Dyspareunia, female 72/04/4708   Lichen simplex chronicus 06/01/2015   Hyperlipidemia 06/01/2015   Other and unspecified hyperlipidemia 04/14/2014   Past Medical History:  Diagnosis Date   Anxiety    Broken wrist 2008   Cancer [facility])    basal cell   Chest pain  2018   seen by Dr. Ellyn Hack.  Has mid LAD stenosis.   Chest pain    Constipation    GERD (gastroesophageal reflux disease)    occasional uses tums   High cholesterol    In total cholesterol and triglycerides.   High triglycerides    HSV-1 infection    Joint pain    Lichen simplex chronicus    Sleep apnea    Swallowing difficulty    Vitamin D deficiency     Family History  Problem  Relation Age of Onset   Osteoporosis Mother    High blood pressure Mother    High Cholesterol Mother    Depression Mother    Sudden death Father    Alcohol abuse Father     Past Surgical History:  Procedure Laterality Date   BREAST BIOPSY  5/12   fibrocystic changes   CYSTOSCOPY N/A 03/09/2017   Procedure: CYSTOSCOPY;  Surgeon: Megan Salon, MD;  Location: [facility];  Service: Gynecology;  Laterality: N/A;   DILATATION & CURETTAGE/HYSTEROSCOPY WITH MYOSURE N/A 12/26/2016   Procedure: [place];  Surgeon: Megan Salon, MD;  Location: [facility];  Service: Gynecology;  Laterality: N/A;   DILATION AND CURETTAGE OF UTERUS     "years ago"   FACIAL COSMETIC SURGERY     HAND SURGERY Right 2012   fractured bone in hands   LAPAROSCOPIC HYSTERECTOMY N/A 03/09/2017   Procedure: HYSTERECTOMY TOTAL LAPAROSCOPIC;  Surgeon: Megan Salon, MD;  Location: [facility];  Service: Gynecology;  Laterality: N/A;   LAPAROSCOPIC SALPINGO OOPHERECTOMY Bilateral 03/09/2017   Procedure: LAPAROSCOPIC SALPINGO OOPHORECTOMY;  Surgeon: Megan Salon, MD;  Location: [facility];  Service: Gynecology;  Laterality: Bilateral;   OPEN REDUCTION INTERNAL FIXATION (ORIF) DISTAL RADIAL FRACTURE Left 04/12/2021   Procedure: OPEN REDUCTION INTERNAL FIXATION (ORIF) LEFT DISTAL RADIUS FRACTURE;  Surgeon: Sherilyn Cooter, MD;  Location: [facility];  Service: Orthopedics;  Laterality: Left;   TUBAL LIGATION  1983   WRIST FRACTURE SURGERY Right 2008   Social History   Occupational History   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 03/09/17       Office Visit Note   Patient: Meredith Shannon           Date of Birth: 11/17/1955           MRN: [id number] Visit Date: 04/30/2021              Requested by: Aletha Halim., PA-C [address]  [place] [address] PCP: Aletha Halim., PA-C   Assessment & Plan: Visit Diagnoses:  1. Other closed intra-articular fracture of distal end of left radius, initial encounter     Plan: Reviewed CT scan w/ patient.  She does have slighly increased dorsal tilt relative to intraop fluoro imaging.  The distal aspect of one of the screws is just in the radiolunate joint.  She has some pre-existing radiocarpal arthritis.  At this point, we will treat her with further immobilization in a cast until the fracture is healed at which point we will start ROM of the wrist.  In the meantime, we will start her in therapy to work on finger ROM to prevent finger stiffness.   Follow-Up Instructions: No follow-ups on file.   Orders:  No orders of the defined types were placed in this encounter.  No orders of the defined types were placed in this encounter.     Procedures: No procedures performed   Clinical Data: No additional findings.   Subjective: Chief Complaint  Patient presents with   Left Wrist - Routine Post Op    ORIF Left wrist, CT Scan review    HPI  Review of Systems   Objective: Vital Signs: LMP 08/04/2006   Physical Exam  Left Hand Exam   Comments:  Moderate swelling distal to end of ACE wrap secondary to wrap being tight.  Able to make complete fist.      Specialty Comments:  No specialty comments available.  Imaging: CT WRIST LEFT WO CONTRAST  Result Date: 04/29/2021 CLINICAL DATA:  Follow-up distal radius fracture. EXAM: CT OF THE LEFT WRIST WITHOUT CONTRAST TECHNIQUE: Multidetector CT imaging was performed  according to the standard protocol. Multiplanar CT image reconstructions were also generated. COMPARISON:  Left wrist x-rays dated April 26, 2021. FINDINGS: Bones/Joint/Cartilage Subacute comminuted impacted fracture distal radius status post volar plate and screw fixation. One of the screws just barely extends into the radiolunate joint (series 3, image 117; series 7, image 63). No significant bridging bone. Impaction and slight articular surface step-off of the radial styloid fragment similar to prior x-rays. Narrowing of the radioscaphoid joint. No dislocation. Subacute avulsion fracture of the ulnar styloid process. No joint effusion. Ligaments Ligaments are suboptimally evaluated by CT. Muscles and Tendons Grossly intact. Soft tissue No fluid collection or hematoma.  No soft tissue mass. IMPRESSION: 1. Subacute comminuted impacted fracture of the distal radius status post volar plate and screw fixation. One of the screws just barely extends into the radiolunate joint. Mild impaction and slight articular surface step-off of the radial styloid fragment is similar to prior x-rays. 2. Subacute avulsion fracture of the ulnar styloid process. Electronically Signed   By: Titus Dubin M.D.   On: 04/29/2021 13:46     PMFS History: Patient Active Problem List   Diagnosis Date Noted   H/O: hysterectomy 04/14/2021   Distal radius fracture, left 04/09/2021   OSA (obstructive sleep apnea) 12/11/2020   Polyphagia 10/22/2020   Secondary erythrocytosis 09/14/2020   Class 1 obesity due to excess calories with body mass index (BMI) of 30.0 to 30.9 in adult 08/22/2020   Acid reflux 08/22/2020   Dysphagia 08/22/2020   Globus sensation 05/18/2018   Chest pain with moderate risk for cardiac etiology - concern for coronary spasm 08/26/2016   Dyspareunia, female 81/85/6314   Lichen simplex chronicus 06/01/2015   Hyperlipidemia 06/01/2015   Other and unspecified hyperlipidemia 04/14/2014   Past Medical  History:  Diagnosis Date   Anxiety    Broken wrist 2008   Cancer [facility])    basal cell   Chest pain 2018   seen by Dr. Ellyn Hack.  Has mid LAD stenosis.   Chest pain    Constipation    GERD (gastroesophageal reflux disease)    occasional uses tums   High cholesterol    In total cholesterol and triglycerides.   High  triglycerides    HSV-1 infection    Joint pain    Lichen simplex chronicus    Sleep apnea    Swallowing difficulty    Vitamin D deficiency     Family History  Problem Relation Age of Onset   Osteoporosis Mother    High blood pressure Mother    High Cholesterol Mother    Depression Mother    Sudden death Father    Alcohol abuse Father     Past Surgical History:  Procedure Laterality Date   BREAST BIOPSY  5/12   fibrocystic changes   CYSTOSCOPY N/A 03/09/2017   Procedure: CYSTOSCOPY;  Surgeon: Megan Salon, MD;  Location: [facility];  Service: Gynecology;  Laterality: N/A;   DILATATION & CURETTAGE/HYSTEROSCOPY WITH MYOSURE N/A 12/26/2016   Procedure: [place];  Surgeon: Megan Salon, MD;  Location: [facility];  Service: Gynecology;  Laterality: N/A;   DILATION AND CURETTAGE OF UTERUS     "years ago"   FACIAL COSMETIC SURGERY     HAND SURGERY Right 2012   fractured bone in hands   LAPAROSCOPIC HYSTERECTOMY N/A 03/09/2017   Procedure: HYSTERECTOMY TOTAL LAPAROSCOPIC;  Surgeon: Megan Salon, MD;  Location: [facility];  Service: Gynecology;  Laterality: N/A;   LAPAROSCOPIC SALPINGO OOPHERECTOMY Bilateral 03/09/2017   Procedure: LAPAROSCOPIC SALPINGO OOPHORECTOMY;  Surgeon: Megan Salon, MD;  Location: [facility];  Service: Gynecology;  Laterality: Bilateral;   OPEN REDUCTION INTERNAL FIXATION (ORIF) DISTAL RADIAL FRACTURE Left 04/12/2021   Procedure: OPEN REDUCTION INTERNAL FIXATION (ORIF) LEFT DISTAL RADIUS FRACTURE;  Surgeon: Sherilyn Cooter, MD;  Location: [facility];  Service: Orthopedics;  Laterality: Left;   TUBAL LIGATION  1983   WRIST FRACTURE SURGERY Right 2008   Social History   Occupational History   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

## 2021-05-02 ENCOUNTER — Ambulatory Visit: Payer: BC Managed Care – PPO | Admitting: Orthopedic Surgery

## 2021-05-06 ENCOUNTER — Telehealth: Payer: Self-pay | Admitting: Orthopedic Surgery

## 2021-05-06 NOTE — Telephone Encounter (Signed)
Received call from patient checking on rtw form for [place] her form faxed on 9/30. She would like form emailed to her [email or web address] Alyse Low will email as requested.

## 2021-05-21 ENCOUNTER — Ambulatory Visit: Payer: BC Managed Care – PPO | Admitting: Orthopedic Surgery

## 2021-05-21 ENCOUNTER — Encounter: Payer: Self-pay | Admitting: Orthopedic Surgery

## 2021-05-21 ENCOUNTER — Ambulatory Visit (INDEPENDENT_AMBULATORY_CARE_PROVIDER_SITE_OTHER): Payer: BC Managed Care – PPO

## 2021-05-21 ENCOUNTER — Other Ambulatory Visit: Payer: Self-pay

## 2021-05-21 DIAGNOSIS — S52572A Other intraarticular fracture of lower end of left radius, initial encounter for closed fracture: Secondary | ICD-10-CM | POA: Diagnosis not present

## 2021-05-21 NOTE — Progress Notes (Addendum)
Office Visit Note   Patient: Meredith Shannon           Date of Birth: [birth year]           MRN: [id number] Visit Date: 05/21/2021              Requested by: Aletha Halim., PA-C [address],  [place] [address] PCP: Aletha Halim., PA-C   Assessment & Plan: Visit Diagnoses:  1. Other closed intra-articular fracture of distal end of left radius, initial encounter     Plan: Discussed with patient that her x-rays are not perfect and there is a step off at the radial styloid fragment.  Discussed that I reached out to several of my hand surgery mentors regarding her case and all recommended continued cast immobilization until the fracture heals rather than attempted revision of that styloid fragment.  We will continue to let the fracture heal in a cast before we remove the hardware.  At that point, will start her in therapy to really work on range of motion.  We discussed that after the plate is out, she will likely continue to see improvement in pain and range of motion for 6 months to a year.  I will see her back in 3 weeks or so for repeat x-rays and can hopefully schedule hardware removal if there is sufficient healing.   Follow-Up Instructions: No follow-ups on file.   Orders:  Orders Placed This Encounter  Procedures   XR Wrist Complete Left   No orders of the defined types were placed in this encounter.     Procedures: No procedures performed   Clinical Data: No additional findings.   Subjective: Chief Complaint  Patient presents with   Left Wrist - Follow-up, Fracture    Cast removed,not noticing much improvement, complaining that when she moves it that it feels like something is shifting in there.    This is a 65 yo RHD F who presents for follow up of a left distal radius fracture s/p ORIF.  She has been in a cast for the last 3 weeks.  Her swelling has improved.  She is still experiencing pain in the wrist.  She denies any numbness or paresthesias.   She recently returned to work.    Review of Systems   Objective: Vital Signs: LMP 08/04/2006   Physical Exam Constitutional:      Appearance: Normal appearance.  Cardiovascular:     Rate and Rhythm: Normal rate.     Pulses: Normal pulses.  Skin:    General: Skin is warm.     Capillary Refill: Capillary refill takes less than 2 seconds.  Neurological:     Mental Status: She is alert.    Left Hand Exam   Other  Erythema: absent Sensation: normal Pulse: present  Comments:  Swelling improved.  Able to make near complete fist but with some stiffness secondary to prolonged immobilization in the cast.      Specialty Comments:  No specialty comments available.  Imaging: 3V of the left wrist taken today are reviewed and interpreted by me.  The previously noted collapse of the styloid fragment remains stable.  There is continued healing of the comminuted dorsal cortex.    PMFS History: Patient Active Problem List   Diagnosis Date Noted   H/O: hysterectomy 04/14/2021   Distal radius fracture, left 04/09/2021   OSA (obstructive sleep apnea) 12/11/2020   Polyphagia 10/22/2020   Secondary erythrocytosis 09/14/2020   Class  1 obesity due to excess calories with body mass index (BMI) of 30.0 to 30.9 in adult 08/22/2020   Acid reflux 08/22/2020   Dysphagia 08/22/2020   Globus sensation 05/18/2018   Chest pain with moderate risk for cardiac etiology - concern for coronary spasm 08/26/2016   Dyspareunia, female 53/74/8270   Lichen simplex chronicus 06/01/2015   Hyperlipidemia 06/01/2015   Other and unspecified hyperlipidemia 04/14/2014   Past Medical History:  Diagnosis Date   Anxiety    Broken wrist 2008   Cancer [facility])    basal cell   Chest pain 2018   seen by Dr. Ellyn Hack.  Has mid LAD stenosis.   Chest pain    Constipation    GERD (gastroesophageal reflux disease)    occasional uses tums   High cholesterol    In total cholesterol and triglycerides.   High  triglycerides    HSV-1 infection    Joint pain    Lichen simplex chronicus    Sleep apnea    Swallowing difficulty    Vitamin D deficiency     Family History  Problem Relation Age of Onset   Osteoporosis Mother    High blood pressure Mother    High Cholesterol Mother    Depression Mother    Sudden death Father    Alcohol abuse Father     Past Surgical History:  Procedure Laterality Date   BREAST BIOPSY  5/12   fibrocystic changes   CYSTOSCOPY N/A 03/09/2017   Procedure: CYSTOSCOPY;  Surgeon: Megan Salon, MD;  Location: [facility];  Service: Gynecology;  Laterality: N/A;   DILATATION & CURETTAGE/HYSTEROSCOPY WITH MYOSURE N/A 12/26/2016   Procedure: [place];  Surgeon: Megan Salon, MD;  Location: [facility];  Service: Gynecology;  Laterality: N/A;   DILATION AND CURETTAGE OF UTERUS     "years ago"   FACIAL COSMETIC SURGERY     HAND SURGERY Right 2012   fractured bone in hands   LAPAROSCOPIC HYSTERECTOMY N/A 03/09/2017   Procedure: HYSTERECTOMY TOTAL LAPAROSCOPIC;  Surgeon: Megan Salon, MD;  Location: [facility];  Service: Gynecology;  Laterality: N/A;   LAPAROSCOPIC SALPINGO OOPHERECTOMY Bilateral 03/09/2017   Procedure: LAPAROSCOPIC SALPINGO OOPHORECTOMY;  Surgeon: Megan Salon, MD;  Location: [facility];  Service: Gynecology;  Laterality: Bilateral;   OPEN REDUCTION INTERNAL FIXATION (ORIF) DISTAL RADIAL FRACTURE Left 04/12/2021   Procedure: OPEN REDUCTION INTERNAL FIXATION (ORIF) LEFT DISTAL RADIUS FRACTURE;  Surgeon: Sherilyn Cooter, MD;  Location: [place];  Service: Orthopedics;  Laterality: Left;   TUBAL LIGATION  1983   WRIST FRACTURE SURGERY Right 2008   Social History   Occupational History   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

## 2021-05-22 ENCOUNTER — Telehealth: Payer: Self-pay | Admitting: Neurology

## 2021-05-22 NOTE — Telephone Encounter (Signed)
She has an Ibreeze that was setup on 03/18/21. In regards to our office hours, she would need to have an appt by Thursday November 10th.

## 2021-05-22 NOTE — Telephone Encounter (Signed)
Pt called needing to know when she is to schedule her Initial Cpap visit. Pt does not know how far out she can schedule.

## 2021-05-22 NOTE — Telephone Encounter (Signed)
Called patient and LVM with end date to have had an appt by. Left office number for pt to call back and schedule. When she calls back, if you don't see anything available between Dr Rexene Alberts and Jinny Blossom, pt can see Janett Billow NP.

## 2021-05-29 ENCOUNTER — Ambulatory Visit (INDEPENDENT_AMBULATORY_CARE_PROVIDER_SITE_OTHER): Payer: BC Managed Care – PPO | Admitting: Adult Health

## 2021-05-29 ENCOUNTER — Encounter: Payer: Self-pay | Admitting: Adult Health

## 2021-05-29 VITALS — BP 144/94 | HR 85 | Ht 70.0 in | Wt 215.0 lb

## 2021-05-29 DIAGNOSIS — Z9989 Dependence on other enabling machines and devices: Secondary | ICD-10-CM | POA: Diagnosis not present

## 2021-05-29 DIAGNOSIS — Z789 Other specified health status: Secondary | ICD-10-CM | POA: Diagnosis not present

## 2021-05-29 DIAGNOSIS — G4733 Obstructive sleep apnea (adult) (pediatric): Secondary | ICD-10-CM

## 2021-05-29 NOTE — Progress Notes (Signed)
[facility] [address] [place]. [place] [address] [phone]       OFFICE FOLLOW UP NOTE  Ms. Meredith Shannon Date of Birth:  09-15-1955 Medical Record Number:  [id number]   Primary neurologist: Dr. Rexene Alberts Reason for visit: Initial CPAP follow-up visit    SUBJECTIVE:   CHIEF COMPLAINT:  Chief Complaint  Patient presents with   Obstructive Sleep Apnea    RM 3 alone Pt is well, states CPAP isn't working as well as she hoped. She is having a lot of leaks      HPI:   Meredith Shannon is a 65 year old right-handed woman with an underlying medical history of reflux disease, hyperlipidemia, vitamin D deficiency, anxiety, joint pain, and mild obesity, who was initially seen by Dr. Rexene Alberts on 10/02/2020 with c/o snoring and excessive daytime somnolence.  Completed HST on 541 2 which showed moderate obstructive sleep apnea with total AHI 15.6/h and O2 nadir of 86%.  Recommend initiating positive airway pressure with AutoPap titration/trial at home.  Initial AutoPap set up date 03/18/2021.    Returns today, 05/29/2021, for initial CPAP compliance visit.  Has had difficulty trying to find a mask that works and doesn't leak - recently obtained a new mask but as she turns side to side, she will have difficulty with leaks. Currently using full face mask . Previously tried nasal pillow mask but difficulty tolerating due to pressure and nasal dryness.    Epworth Sleepiness Scale: 4/24 (initial 13/24)   CPAP compliance report:               ROS:   14 system review of systems performed and negative with exception of those listed in HPI  PMH:  Past Medical History:  Diagnosis Date   Anxiety    Broken wrist 2008   Cancer [facility])    basal cell   Chest pain 2018   seen by Dr. Ellyn Hack.  Has mid LAD stenosis.   Chest pain    Constipation    GERD (gastroesophageal reflux disease)    occasional uses tums   High cholesterol    In total cholesterol and triglycerides.    High triglycerides    HSV-1 infection    Joint pain    Lichen simplex chronicus    Sleep apnea    Swallowing difficulty    Vitamin D deficiency     PSH:  Past Surgical History:  Procedure Laterality Date   BREAST BIOPSY  5/12   fibrocystic changes   CYSTOSCOPY N/A 03/09/2017   Procedure: CYSTOSCOPY;  Surgeon: Megan Salon, MD;  Location: [facility];  Service: Gynecology;  Laterality: N/A;   DILATATION & CURETTAGE/HYSTEROSCOPY WITH MYOSURE N/A 12/26/2016   Procedure: [place];  Surgeon: Megan Salon, MD;  Location: [facility];  Service: Gynecology;  Laterality: N/A;   DILATION AND CURETTAGE OF UTERUS     "years ago"   FACIAL COSMETIC SURGERY     HAND SURGERY Right 2012   fractured bone in hands   LAPAROSCOPIC HYSTERECTOMY N/A 03/09/2017   Procedure: HYSTERECTOMY TOTAL LAPAROSCOPIC;  Surgeon: Megan Salon, MD;  Location: [facility];  Service: Gynecology;  Laterality: N/A;   LAPAROSCOPIC SALPINGO OOPHERECTOMY Bilateral 03/09/2017   Procedure: LAPAROSCOPIC SALPINGO OOPHORECTOMY;  Surgeon: Megan Salon, MD;  Location: [facility];  Service: Gynecology;  Laterality: Bilateral;   OPEN REDUCTION INTERNAL FIXATION (ORIF) DISTAL RADIAL FRACTURE Left 04/12/2021   Procedure: OPEN REDUCTION INTERNAL FIXATION (  ORIF) LEFT DISTAL RADIUS FRACTURE;  Surgeon: Sherilyn Cooter, MD;  Location: [place];  Service: Orthopedics;  Laterality: Left;   TUBAL LIGATION  1983   WRIST FRACTURE SURGERY Right 2008    Social History:  Social History   Socioeconomic History   Marital status: Married    Spouse name: Deidre Ala   Number of children: 1   Years of education: Not on file   Highest education level: Not on file  Occupational History   Occupation: Arboriculturist - Sales  Tobacco Use   Smoking status: Former    Packs/day: 0.50    Types: Cigarettes    Quit date: 08/04/1984    Years since quitting: 36.8    Smokeless tobacco: Never  Vaping Use   Vaping Use: Never used  Substance and Sexual Activity   Alcohol use: Yes    Alcohol/week: 0.0 - 2.0 standard drinks   Drug use: No   Sexual activity: Not Currently    Partners: Male    Birth control/protection: Post-menopausal, Surgical    Comment: [place] 03/09/17   Other Topics Concern   Not on file  Social History Narrative   Not on file   Social Determinants of Health   Financial Resource Strain: Not on file  Food Insecurity: Not on file  Transportation Needs: Not on file  Physical Activity: Not on file  Stress: Not on file  Social Connections: Not on file  Intimate Partner Violence: Not on file    Family History:  Family History  Problem Relation Age of Onset   Osteoporosis Mother    High blood pressure Mother    High Cholesterol Mother    Depression Mother    Sudden death Father    Alcohol abuse Father     Medications:   Current Outpatient Medications on File Prior to Visit  Medication Sig Dispense Refill   acyclovir (ZOVIRAX) 800 MG tablet Take 800 mg by mouth daily.      Calcium Carb-Cholecalciferol (CALCIUM + D3 PO) Take 1 tablet by mouth daily.     Cholecalciferol (VITAMIN D) 2000 UNITS CAPS Take 2,000 Units by mouth daily.      pantoprazole (PROTONIX) 40 MG tablet Take 1 tablet by mouth daily.     rosuvastatin (CRESTOR) 20 MG tablet Take 20 mg by mouth daily.     vitamin B-12 (CYANOCOBALAMIN) 1000 MCG tablet Take 1,000 mcg by mouth daily.     vitamin E 1000 UNIT capsule Take 1,000 Units by mouth daily.     No current facility-administered medications on file prior to visit.    Allergies:   Allergies  Allergen Reactions   Bee Venom Anaphylaxis, Swelling and Rash   Penicillins Swelling, Rash and Other (See Comments)    Tongue swelling Has patient had a PCN reaction causing immediate rash, facial/tongue/throat swelling, SOB or lightheadedness with hypotension:Yes Has patient had a PCN reaction causing severe rash  involving mucus membranes or skin necrosis:No Has patient had a PCN reaction that required hospitalization:Treated & released in ER--No Has patient had a PCN reaction occurring within the last 10 years:No If all of the above answers are "NO", then may proceed with Cephalosporin use.       OBJECTIVE:  Physical Exam  Vitals:   05/29/21 0728  BP: (!) 144/94  Pulse: 85  Weight: 215 lb (97.5 kg)  Height: 5\' 10"  (1.778 m)   Body mass index is 30.85 kg/m. No results found.  General: well developed, well nourished, pleasant middle-age Caucasian  female, seated, in no evident distress Head: head normocephalic and atraumatic.   Neck: supple with no carotid or supraclavicular bruits Cardiovascular: regular rate and rhythm, no murmurs Musculoskeletal: no deformity Skin:  no rash/petichiae Vascular:  Normal pulses all extremities   Neurologic Exam Mental Status: Awake and fully alert. Oriented to place and time. Recent and remote memory intact. Attention span, concentration and fund of knowledge appropriate. Mood and affect appropriate.  Cranial Nerves: Pupils equal, briskly reactive to light. Extraocular movements full without nystagmus. Visual fields full to confrontation. Hearing intact. Facial sensation intact. Face, tongue, palate moves normally and symmetrically.  Motor: Normal bulk and tone. Normal strength in all tested extremity muscles Sensory.: intact to touch , pinprick , position and vibratory sensation.  Coordination: Rapid alternating movements normal in all extremities. Finger-to-nose and heel-to-shin performed accurately bilaterally. Gait and Station: Arises from chair without difficulty. Stance is normal. Gait demonstrates normal stride length and balance without use of assistive device Reflexes: 1+ and symmetric. Toes downgoing.        ASSESSMENT/PLAN: Meredith Shannon is a 65 y.o. year old female with new diagnosis of moderate obstructive sleep apnea after completing  HST on 12/05/2020 followed by Dr. Rexene Alberts.    1. Moderate obstructive sleep apnea - For home use only DME continuous positive airway pressure (CPAP)  2. OSA on CPAP - For home use only DME continuous positive airway pressure (CPAP)  3. Intolerance of continuous positive airway pressure (CPAP) ventilation - For home use only DME continuous positive airway pressure (CPAP)    Difficulty tolerating full face mask due to leaks and nasal pillows due to pressure and nasal dryness - plan on retrying nasal pillows (as less leaks noted) and will decrease max pressure from 11 down to 9 and continue min pressure at 5. Advised importance of increasing nightly usage of at least greater than 4 hours nightly.  Discussed ways to help with tolerability such as using during the day for 20-30 min while watching TV or reading and increase humidity level or use of ice cube for nasal dryness. She will bring machine back in 2 weeks to pull a new download ([employer] machine - unable to obtain downloads wirelessly). She was advised to call if continued difficulty tolerating    Follow up in 6 months or call earlier if needed   CC:  [facility] provider: Dr. Rexene Alberts PCP: Aletha Halim., PA-C    I spent 34 minutes of face-to-face and non-face-to-face time with patient.  This included previsit chart review, study review, order entry, electronic health record documentation, patient education regarding new diagnosis of sleep apnea and use of CPAP, review and discussion regarding compliance report, difficulty tolerating and importance of nightly CPAP usage and answered all other questions to patient's satisfaction   Frann Rider, AGNP-BC  [facility] [address] [place], [place] [address]  Phone [phone] Fax [phone] Note: This document was prepared with digital dictation and possible smart phrase technology. Any transcriptional errors that result from this process are  unintentional.

## 2021-05-29 NOTE — Patient Instructions (Signed)
Your Plan:   -we will decrease your pressure to see if this helps with tolerating better -try to use your machine during the day for 20-30 minutes (such as if you are sitting and watching TV or reading) -try to adjust your humidity level which can help with dryness or place ice cube in water chamber -please b.ring your machine back in approx 2 week for another download with hopeful better usage and improved tolerance. If you continue to have difficulty tolerating, please let us know   Follow up in 6 months or call earlier if needed     Thank you for coming to see [place] at [facility]. I hope we have been able to provide you high quality care today.  You may receive a patient satisfaction survey over the next few weeks. We would appreciate your feedback and comments so that we may continue to improve ourselves and the health of our patients.

## 2021-05-30 NOTE — Progress Notes (Signed)
Order for cpap sent to [facility].

## 2021-06-04 ENCOUNTER — Other Ambulatory Visit: Payer: Self-pay

## 2021-06-04 ENCOUNTER — Ambulatory Visit (INDEPENDENT_AMBULATORY_CARE_PROVIDER_SITE_OTHER): Payer: BC Managed Care – PPO

## 2021-06-04 ENCOUNTER — Ambulatory Visit: Payer: BC Managed Care – PPO | Admitting: Orthopedic Surgery

## 2021-06-04 DIAGNOSIS — S52572A Other intraarticular fracture of lower end of left radius, initial encounter for closed fracture: Secondary | ICD-10-CM

## 2021-06-04 NOTE — Progress Notes (Signed)
Office Visit Note   Patient: Meredith Shannon           Date of Birth: [birth year]           MRN: [id number] Visit Date: 06/04/2021              Requested by: Aletha Halim., PA-C [address]  [place] [address] PCP: Aletha Halim., PA-C   Assessment & Plan: Visit Diagnoses:  1. Other closed intra-articular fracture of distal end of left radius, initial encounter     Plan:  Patients finger swelling and ROM is much improved from her previous visit as is her wrist pain.  She is 8 weeks out from surgery this week.  We will plan on removal of the plate next week or the following and will start hand therapy to work on wrist ROM.  Risks of plate removal were discussed including infection and risk of fracture through the screw holes.  She will be immobilized for a short time after plate removal to mitigate this risk.  She understands and is anxious to get the plate out and start therapy.   Follow-Up Instructions: No follow-ups on file.   Orders:  Orders Placed This Encounter  Procedures   XR Wrist Complete Left   No orders of the defined types were placed in this encounter.     Procedures: No procedures performed   Clinical Data: No additional findings.   Subjective: Chief Complaint  Patient presents with   Left Wrist - Fracture, Follow-up    Says that it is still painful with any activity, and it is still swollen    This is a 65 yo RHD F who presents for follow up of a L distal radius fx s/p ORIF approximately 8 weeks ago.  She had displacement of the radial styloid fragment postoperatively.  We have been treating her in a short arm cast to let the fracture heal before the plate is removed.  She notes that her pain is improved.  She is sleeping much better.  Her fingers are much less swollen and with improved ROM.  She is ready to get the plate out and start hand therapy.    Review of Systems   Objective: Vital Signs: LMP 08/04/2006   Physical  Exam Constitutional:      Appearance: Normal appearance.  Cardiovascular:     Rate and Rhythm: Normal rate.     Pulses: Normal pulses.  Pulmonary:     Effort: Pulmonary effort is normal.  Skin:    General: Skin is warm and dry.     Capillary Refill: Capillary refill takes less than 2 seconds.  Neurological:     Mental Status: She is alert.    Left Hand Exam   Other  Sensation: normal Pulse: present  Comments:  Finger swelling much improved.  Full finger ROM.  Fingers warm and well perfused.      Specialty Comments:  No specialty comments available.  Imaging: 3V of the L wrist taken today are reviewed and interpreted by me.  There appears to be interval healing of the fracture without change in fracture alignment.    PMFS History: Patient Active Problem List   Diagnosis Date Noted   H/O: hysterectomy 04/14/2021   Distal radius fracture, left 04/09/2021   OSA (obstructive sleep apnea) 12/11/2020   Polyphagia 10/22/2020   Secondary erythrocytosis 09/14/2020   Class 1 obesity due to excess calories with body mass index (BMI) of 30.0 to  30.9 in adult 08/22/2020   Acid reflux 08/22/2020   Dysphagia 08/22/2020   Globus sensation 05/18/2018   Chest pain with moderate risk for cardiac etiology - concern for coronary spasm 08/26/2016   Dyspareunia, female 00/76/2263   Lichen simplex chronicus 06/01/2015   Hyperlipidemia 06/01/2015   Other and unspecified hyperlipidemia 04/14/2014   Past Medical History:  Diagnosis Date   Anxiety    Broken wrist 2008   Cancer [facility])    basal cell   Chest pain 2018   seen by Dr. Ellyn Hack.  Has mid LAD stenosis.   Chest pain    Constipation    GERD (gastroesophageal reflux disease)    occasional uses tums   High cholesterol    In total cholesterol and triglycerides.   High triglycerides    HSV-1 infection    Joint pain    Lichen simplex chronicus    Sleep apnea    Swallowing difficulty    Vitamin D deficiency     Family  History  Problem Relation Age of Onset   Osteoporosis Mother    High blood pressure Mother    High Cholesterol Mother    Depression Mother    Sudden death Father    Alcohol abuse Father     Past Surgical History:  Procedure Laterality Date   BREAST BIOPSY  5/12   fibrocystic changes   CYSTOSCOPY N/A 03/09/2017   Procedure: CYSTOSCOPY;  Surgeon: Megan Salon, MD;  Location: [facility];  Service: Gynecology;  Laterality: N/A;   DILATATION & CURETTAGE/HYSTEROSCOPY WITH MYOSURE N/A 12/26/2016   Procedure: [place];  Surgeon: Megan Salon, MD;  Location: [facility];  Service: Gynecology;  Laterality: N/A;   DILATION AND CURETTAGE OF UTERUS     "years ago"   FACIAL COSMETIC SURGERY     HAND SURGERY Right 2012   fractured bone in hands   LAPAROSCOPIC HYSTERECTOMY N/A 03/09/2017   Procedure: HYSTERECTOMY TOTAL LAPAROSCOPIC;  Surgeon: Megan Salon, MD;  Location: [facility];  Service: Gynecology;  Laterality: N/A;   LAPAROSCOPIC SALPINGO OOPHERECTOMY Bilateral 03/09/2017   Procedure: LAPAROSCOPIC SALPINGO OOPHORECTOMY;  Surgeon: Megan Salon, MD;  Location[facility];  Service: Gynecology;  Laterality: Bilateral;   OPEN REDUCTION INTERNAL FIXATION (ORIF) DISTAL RADIAL FRACTURE Left 04/12/2021   Procedure: OPEN REDUCTION INTERNAL FIXATION (ORIF) LEFT DISTAL RADIUS FRACTURE;  Surgeon: Sherilyn Cooter, MD;  Location: [place];  Service: Orthopedics;  Laterality: Left;   TUBAL LIGATION  1983   WRIST FRACTURE SURGERY Right 2008   Social History   Occupational History   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

## 2021-06-04 NOTE — H&P (View-Only) (Signed)
Office Visit Note   Patient: Meredith Shannon           Date of Birth: Aug 06, 1955           MRN: [id number] Visit Date: 06/04/2021              Requested by: Aletha Halim., PA-C [address],  [place] [address] PCP: Aletha Halim., PA-C   Assessment & Plan: Visit Diagnoses:  1. Other closed intra-articular fracture of distal end of left radius, initial encounter     Plan:  Patients finger swelling and ROM is much improved from her previous visit as is her wrist pain.  She is 8 weeks out from surgery this week.  We will plan on removal of the plate next week or the following and will start hand therapy to work on wrist ROM.  Risks of plate removal were discussed including infection and risk of fracture through the screw holes.  She will be immobilized for a short time after plate removal to mitigate this risk.  She understands and is anxious to get the plate out and start therapy.   Follow-Up Instructions: No follow-ups on file.   Orders:  Orders Placed This Encounter  Procedures   XR Wrist Complete Left   No orders of the defined types were placed in this encounter.     Procedures: No procedures performed   Clinical Data: No additional findings.   Subjective: Chief Complaint  Patient presents with   Left Wrist - Fracture, Follow-up    Says that it is still painful with any activity, and it is still swollen    This is a 65 yo RHD F who presents for follow up of a L distal radius fx s/p ORIF approximately 8 weeks ago.  She had displacement of the radial styloid fragment postoperatively.  We have been treating her in a short arm cast to let the fracture heal before the plate is removed.  She notes that her pain is improved.  She is sleeping much better.  Her fingers are much less swollen and with improved ROM.  She is ready to get the plate out and start hand therapy.    Review of Systems   Objective: Vital Signs: LMP 08/04/2006   Physical  Exam Constitutional:      Appearance: Normal appearance.  Cardiovascular:     Rate and Rhythm: Normal rate.     Pulses: Normal pulses.  Pulmonary:     Effort: Pulmonary effort is normal.  Skin:    General: Skin is warm and dry.     Capillary Refill: Capillary refill takes less than 2 seconds.  Neurological:     Mental Status: She is alert.    Left Hand Exam   Other  Sensation: normal Pulse: present  Comments:  Finger swelling much improved.  Full finger ROM.  Fingers warm and well perfused.      Specialty Comments:  No specialty comments available.  Imaging: 3V of the L wrist taken today are reviewed and interpreted by me.  There appears to be interval healing of the fracture without change in fracture alignment.    PMFS History: Patient Active Problem List   Diagnosis Date Noted   H/O: hysterectomy 04/14/2021   Distal radius fracture, left 04/09/2021   OSA (obstructive sleep apnea) 12/11/2020   Polyphagia 10/22/2020   Secondary erythrocytosis 09/14/2020   Class 1 obesity due to excess calories with body mass index (BMI) of 30.0 to  30.9 in adult 08/22/2020   Acid reflux 08/22/2020   Dysphagia 08/22/2020   Globus sensation 05/18/2018   Chest pain with moderate risk for cardiac etiology - concern for coronary spasm 08/26/2016   Dyspareunia, female 16/05/9603   Lichen simplex chronicus 06/01/2015   Hyperlipidemia 06/01/2015   Other and unspecified hyperlipidemia 04/14/2014   Past Medical History:  Diagnosis Date   Anxiety    Broken wrist 2008   Cancer [facility])    basal cell   Chest pain 2018   seen by Dr. Ellyn Hack.  Has mid LAD stenosis.   Chest pain    Constipation    GERD (gastroesophageal reflux disease)    occasional uses tums   High cholesterol    In total cholesterol and triglycerides.   High triglycerides    HSV-1 infection    Joint pain    Lichen simplex chronicus    Sleep apnea    Swallowing difficulty    Vitamin D deficiency     Family  History  Problem Relation Age of Onset   Osteoporosis Mother    High blood pressure Mother    High Cholesterol Mother    Depression Mother    Sudden death Father    Alcohol abuse Father     Past Surgical History:  Procedure Laterality Date   BREAST BIOPSY  5/12   fibrocystic changes   CYSTOSCOPY N/A 03/09/2017   Procedure: CYSTOSCOPY;  Surgeon: Megan Salon, MD;  Location: [facility];  Service: Gynecology;  Laterality: N/A;   DILATATION & CURETTAGE/HYSTEROSCOPY WITH MYOSURE N/A 12/26/2016   Procedure: [place];  Surgeon: Megan Salon, MD;  Location: [facility];  Service: Gynecology;  Laterality: N/A;   DILATION AND CURETTAGE OF UTERUS     "years ago"   FACIAL COSMETIC SURGERY     HAND SURGERY Right 2012   fractured bone in hands   LAPAROSCOPIC HYSTERECTOMY N/A 03/09/2017   Procedure: HYSTERECTOMY TOTAL LAPAROSCOPIC;  Surgeon: Megan Salon, MD;  Location: [facility];  Service: Gynecology;  Laterality: N/A;   LAPAROSCOPIC SALPINGO OOPHERECTOMY Bilateral 03/09/2017   Procedure: LAPAROSCOPIC SALPINGO OOPHORECTOMY;  Surgeon: Megan Salon, MD;  Location: [facility];  Service: Gynecology;  Laterality: Bilateral;   OPEN REDUCTION INTERNAL FIXATION (ORIF) DISTAL RADIAL FRACTURE Left 04/12/2021   Procedure: OPEN REDUCTION INTERNAL FIXATION (ORIF) LEFT DISTAL RADIUS FRACTURE;  Surgeon: Sherilyn Cooter, MD;  Location: [place];  Service: Orthopedics;  Laterality: Left;   TUBAL LIGATION  1983   WRIST FRACTURE SURGERY Right 2008   Social History   Occupational History   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

## 2021-06-06 ENCOUNTER — Other Ambulatory Visit: Payer: Self-pay

## 2021-06-06 ENCOUNTER — Encounter (HOSPITAL_BASED_OUTPATIENT_CLINIC_OR_DEPARTMENT_OTHER): Payer: Self-pay | Admitting: Orthopedic Surgery

## 2021-06-06 NOTE — Progress Notes (Signed)
Chart reviewed  by Dr. Sabra Heck, anesthesiologist and okay to continue with procedure at [facility] as planned even though patient has not followed up with cardiology. Patient denies any chest pains for over 3 years now.

## 2021-06-11 DIAGNOSIS — R509 Fever, unspecified: Secondary | ICD-10-CM | POA: Diagnosis not present

## 2021-06-11 DIAGNOSIS — J101 Influenza due to other identified influenza virus with other respiratory manifestations: Secondary | ICD-10-CM | POA: Diagnosis not present

## 2021-06-11 DIAGNOSIS — R059 Cough, unspecified: Secondary | ICD-10-CM | POA: Diagnosis not present

## 2021-06-17 ENCOUNTER — Telehealth: Payer: Self-pay | Admitting: Orthopedic Surgery

## 2021-06-17 ENCOUNTER — Encounter (HOSPITAL_BASED_OUTPATIENT_CLINIC_OR_DEPARTMENT_OTHER): Payer: Self-pay | Admitting: Orthopedic Surgery

## 2021-06-17 NOTE — Telephone Encounter (Signed)
Pt calling in regards to resch her previous surg from last week with Dr. Tempie Donning. The best call back number is [phone],

## 2021-06-17 NOTE — Anesthesia Preprocedure Evaluation (Addendum)
Anesthesia Evaluation  Patient identified by MRN, date of birth, ID band Patient awake    Reviewed: Allergy & Precautions, NPO status , Patient's Chart, lab work & pertinent test results  Airway Mallampati: II  TM Distance: >3 FB Neck ROM: Full    Dental no notable dental hx. (+) Teeth Intact, Dental Advisory Given   Pulmonary sleep apnea , former smoker,    Pulmonary exam normal breath sounds clear to auscultation       Cardiovascular Exercise Tolerance: Good negative cardio ROS Normal cardiovascular exam Rhythm:Regular Rate:Normal     Neuro/Psych Anxiety negative neurological ROS     GI/Hepatic Neg liver ROS, GERD  Medicated and Controlled,  Endo/Other  negative endocrine ROS  Renal/GU negative Renal ROS     Musculoskeletal negative musculoskeletal ROS (+)   Abdominal   Peds  Hematology   Anesthesia Other Findings ALL : PCN  Reproductive/Obstetrics                           Anesthesia Physical Anesthesia Plan  ASA: 2  Anesthesia Plan: Regional   Post-op Pain Management:    Induction: Intravenous  PONV Risk Score and Plan: 3 and Treatment may vary due to age or medical condition, Midazolam and Ondansetron  Airway Management Planned: Natural Airway and Nasal Cannula  Additional Equipment: None  Intra-op Plan:   Post-operative Plan:   Informed Consent: I have reviewed the patients History and Physical, chart, labs and discussed the procedure including the risks, benefits and alternatives for the proposed anesthesia with the patient or authorized representative who has indicated his/her understanding and acceptance.     Dental advisory given  Plan Discussed with: CRNA and Anesthesiologist  Anesthesia Plan Comments: (L supraclavicular Block plus MAC)      Anesthesia Quick Evaluation

## 2021-06-18 ENCOUNTER — Ambulatory Visit (HOSPITAL_BASED_OUTPATIENT_CLINIC_OR_DEPARTMENT_OTHER): Payer: Self-pay

## 2021-06-18 ENCOUNTER — Encounter (HOSPITAL_BASED_OUTPATIENT_CLINIC_OR_DEPARTMENT_OTHER)
Admission: RE | Disposition: A | Discharge status: Home / Self Care | Payer: Self-pay | Source: Ambulatory Visit | Attending: Orthopedic Surgery

## 2021-06-18 ENCOUNTER — Other Ambulatory Visit: Payer: Self-pay

## 2021-06-18 ENCOUNTER — Ambulatory Visit (HOSPITAL_BASED_OUTPATIENT_CLINIC_OR_DEPARTMENT_OTHER): Payer: Self-pay | Admitting: Anesthesiology

## 2021-06-18 ENCOUNTER — Encounter (HOSPITAL_BASED_OUTPATIENT_CLINIC_OR_DEPARTMENT_OTHER): Payer: Self-pay | Admitting: Orthopedic Surgery

## 2021-06-18 ENCOUNTER — Ambulatory Visit (HOSPITAL_BASED_OUTPATIENT_CLINIC_OR_DEPARTMENT_OTHER)
Admission: RE | Admit: 2021-06-18 | Discharge: 2021-06-18 | Disposition: A | Discharge status: Home / Self Care | Payer: BC Managed Care – PPO | Source: Ambulatory Visit | Attending: Orthopedic Surgery | Admitting: Orthopedic Surgery

## 2021-06-18 ENCOUNTER — Encounter (HOSPITAL_BASED_OUTPATIENT_CLINIC_OR_DEPARTMENT_OTHER): Payer: Self-pay | Admitting: Anesthesiology

## 2021-06-18 DIAGNOSIS — Y793 Surgical instruments, materials and orthopedic devices (including sutures) associated with adverse incidents: Secondary | ICD-10-CM | POA: Insufficient documentation

## 2021-06-18 DIAGNOSIS — S52572A Other intraarticular fracture of lower end of left radius, initial encounter for closed fracture: Secondary | ICD-10-CM | POA: Diagnosis not present

## 2021-06-18 DIAGNOSIS — T84193A Other mechanical complication of internal fixation device of bone of left forearm, initial encounter: Secondary | ICD-10-CM | POA: Diagnosis not present

## 2021-06-18 DIAGNOSIS — Z87891 Personal history of nicotine dependence: Secondary | ICD-10-CM | POA: Insufficient documentation

## 2021-06-18 DIAGNOSIS — K219 Gastro-esophageal reflux disease without esophagitis: Secondary | ICD-10-CM | POA: Diagnosis not present

## 2021-06-18 DIAGNOSIS — G4733 Obstructive sleep apnea (adult) (pediatric): Secondary | ICD-10-CM | POA: Diagnosis not present

## 2021-06-18 DIAGNOSIS — G4730 Sleep apnea, unspecified: Secondary | ICD-10-CM | POA: Insufficient documentation

## 2021-06-18 DIAGNOSIS — E559 Vitamin D deficiency, unspecified: Secondary | ICD-10-CM | POA: Diagnosis not present

## 2021-06-18 DIAGNOSIS — T8484XA Pain due to internal orthopedic prosthetic devices, implants and grafts, initial encounter: Secondary | ICD-10-CM | POA: Diagnosis not present

## 2021-06-18 DIAGNOSIS — E7800 Pure hypercholesterolemia, unspecified: Secondary | ICD-10-CM | POA: Diagnosis not present

## 2021-06-18 HISTORY — PX: HARDWARE REMOVAL: SHX979

## 2021-06-18 SURGERY — REMOVAL, HARDWARE
Anesthesia: Regional | Site: Wrist | Laterality: Left

## 2021-06-18 MED ORDER — BUPIVACAINE-EPINEPHRINE (PF) 0.5% -1:200000 IJ SOLN
INTRAMUSCULAR | Status: DC | PRN
  Administered 2021-06-18: 30 mL via PERINEURAL

## 2021-06-18 MED ORDER — OXYCODONE HCL 5 MG PO TABS: 5.0000 mg | ORAL_TABLET | ORAL | 0 refills | Status: AC | PRN

## 2021-06-18 MED ORDER — PROPOFOL 500 MG/50ML IV EMUL
INTRAVENOUS | Status: DC | PRN
  Administered 2021-06-18: 100 ug/kg/min via INTRAVENOUS

## 2021-06-18 MED ORDER — ONDANSETRON HCL 4 MG/2ML IJ SOLN: 4.0000 mg | Freq: Once | INTRAMUSCULAR | Status: DC | PRN

## 2021-06-18 MED ORDER — FENTANYL CITRATE (PF) 100 MCG/2ML IJ SOLN
100.0000 ug | Freq: Once | INTRAMUSCULAR | Status: AC
  Administered 2021-06-18: 100 ug via INTRAVENOUS

## 2021-06-18 MED ORDER — ONDANSETRON HCL 4 MG/2ML IJ SOLN
INTRAMUSCULAR | Status: DC | PRN
  Administered 2021-06-18: 4 mg via INTRAVENOUS

## 2021-06-18 MED ORDER — FENTANYL CITRATE (PF) 100 MCG/2ML IJ SOLN
INTRAMUSCULAR | Status: AC
  Filled 2021-06-18: qty 2

## 2021-06-18 MED ORDER — LACTATED RINGERS IV SOLN: INTRAVENOUS | Status: DC

## 2021-06-18 MED ORDER — CLINDAMYCIN PHOSPHATE 900 MG/50ML IV SOLN
INTRAVENOUS | Status: AC
  Filled 2021-06-18: qty 50

## 2021-06-18 MED ORDER — MIDAZOLAM HCL 2 MG/2ML IJ SOLN
2.0000 mg | Freq: Once | INTRAMUSCULAR | Status: AC
  Administered 2021-06-18: 2 mg via INTRAVENOUS

## 2021-06-18 MED ORDER — AMISULPRIDE (ANTIEMETIC) 5 MG/2ML IV SOLN: 10.0000 mg | Freq: Once | INTRAVENOUS | Status: DC | PRN

## 2021-06-18 MED ORDER — CLINDAMYCIN PHOSPHATE 900 MG/50ML IV SOLN
900.0000 mg | INTRAVENOUS | Status: AC
  Administered 2021-06-18: 900 mg via INTRAVENOUS

## 2021-06-18 MED ORDER — ONDANSETRON HCL 4 MG/2ML IJ SOLN
INTRAMUSCULAR | Status: AC
  Filled 2021-06-18: qty 4

## 2021-06-18 MED ORDER — MIDAZOLAM HCL 2 MG/2ML IJ SOLN
INTRAMUSCULAR | Status: AC
  Filled 2021-06-18: qty 2

## 2021-06-18 MED ORDER — MIDAZOLAM HCL 2 MG/2ML IJ SOLN
INTRAMUSCULAR | Status: DC | PRN
  Administered 2021-06-18: 2 mg via INTRAVENOUS

## 2021-06-18 MED ORDER — OXYCODONE HCL 5 MG PO TABS
ORAL_TABLET | ORAL | Status: AC
  Filled 2021-06-18: qty 1

## 2021-06-18 MED ORDER — 0.9 % SODIUM CHLORIDE (POUR BTL) OPTIME
TOPICAL | Status: DC | PRN
  Administered 2021-06-18: 200 mL

## 2021-06-18 MED ORDER — OXYCODONE HCL 5 MG PO TABS
5.0000 mg | ORAL_TABLET | Freq: Once | ORAL | Status: AC | PRN
  Administered 2021-06-18: 5 mg via ORAL

## 2021-06-18 MED ORDER — ACETAMINOPHEN 10 MG/ML IV SOLN: 1000.0000 mg | Freq: Once | INTRAVENOUS | Status: DC | PRN

## 2021-06-18 MED ORDER — FENTANYL CITRATE (PF) 100 MCG/2ML IJ SOLN
INTRAMUSCULAR | Status: DC | PRN
  Administered 2021-06-18 (×2): 25 ug via INTRAVENOUS
  Administered 2021-06-18: 50 ug via INTRAVENOUS

## 2021-06-18 MED ORDER — HYDROMORPHONE HCL 1 MG/ML IJ SOLN: 0.2500 mg | INTRAMUSCULAR | Status: DC | PRN

## 2021-06-18 MED ORDER — PROPOFOL 10 MG/ML IV BOLUS
INTRAVENOUS | Status: DC | PRN
  Administered 2021-06-18: 30 mg via INTRAVENOUS
  Administered 2021-06-18: 20 mg via INTRAVENOUS

## 2021-06-18 MED ORDER — OXYCODONE HCL 5 MG/5ML PO SOLN: 5.0000 mg | Freq: Once | ORAL | Status: AC | PRN

## 2021-06-18 SURGICAL SUPPLY — 33 items
APL PRP STRL LF DISP 70% ISPRP (MISCELLANEOUS) ×1
BLADE SURG 15 STRL LF DISP TIS (BLADE) ×1 IMPLANT
BLADE SURG 15 STRL SS (BLADE) ×3
BNDG CMPR 9X4 STRL LF SNTH (GAUZE/BANDAGES/DRESSINGS) ×1
BNDG ELASTIC 3X5.8 VLCR STR LF (GAUZE/BANDAGES/DRESSINGS) ×3 IMPLANT
BNDG ESMARK 4X9 LF (GAUZE/BANDAGES/DRESSINGS) ×3 IMPLANT
BNDG GAUZE ELAST 4 BULKY (GAUZE/BANDAGES/DRESSINGS) ×3 IMPLANT
BNDG PLASTER X FAST 3X3 WHT LF (CAST SUPPLIES) ×12 IMPLANT
BNDG PLSTR 9X3 FST ST WHT (CAST SUPPLIES) ×1
CHLORAPREP W/TINT 26 (MISCELLANEOUS) ×3 IMPLANT
CORD BIPOLAR FORCEPS 12FT (ELECTRODE) ×3 IMPLANT
COVER BACK TABLE 60X90IN (DRAPES) ×3 IMPLANT
COVER MAYO STAND STRL (DRAPES) ×3 IMPLANT
DRAPE EXTREMITY T 121X128X90 (DISPOSABLE) ×3 IMPLANT
DRAPE OEC MINIVIEW 54X84 (DRAPES) ×3 IMPLANT
GAUZE SPONGE 4X4 12PLY STRL (GAUZE/BANDAGES/DRESSINGS) ×3 IMPLANT
GAUZE XEROFORM 1X8 LF (GAUZE/BANDAGES/DRESSINGS) ×2 IMPLANT
GLOVE SURG ENC MOIS LTX SZ7 (GLOVE) ×3 IMPLANT
GOWN STRL REUS W/ TWL LRG LVL3 (GOWN DISPOSABLE) ×1 IMPLANT
GOWN STRL REUS W/ TWL XL LVL3 (GOWN DISPOSABLE) ×1 IMPLANT
GOWN STRL REUS W/TWL LRG LVL3 (GOWN DISPOSABLE) ×3
GOWN STRL REUS W/TWL XL LVL3 (GOWN DISPOSABLE) ×3
NS IRRIG 1000ML POUR BTL (IV SOLUTION) ×3 IMPLANT
PACK BASIN DAY SURGERY FS (CUSTOM PROCEDURE TRAY) ×3 IMPLANT
PAD CAST 3X4 CTTN HI CHSV (CAST SUPPLIES) ×1 IMPLANT
PADDING CAST COTTON 3X4 STRL (CAST SUPPLIES) ×6
SLING ARM FOAM STRAP LRG (SOFTGOODS) ×2 IMPLANT
STOCKINETTE 4X48 STRL (DRAPES) ×3 IMPLANT
SUT ETHILON 4 0 PS 2 18 (SUTURE) ×3 IMPLANT
SUT MNCRL AB 3-0 PS2 18 (SUTURE) ×3 IMPLANT
SYR BULB EAR ULCER 3OZ GRN STR (SYRINGE) ×3 IMPLANT
TOWEL GREEN STERILE FF (TOWEL DISPOSABLE) ×6 IMPLANT
UNDERPAD 30X36 HEAVY ABSORB (UNDERPADS AND DIAPERS) ×3 IMPLANT

## 2021-06-18 NOTE — Anesthesia Postprocedure Evaluation (Signed)
Anesthesia Post Note  Patient: Meredith Shannon  Procedure(s) Performed: REMOVAL OF HARDWARE LEFT DISTAL RADIUS FRACTURE (Left: Wrist)     Patient location during evaluation: PACU Anesthesia Type: Regional Level of consciousness: awake and alert Pain management: pain level controlled Vital Signs Assessment: post-procedure vital signs reviewed and stable Respiratory status: spontaneous breathing, nonlabored ventilation, respiratory function stable and patient connected to nasal cannula oxygen Cardiovascular status: stable and blood pressure returned to baseline Postop Assessment: no apparent nausea or vomiting Anesthetic complications: no   No notable events documented.  Last Vitals:  Vitals:   06/18/21 1400 06/18/21 1600  BP: 139/74 131/74  Pulse: 77 74  Resp: 14 18  Temp:  36.6 C  SpO2: 98% 96%    Last Pain:  Vitals:   06/18/21 1600  TempSrc:   PainSc: 4                  Barnet Glasgow

## 2021-06-18 NOTE — Op Note (Signed)
Date of Surgery: 06/18/2021  INDICATIONS: Meredith Shannon is a 65 y.o.-year-old female with left distal radius fracture that occurred approximately 8 weeks ago.  A postop CT scan of the wrist demonstrated slight intra-articular penetration of one of the distal locking screws prompting removal of hardware.  Risks, benefits, and alternatives to surgery were again discussed with the patient wishing to proceed with surgery.  Informed consent was signed after our discussion.   PREOPERATIVE DIAGNOSIS:  Symptomatic hardware of left wrist  POSTOPERATIVE DIAGNOSIS: Same.  PROCEDURE:  Removal of left volar distal radius plate   SURGEON: Audria Nine, M.D.  ASSIST:   ANESTHESIA:  Regional  IV FLUIDS AND URINE: See anesthesia.  ESTIMATED BLOOD LOSS: 10 mL.  IMPLANTS:  Implant Name Type Inv. Item Serial No. Manufacturer Lot No. LRB No. Used Action  LOG [id number] - [employer] BI[employer]ET CROSSLOCK DVR SET - 1          PLATE DVR VOLAR RIM NRW L ST - [id number] Plate PLATE DVR VOLAR RIM NRW L ST  [employer](ORTH,TRAU,BIO,SG) [id number] Left 1 Explanted  SCREW LOCKING 2.7X14 - [id number] Screw SCREW LOCKING 2.7X14  [employer](ORTH,TRAU,BIO,SG) ON STERILE TRAY Left 4 Explanted  SCREW LOCKING 2.7X16 - [id number] Screw SCREW LOCKING 2.7X16  [employer](ORTH,TRAU,BIO,SG) ON STERILE TRAY Left 1 Explanted  SCREW LOCKING 2.7X18 - [id number] Screw SCREW LOCKING 2.7X18  [employer](ORTH,TRAU,BIO,SG) ON STERILE TRAY Left 1 Explanted  SCREW MULTI DIRECTIONAL 2.7X14 - [id number] Screw SCREW MULTI DIRECTIONAL 2.7X14  [employer](ORTH,TRAU,BIO,SG) ON STERILE TRAY Left 1 Explanted  SCREW MULTI DIRECTIONAL 2.7X18 - [id number] Screw SCREW MULTI DIRECTIONAL 2.7X18  [employer](ORTH,TRAU,BIO,SG) ON STERILE TRAY Left 2 Explanted  SCREW MULTI DIRECTIONAL 2.7X20 - [id number] Screw SCREW MULTI DIRECTIONAL 2.7X20  [employer](ORTH,TRAU,BIO,SG) ON STERILE TRAY Left 1 Explanted  SCREW MULTI DIRECTIONAL 2.7X22 - [id number] Screw SCREW MULTI  DIRECTIONAL 2.7X22  [employer](ORTH,TRAU,BIO,SG) ON STERILE TRAY Left 1 Explanted     DRAINS: None  COMPLICATIONS: see description of procedure.  DESCRIPTION OF PROCEDURE: The patient was met in the preoperative holding area where the surgical site was marked and the consent form was verified.  The patient was then taken to the operating room and transferred to the operating table.  All bony prominences were well padded.  A tourniquet was applied to the left upper arm.  The operative extremity was prepped and draped in the usual and sterile fashion.  A formal time-out was performed to confirm that this was the correct patient, surgery, side, and site.   Following timeout, the limb was exsanguinated and the tourniquet inflated to 250 mmHg.  A longitudinal incision was made following her previous incision.  The FCR tendon was identified and retracted ulnarly.  The floor of the FCR sheath was incised.  The FPL tendon was identified immediately deep to this.  The pronator quadratus was unable to be repaired at her index surgery given the significant soft tissue injury present.  The plate was identified and all scar tissue elevated from the surface of the plate.  All screws were removed uneventfully. A freer elevator was used to elevate the plate from the underlying bone.  The plate was removed.  Fluoroscopic imaging demonstrated complete hardware removal.  The wound was thoroughly irrigated.  The tourniquet was deflated and hemostasis achieved using direct pressure and bipolar electrocautery.  The skin was closed with 4-0 monocryl in a buried fashion followed by 4-0 nylon.  The wound was dressed with xeroform, folded kerlix, cast padding, and a 4 inch  splint.  The patient was then reversed from sedation and transferred to the postoperative bed.  All counts were correct x 2 at the end of the procedure.   POSTOPERATIVE PLAN: She will be discharged to home with appropriate pain medication and discharge  instructions.  She will start hand therapy as soon as possible.   Audria Nine, MD 4:14 PM

## 2021-06-18 NOTE — Interval H&P Note (Signed)
History and Physical Interval Note:  06/18/2021 4:13 PM  Meredith Shannon  has presented today for surgery, with the diagnosis of Removal of Hardware Left Wrist.  The various methods of treatment have been discussed with the patient and family. After consideration of risks, benefits and other options for treatment, the patient has consented to  Procedure(s): REMOVAL OF HARDWARE LEFT DISTAL RADIUS FRACTURE (Left) as a surgical intervention.  The patient's history has been reviewed, patient examined, no change in status, stable for surgery.  I have reviewed the patient's chart and labs.  Questions were answered to the patient's satisfaction.     Meredith Shannon

## 2021-06-18 NOTE — Discharge Instructions (Addendum)
Post Anesthesia Home Care Instructions  Activity: Get plenty of rest for the remainder of the day. A responsible individual must stay with you for 24 hours following the procedure.  For the next 24 hours, DO NOT: -Drive a car -Paediatric nurse -Drink alcoholic beverages -Take any medication unless instructed by your physician -Make any legal decisions or sign important papers.  Meals: Start with liquid foods such as gelatin or soup. Progress to regular foods as tolerated. Avoid greasy, spicy, heavy foods. If nausea and/or vomiting occur, drink only clear liquids until the nausea and/or vomiting subsides. Call your physician if vomiting continues.  Special Instructions/Symptoms: Your throat may feel dry or sore from the anesthesia or the breathing tube placed in your throat during surgery. If this causes discomfort, gargle with warm salt water. The discomfort should disappear within 24 hours.  If you had a scopolamine patch placed behind your ear for the management of post- operative nausea and/or vomiting:  1. The medication in the patch is effective for 72 hours, after which it should be removed.  Wrap patch in a tissue and discard in the trash. Wash hands thoroughly with soap and water. 2. You may remove the patch earlier than 72 hours if you experience unpleasant side effects which may include dry mouth, dizziness or visual disturbances. 3. Avoid touching the patch. Wash your hands with soap and water after contact with the patch.    Regional Anesthesia Blocks  1. Numbness or the inability to move the "blocked" extremity may last from 3-48 hours after placement. The length of time depends on the medication injected and your individual response to the medication. If the numbness is not going away after 48 hours, call your surgeon.  2. The extremity that is blocked will need to be protected until the numbness is gone and the  Strength has returned. Because you cannot feel it, you will  need to take extra care to avoid injury. Because it may be weak, you may have difficulty moving it or using it. You may not know what position it is in without looking at it while the block is in effect.  3. For blocks in the legs and feet, returning to weight bearing and walking needs to be done carefully. You will need to wait until the numbness is entirely gone and the strength has returned. You should be able to move your leg and foot normally before you try and bear weight or walk. You will need someone to be with you when you first try to ensure you do not fall and possibly risk injury.  4. Bruising and tenderness at the needle site are common side effects and will resolve in a few days.  5. Persistent numbness or new problems with movement should be communicated to the surgeon or the [place] [phone] [place] [phone]).      Audria Nine, M.D. Hand Surgery  POST-OPERATIVE DISCHARGE INSTRUCTIONS   PRESCRIPTIONS: You have been given a prescription to be taken as directed for post-operative pain control.  You may also take over the counter ibuprofen/aleve and tylenol for pain. Take this as directed on the packaging. Do not exceed 3000 mg tylenol/acetaminophen in 24 hours.  Ibuprofen 600-800 mg (3-4) tablets by mouth every 6 hours as needed for pain.  OR Aleve 2 tablets by mouth every 12 hours (twice daily) as needed for pain.  AND/OR Tylenol 1000 mg (2 tablets) every 8 hours as needed for pain.  Please use  your pain medication carefully, as refills are limited and you may not be provided with one.  As stated above, please use over the counter pain medicine - it will also be helpful with decreasing your swelling.    ANESTHESIA: After your surgery, post-surgical discomfort or pain is likely. This discomfort can last several days to a few weeks. At certain times of the day your discomfort may be more intense.   Did you receive a nerve  block?  A nerve block can provide pain relief for one hour to two days after your surgery. As long as the nerve block is working, you will experience little or no sensation in the area the surgeon operated on.  As the nerve block wears off, you will begin to experience pain or discomfort. It is very important that you begin taking your prescribed pain medication before the nerve block fully wears off. Treating your pain at the first sign of the block wearing off will ensure your pain is better controlled and more tolerable when full-sensation returns. Do not wait until the pain is intolerable, as the medicine will be less effective. It is better to treat pain in advance than to try and catch up.   General Anesthesia:  If you did not receive a nerve block during your surgery, you will need to start taking your pain medication shortly after your surgery and should continue to do so as prescribed by your surgeon.     ICE AND ELEVATION: You may use ice for the first 48-72 hours, but it is not critical.   Motion of your fingers is very important s to decrease the swelling. Elevation, as much as possible for the next 48 hours, is critical for decreasing swelling as well as for pain relief. Elevation means when you are seated or lying down, you hand should be at or above your heart. When walking, the hand needs to be at or above the level of your elbow.  If the bandage gets too tight, it may need to be loosened. Please contact our office and we will instruct you in how to do this.    SURGICAL BANDAGES:  Keep your dressing and/or splint clean and dry at all times.  Do not remove until you are seen again in the office.  If careful, you may place a plastic bag over your bandage and tape the end to shower, but be careful, do not get your bandages wet.     HAND THERAPY:  You may not need any. If you do, we will begin this at your follow up visit in the clinic.    ACTIVITY AND WORK: You are encouraged to  move any fingers which are not in the bandage. Light use of the fingers is allowed to assist the other hand with daily hygiene and eating, but strong gripping or lifting is often uncomfortable and should be avoided.  You might miss a variable period of time from work and hopefully this issue has been discussed prior to surgery. You may not do any heavy work with your affected hand for about 2 weeks.    [facility] [address] [place],  [place]  [address] [phone]  No oxycodone until 8:10 pm

## 2021-06-18 NOTE — Progress Notes (Signed)
Assisted Dr. Houser with left, ultrasound guided, supraclavicular block. Side rails up, monitors on throughout procedure. See vital signs in flow sheet. Tolerated Procedure well. °

## 2021-06-18 NOTE — Transfer of Care (Signed)
Immediate Anesthesia Transfer of Care Note  Patient: Meredith Shannon  Procedure(s) Performed: REMOVAL OF HARDWARE LEFT DISTAL RADIUS FRACTURE (Left: Wrist)  Patient Location: PACU  Anesthesia Type:MAC combined with regional for post-op pain  Level of Consciousness: awake, alert  and oriented  Airway & Oxygen Therapy: Patient Spontanous Breathing and Patient connected to face mask oxygen  Post-op Assessment: Report given to RN and Post -op Vital signs reviewed and stable  Post vital signs: Reviewed and stable  Last Vitals:  Vitals Value Taken Time  BP    Temp    Pulse    Resp    SpO2      Last Pain:  Vitals:   06/18/21 1337  TempSrc: Oral  PainSc:       Patients Stated Pain Goal: 4 (81/82/99 [id number])  Complications: No notable events documented.

## 2021-06-18 NOTE — Anesthesia Procedure Notes (Signed)
Anesthesia Regional Block: Supraclavicular block   Pre-Anesthetic Checklist: , timeout performed,  Correct Patient, Correct Site, Correct Laterality,  Correct Procedure, Correct Position, site marked,  Risks and benefits discussed,  Surgical consent,  Pre-op evaluation,  At surgeon's request and post-op pain management  Laterality: Left and Upper  Prep: chloraprep       Needles:  Injection technique: Single-shot  Needle Type: Echogenic Needle     Needle Length: 5cm  Needle Gauge: 21     Additional Needles:   Procedures:,,,, ultrasound used (permanent image in chart),,    Narrative:  Start time: 06/18/2021 1:51 PM End time: 06/18/2021 1:59 PM Injection made incrementally with aspirations every 5 mL.  Performed by: Personally  Anesthesiologist: Barnet Glasgow, MD

## 2021-06-18 NOTE — Brief Op Note (Signed)
06/18/2021  4:07 PM  PATIENT:  Meredith Shannon  65 y.o. female  PRE-OPERATIVE DIAGNOSIS:  Removal of Hardware Left Wrist  POST-OPERATIVE DIAGNOSIS:  Removal of Hardware Left Wrist  PROCEDURE:  Procedure(s): REMOVAL OF HARDWARE LEFT DISTAL RADIUS FRACTURE (Left)  SURGEON:  Surgeon(s) and Role:    * Sherilyn Cooter, MD - Primary  PHYSICIAN ASSISTANT:   ASSISTANTS: none   ANESTHESIA:   none  EBL:  10 mL   BLOOD ADMINISTERED:none  DRAINS: none   LOCAL MEDICATIONS USED:  LIDOCAINE   SPECIMEN:  No Specimen  DISPOSITION OF SPECIMEN:  N/A  COUNTS:  YES  TOURNIQUET:   Total Tourniquet Time Documented: Upper Arm (Left) - 16 minutes Total: Upper Arm (Left) - 16 minutes   DICTATION: .Viviann Spare Dictation  PLAN OF CARE: Discharge to home after PACU  PATIENT DISPOSITION:  PACU - hemodynamically stable.   Delay start of Pharmacological VTE agent (>24hrs) due to surgical blood loss or risk of bleeding: not applicable

## 2021-06-19 ENCOUNTER — Encounter (HOSPITAL_BASED_OUTPATIENT_CLINIC_OR_DEPARTMENT_OTHER): Payer: Self-pay | Admitting: Orthopedic Surgery

## 2021-06-20 ENCOUNTER — Encounter: Payer: Self-pay | Admitting: Occupational Therapy

## 2021-06-20 ENCOUNTER — Ambulatory Visit (INDEPENDENT_AMBULATORY_CARE_PROVIDER_SITE_OTHER): Payer: BC Managed Care – PPO | Admitting: Occupational Therapy

## 2021-06-20 ENCOUNTER — Other Ambulatory Visit: Payer: Self-pay

## 2021-06-20 DIAGNOSIS — R601 Generalized edema: Secondary | ICD-10-CM | POA: Diagnosis not present

## 2021-06-20 DIAGNOSIS — R278 Other lack of coordination: Secondary | ICD-10-CM

## 2021-06-20 DIAGNOSIS — M25532 Pain in left wrist: Secondary | ICD-10-CM

## 2021-06-20 DIAGNOSIS — M6281 Muscle weakness (generalized): Secondary | ICD-10-CM | POA: Diagnosis not present

## 2021-06-20 DIAGNOSIS — M25632 Stiffness of left wrist, not elsewhere classified: Secondary | ICD-10-CM | POA: Diagnosis not present

## 2021-06-20 NOTE — Therapy (Signed)
[facility] [address] [place], [place], [address] Phone: [phone]   Fax:  [phone]  Occupational Therapy Evaluation  Patient Details  Name: Meredith Shannon MRN: [id number] Date of Birth: 10-23-1955 Referring Provider (OT): [place]   Encounter Date: 06/20/2021   OT End of Session - 06/20/21 0919     Visit Number 1    Number of Visits 8    Date for OT Re-Evaluation 07/18/21   Every 10 visits   Authorization Type [employer] PPO    OT Start Time 0802    OT Stop Time 0910    OT Time Calculation (min) 68 min    Activity Tolerance Patient tolerated treatment well    Behavior During Therapy [facility] for tasks assessed/performed             Past Medical History:  Diagnosis Date   Anxiety    Broken wrist 2008   Cancer [facility])    basal cell   Chest pain 2018   seen by Dr. Ellyn Hack.  Has mid LAD stenosis.   Chest pain    Constipation    GERD (gastroesophageal reflux disease)    occasional uses tums   High cholesterol    In total cholesterol and triglycerides.   High triglycerides    HSV-1 infection    Joint pain    Lichen simplex chronicus    Sleep apnea    Swallowing difficulty    Vitamin D deficiency     Past Surgical History:  Procedure Laterality Date   BREAST BIOPSY  5/12   fibrocystic changes   CYSTOSCOPY N/A 03/09/2017   Procedure: CYSTOSCOPY;  Surgeon: Megan Salon, MD;  Location: [facility];  Service: Gynecology;  Laterality: N/A;   DILATATION & CURETTAGE/HYSTEROSCOPY WITH MYOSURE N/A 12/26/2016   Procedure: [place];  Surgeon: Megan Salon, MD;  Location: [facility];  Service: Gynecology;  Laterality: N/A;   DILATION AND CURETTAGE OF UTERUS     "years ago"   FACIAL COSMETIC SURGERY     HAND SURGERY Right 2012   fractured bone in hands   HARDWARE REMOVAL Left 06/18/2021   Procedure: REMOVAL OF HARDWARE LEFT DISTAL RADIUS FRACTURE;  Surgeon: Sherilyn Cooter, MD;   Location: [facility];  Service: Orthopedics;  Laterality: Left;   LAPAROSCOPIC HYSTERECTOMY N/A 03/09/2017   Procedure: HYSTERECTOMY TOTAL LAPAROSCOPIC;  Surgeon: Megan Salon, MD;  Location: [facility];  Service: Gynecology;  Laterality: N/A;   LAPAROSCOPIC SALPINGO OOPHERECTOMY Bilateral 03/09/2017   Procedure: LAPAROSCOPIC SALPINGO OOPHORECTOMY;  Surgeon: Megan Salon, MD;  Location: [facility];  Service: Gynecology;  Laterality: Bilateral;   OPEN REDUCTION INTERNAL FIXATION (ORIF) DISTAL RADIAL FRACTURE Left 04/12/2021   Procedure: OPEN REDUCTION INTERNAL FIXATION (ORIF) LEFT DISTAL RADIUS FRACTURE;  Surgeon: Sherilyn Cooter, MD;  Location: [facility];  Service: Orthopedics;  Laterality: Left;   TUBAL LIGATION  1983   WRIST FRACTURE SURGERY Right 2008    There were no vitals filed for this visit.   Subjective Assessment - 06/20/21 0808     Subjective  Pt reports that she underwent left wrist volar distal radius plate removal on 77/82/42. Her initial fracure 04/01/21.    Pertinent History Sleep apnea, anxiety, GERD, Cancer - basal cell, hyperlipidemia    Patient Stated Goals Get as much range of motion as possible    Currently in Pain? Yes    Pain Score 2  Pain Location Wrist    Pain Orientation Left    Pain Descriptors / Indicators Sore;Aching    Pain Type Acute pain    Pain Onset More than a month ago    Pain Frequency Intermittent    Aggravating Factors  Certain movement and activity.    Pain Relieving Factors Rest, pain medication and repositioning    Multiple Pain Sites No               [facility] OT Assessment - 06/20/21 0001       Assessment   Medical Diagnosis Left volar distal radius plate removal   L DR Fx 04/01/21 w/ ORIF   Referring Provider (OT) [place]    Onset Date/Surgical Date 06/18/21    Hand Dominance Right    Next MD Visit 2 weeks      Precautions   Precautions Other (comment)   No lift,  push, pull L UE     Home  Environment   Family/patient expects to be discharged to: Private residence      Prior Function   Level of Independence Independent    Vocation Full time employment   Works in Materials engineer, computer work    [place], rest      ADL   ADL comments Increased time for ADL's, difficulty opening containers, decreased strength, modified.      Mobility   Mobility Status Independent      Written Expression   Dominant Hand Right      Cognition   Overall Cognitive Status Within Functional Limits for tasks assessed      Observation/Other Assessments   Observations Pt with min edema noted within post-op cast. Finger flexion to base of post-op cast L UE      Sensation   Light Touch Appears Intact   Pt reports paresthesias in thumb left. Note nerve block on 06/18/21   Additional Comments Appears intact to light touch      Coordination   Gross Motor Movements are Fluid and Coordinated Not tested    Fine Motor Movements are Fluid and Coordinated Not tested      Edema   Edema Min edema noted L hand/UE      ROM / Strength   AROM / PROM / Strength AROM;Strength      AROM   Overall AROM  Deficits    Overall AROM Comments Hand and wrist    AROM Assessment Site Wrist;Finger;Elbow;Shoulder    Right/Left Shoulder Left    Left Shoulder Flexion 150 Degrees    Left Shoulder ABduction --   impaired   Left Shoulder Internal Rotation --   [facility]   Left Shoulder External Rotation --   Impaired   Right/Left Elbow Left    Left Elbow Flexion --   WNL's   Left Elbow Extension --   -12*   Right/Left Wrist Left      Strength   Overall Strength Unable to assess      Hand Function   Comment Pt with stiffness noted at MCP's digits 2-5 during finger flexion. Able to make 90% of flat fist left   Strength: NT L UE                     OT Treatments/Exercises (OP) - 06/20/21 0001       ADLs   ADL Comments Pt was educated in  splinting use, care and precautions, wrap brace during bathing until cleared by MD to  remove in shower/suture removal. May remove for home program and when at rest.      Exercises   Exercises Shoulder;Elbow;Wrist;Hand      Shoulder Exercises: Seated   Other Seated Exercises Active shoulder flexion, extension and ABD   HEP     Elbow Exercises   Other elbow exercises Active left elbow extension and flexion. Hold x5 seconds at end range   HEP, pt ed and instruction     Wrist Exercises   Other wrist exercises Active wrist RD/UD on tabletop, active wrist flexion/extension x10 reps ea, hold for 3-5 seconds. Reapply splint after complete      Splinting   Splinting Pt was fitted with a custom fabicated wrist orthosis following removal of left volar distal radius plate on 98/3/38. Her post op dressing and splint were removed in the clinic and she was redressed with xeroform over her volar wrst scar. There was no drainage. She was then wrapped using sterile 2x2's, gauze and elastic stockinette to her left wrist. She was fitted with a custom fabricated volar wrist/forearm splint that places her left wrist in neutral/slight extension. She was educated in splinting use, care and precautions and was instruced in an initial HEP to consist of gentle Active ROM for shoulder, elbow and wrist flex/exten, RD/UD and tendon gliding ex's L hand.. She was given handouts on all of the above. She will f/u in 1-2 weeks for splint check and adjustments and her HEP will be progressed at that time per protocol.                    OT Education - 06/20/21 0917     Education Details Pt was educated in intial HEP for tendon gliding and active wrist flex/exten, RD/UD. Active elbow flex/exten and shoulder flex/exten, ABD. Elevation for edema control    Person(s) Educated Patient    Methods Explanation;Demonstration;Verbal cues;Handout    Comprehension Verbalized understanding;Need further instruction               OT Short Term Goals - 06/20/21 1744       OT SHORT TERM GOAL #1   Title Pt will be Mod I splinting use, care and precautions L UE    Time 4    Period Weeks    Status New    Target Date 07/18/21      OT SHORT TERM GOAL #2   Title Pt wil lbe Mod I initial HEP L UE    Time 4    Period Weeks    Status New    Target Date 07/18/21      OT SHORT TERM GOAL #3   Title Pt will be Mod I edema control L UE as seen by I ability to state 2-3 techniques.    Time 4    Period Weeks    Status New    Target Date 07/18/21               OT Long Term Goals - 06/20/21 1746       OT LONG TERM GOAL #1   Title Pt will be Mod I updated HEP for L UE as seen by independent ability to perform in the clinc    Time 8    Period Weeks    Status New    Target Date 08/15/21      OT LONG TERM GOAL #2   Title Pt will be Mod I ADL's using left UE as non-dominant UE as seen by  simulated activity in the clinic    Time 8    Period Weeks    Status New    Target Date 08/15/21      OT LONG TERM GOAL #3   Title Pt will demonstrate functional A/ROM L wrist as seen by goniometer assessment within 10*  to R UE    Time 8    Period Weeks    Status New    Target Date 08/15/21      OT LONG TERM GOAL #4   Title Pt will demonstrate increased strength as seen by ability to grip 10# or more with L    Time 8    Period Weeks    Status New    Target Date 08/15/21                   Plan - 06/20/21 4008     Clinical Impression Statement Pt is a 65 y/o female whom sustained a left distal radius fracture with ORIF in 03/2021, she is now ~8 weeks post-op. On 06/18/21, she underwent removal of left volar distal radius plate. She presents today for splinting and HEP per Dr. Tempie Donning. She has deficits in the area of decreased A/ROM, edema, pain and functional use impacting ADL's. She will benefit from cont out-pt OT to address deficits and assist with pt education and splinting needs and HEP. She was  fitted with a custom volar wrist orthosis and instructed in use, care and precautions. She was also instructed in a HEP to consist of left hand tendon gliding, active wrist flex/exten, RD/UD, elbow flexion/extension and shoulder flex/exten, ABD. She will f/u in ~1-2 week for splint check, adjustments and upgrade of HEP.    OT Occupational Profile and History Problem Focused Assessment - Including review of records relating to presenting problem    Occupational performance deficits (Please refer to evaluation for details): ADL's    Body Structure / Function / Physical Skills ADL;Strength;Pain;Edema;UE functional use;ROM;Scar mobility;Coordination;Flexibility;Mobility;Sensation;Decreased knowledge of precautions;FMC    Rehab Potential Good    Clinical Decision Making Limited treatment options, no task modification necessary    Comorbidities Affecting Occupational Performance: May have comorbidities impacting occupational performance    Modification or Assistance to Complete Evaluation  No modification of tasks or assist necessary to complete eval    OT Frequency Other (comment)   1-2x/week   OT Duration 6 weeks    OT Treatment/Interventions Self-care/ADL training;Fluidtherapy;Splinting;Therapeutic activities;Therapeutic exercise;Scar mobilization;Cryotherapy;Passive range of motion;Manual Therapy;Patient/family education    Plan Splint check and adjustments, upgrade HEP as able, consider forearm pro/sup. Edema and scar management LUE.    Consulted and Agree with Plan of Care Patient             Patient will benefit from skilled therapeutic intervention in order to improve the following deficits and impairments:   Body Structure / Function / Physical Skills: ADL, Strength, Pain, Edema, UE functional use, ROM, Scar mobility, Coordination, Flexibility, Mobility, Sensation, Decreased knowledge of precautions, [place]       Visit Diagnosis: Stiffness of left wrist, not elsewhere classified - Plan: Ot  plan of care cert/re-cert  Muscle weakness (generalized) - Plan: Ot plan of care cert/re-cert  Generalized edema - Plan: Ot plan of care cert/re-cert  Other lack of coordination - Plan: Ot plan of care cert/re-cert  Pain in left wrist - Plan: Ot plan of care cert/re-cert  WEARING SCHEDULE:  Wear splint at ALL times except for hygiene care (May remove splint for exercises and then  immediately place back on ONLY if directed by the therapist).  PURPOSE:  To prevent movement and for protection until injury can heal  CARE OF SPLINT:  Keep splint away from heat sources including: stove, radiator or furnace, or a car in sunlight. The splint can melt and will no longer fit you properly  Keep away from pets and children  Clean the splint with rubbing alcohol as needed. * During this time, make sure you also clean your hand/arm as instructed by your therapist and/or perform dressing changes as needed. Then dry hand/arm completely before replacing splint. (When cleaning hand/arm, keep it immobilized in same position until splint is replaced)  PRECAUTIONS/POTENTIAL PROBLEMS: *If you notice or experience increased pain, swelling, numbness, or a lingering reddened area from the splint: Contact your therapist immediately by calling [phone]. You must wear the splint for protection, but we will get you scheduled for adjustments as quickly as possible.  (If only straps or hooks need to be replaced and NO adjustments to the splint need to be made, just call the office ahead and let them know you are coming in)  If you have any medical concerns or signs of infection, please call your doctor immediately   Problem List Patient Active Problem List   Diagnosis Date Noted   H/O: hysterectomy 04/14/2021   Distal radius fracture, left 04/09/2021   OSA (obstructive sleep apnea) 12/11/2020   Polyphagia 10/22/2020   Secondary erythrocytosis 09/14/2020   Class 1 obesity due to excess calories with body  mass index (BMI) of 30.0 to 30.9 in adult 08/22/2020   Acid reflux 08/22/2020   Dysphagia 08/22/2020   Globus sensation 05/18/2018   Chest pain with moderate risk for cardiac etiology - concern for coronary spasm 08/26/2016   Dyspareunia, female 26/33/3545   Lichen simplex chronicus 06/01/2015   Hyperlipidemia 06/01/2015   Other and unspecified hyperlipidemia 04/14/2014    Almyra Deforest, OT/L 06/20/2021, 5:56 PM  [facility] [address] [place], [place], [address] Phone: [phone]   Fax:  [phone]  Name: Meredith Shannon MRN: [id number] Date of Birth: 1956/01/17

## 2021-06-20 NOTE — Patient Instructions (Signed)
WEARING SCHEDULE:  Wear splint at ALL times except for hygiene care (May remove splint for exercises and then immediately place back on ONLY if directed by the therapist).  PURPOSE:  To prevent movement and for protection until injury can heal  CARE OF SPLINT:  Keep splint away from heat sources including: stove, radiator or furnace, or a car in sunlight. The splint can melt and will no longer fit you properly  Keep away from pets and children  Clean the splint with rubbing alcohol as needed. * During this time, make sure you also clean your hand/arm as instructed by your therapist and/or perform dressing changes as needed. Then dry hand/arm completely before replacing splint. (When cleaning hand/arm, keep it immobilized in same position until splint is replaced)  PRECAUTIONS/POTENTIAL PROBLEMS: *If you notice or experience increased pain, swelling, numbness, or a lingering reddened area from the splint: Contact your therapist immediately by calling [phone]. You must wear the splint for protection, but we will get you scheduled for adjustments as quickly as possible.  (If only straps or hooks need to be replaced and NO adjustments to the splint need to be made, just call the office ahead and let them know you are coming in)  If you have any medical concerns or signs of infection, please call your doctor immediately  Flexor Tendon Gliding (Active Full Fist)    Straighten all fingers, then make a fist, bending all joints. Repeat ____ times. Do ____ sessions per day.  Flexor Tendon Gliding (Active Hook Fist)    With fingers and knuckles straight, bend middle and tip joints. Do not bend large knuckles. Repeat ____ times. Do ____ sessions per day.  Flexor Tendon Gliding (Active Straight Fist)    Start with fingers straight. Bend knuckles and middle joints. Keep fingertip joints straight to touch base of palm. Repeat ____ times. Do ____ sessions per day.  Wrist Flexion /  Extension    Hold elbows at 90 and close to body, with palms down. Bend both wrists so fingers point up. Then bend wrist so fingers point down. Repeat sequence ____ times per session. Do ____ sessions per week. Hand Variation: Thumbs up   Copyright  VHI. All rights reserved.

## 2021-06-21 DIAGNOSIS — G4733 Obstructive sleep apnea (adult) (pediatric): Secondary | ICD-10-CM | POA: Diagnosis not present

## 2021-06-26 ENCOUNTER — Telehealth: Payer: Self-pay | Admitting: Adult Health

## 2021-06-26 NOTE — Telephone Encounter (Signed)
Pt is asking the pressure be lowered even more, she is unable to tell a difference from last adjustment.  Pt states she will not be able to continue to use if pressure is not adjusted more.  Please call

## 2021-07-02 ENCOUNTER — Ambulatory Visit (INDEPENDENT_AMBULATORY_CARE_PROVIDER_SITE_OTHER): Payer: BC Managed Care – PPO | Admitting: Occupational Therapy

## 2021-07-02 ENCOUNTER — Other Ambulatory Visit: Payer: Self-pay

## 2021-07-02 ENCOUNTER — Encounter: Payer: Self-pay | Admitting: Occupational Therapy

## 2021-07-02 ENCOUNTER — Telehealth: Payer: Self-pay | Admitting: *Deleted

## 2021-07-02 ENCOUNTER — Encounter: Payer: Self-pay | Admitting: Adult Health

## 2021-07-02 DIAGNOSIS — G4733 Obstructive sleep apnea (adult) (pediatric): Secondary | ICD-10-CM

## 2021-07-02 DIAGNOSIS — M6281 Muscle weakness (generalized): Secondary | ICD-10-CM | POA: Diagnosis not present

## 2021-07-02 DIAGNOSIS — Z9989 Dependence on other enabling machines and devices: Secondary | ICD-10-CM

## 2021-07-02 DIAGNOSIS — R278 Other lack of coordination: Secondary | ICD-10-CM

## 2021-07-02 DIAGNOSIS — M25632 Stiffness of left wrist, not elsewhere classified: Secondary | ICD-10-CM

## 2021-07-02 DIAGNOSIS — R601 Generalized edema: Secondary | ICD-10-CM | POA: Diagnosis not present

## 2021-07-02 DIAGNOSIS — M25532 Pain in left wrist: Secondary | ICD-10-CM

## 2021-07-02 NOTE — Telephone Encounter (Signed)
Message was sent to [facility] yesterday 11/28, [place], she will address via mychart messages from 06/26/21

## 2021-07-02 NOTE — Telephone Encounter (Signed)
Lft message to go over Mychart message.

## 2021-07-02 NOTE — Telephone Encounter (Signed)
Called pt she states her husband will bring the machine by either Wednesday or Thursday to get the info downloaded.

## 2021-07-02 NOTE — Therapy (Signed)
[facility] [address] [place], [place], [address] Phone: [phone]   Fax:  [phone]  Occupational Therapy Treatment  Patient Details  Name: Meredith Shannon MRN: [id number] Date of Birth: 1956/04/13 Referring Provider (OT): [place]   Encounter Date: 07/02/2021   OT End of Session - 07/02/21 0900     Visit Number 2    Number of Visits 8    Date for OT Re-Evaluation 07/18/21   Every 10 visits   Authorization Type [employer] PPO    OT Start Time 0800    OT Stop Time 0835    OT Time Calculation (min) 35 min    Activity Tolerance Patient tolerated treatment well    Behavior During Therapy [facility] for tasks assessed/performed             Past Medical History:  Diagnosis Date   Anxiety    Broken wrist 2008   Cancer [facility])    basal cell   Chest pain 2018   seen by Dr. Ellyn Hack.  Has mid LAD stenosis.   Chest pain    Constipation    GERD (gastroesophageal reflux disease)    occasional uses tums   High cholesterol    In total cholesterol and triglycerides.   High triglycerides    HSV-1 infection    Joint pain    Lichen simplex chronicus    Sleep apnea    Swallowing difficulty    Vitamin D deficiency     Past Surgical History:  Procedure Laterality Date   BREAST BIOPSY  5/12   fibrocystic changes   CYSTOSCOPY N/A 03/09/2017   Procedure: CYSTOSCOPY;  Surgeon: Megan Salon, MD;  Location: [facility];  Service: Gynecology;  Laterality: N/A;   DILATATION & CURETTAGE/HYSTEROSCOPY WITH MYOSURE N/A 12/26/2016   Procedure: [place];  Surgeon: Megan Salon, MD;  Location: [facility];  Service: Gynecology;  Laterality: N/A;   DILATION AND CURETTAGE OF UTERUS     "years ago"   FACIAL COSMETIC SURGERY     HAND SURGERY Right 2012   fractured bone in hands   HARDWARE REMOVAL Left 06/18/2021   Procedure: REMOVAL OF HARDWARE LEFT DISTAL RADIUS FRACTURE;  Surgeon: Sherilyn Cooter, MD;   Location: [place];  Service: Orthopedics;  Laterality: Left;   LAPAROSCOPIC HYSTERECTOMY N/A 03/09/2017   Procedure: HYSTERECTOMY TOTAL LAPAROSCOPIC;  Surgeon: Megan Salon, MD;  Location: [facility];  Service: Gynecology;  Laterality: N/A;   LAPAROSCOPIC SALPINGO OOPHERECTOMY Bilateral 03/09/2017   Procedure: LAPAROSCOPIC SALPINGO OOPHORECTOMY;  Surgeon: Megan Salon, MD;  Location: [facility];  Service: Gynecology;  Laterality: Bilateral;   OPEN REDUCTION INTERNAL FIXATION (ORIF) DISTAL RADIAL FRACTURE Left 04/12/2021   Procedure: OPEN REDUCTION INTERNAL FIXATION (ORIF) LEFT DISTAL RADIUS FRACTURE;  Surgeon: Sherilyn Cooter, MD;  Location: [place];  Service: Orthopedics;  Laterality: Left;   TUBAL LIGATION  1983   WRIST FRACTURE SURGERY Right 2008    There were no vitals filed for this visit.   Subjective Assessment - 07/02/21 0804     Subjective  Pt reports that her hand has increased edeam since her last visit.    Pertinent History Sleep apnea, anxiety, GERD, Cancer - basal cell, hyperlipidemia    Patient Stated Goals Get as much range of motion as possible    Currently in Pain? Yes    Pain Score 1     Pain Location Wrist  Pain Orientation Left    Pain Descriptors / Indicators Aching;Discomfort    Pain Type Acute pain    Pain Onset More than a month ago    Pain Frequency Intermittent    Multiple Pain Sites No                [facility] OT Assessment - 07/02/21 0001       ROM / Strength   AROM / PROM / Strength AROM;Strength      AROM   Overall AROM  Deficits    Overall AROM Comments Hand and wrist    AROM Assessment Site Wrist      Hand Function   Comment AROM L wrist Extension 28* Flexion=42* RD/UD = 21*/14*                 OT Treatments/Exercises (OP) - 07/02/21 0001       ADLs   ADL Comments Review of HEP and splint use. Pt is currently 11 weeks and 4 days since initial ORIF L wrist  and 2 weeks s/p hardware removal from L distal radius fracture. Pt was encouraged to remove splint at home for light ADL's or when at rest. Pt reports removing splint when at work and sitting at her desk at this time. Pt was also advised to discuss splint use with MD at her appointment on 07/05/21. If cleared by MD, pt may benefit from weaning from splint and only wearing during heavier activity vs maintaining neutral wrist in the splint, as she has experienced some soreness in her first webspace and ulnar styloid over last week. This is likely due to using her hand, during functional activity, in the confines of the splint and causing friction.      Exercises   Exercises Shoulder;Elbow;Wrist;Hand      Shoulder Exercises: Seated   Other Seated Exercises Active shoulder flexion, extension and ABD   Pt reports that she sometimes forgets to do these     Elbow Exercises   Other elbow exercises Active left elbow extension and flexion. Hold x5 seconds at end range   LUE. Pt reports that she sometimes forgets to do these at home.     Wrist Exercises   Other wrist exercises Verbal review and performance of gentle active wrist RD/UD, flexion/extension x10 reps ea holding for a count of 3-5 sec ea. Pt is re-applying splint after doing these at home. Pt may benefit from geltle active tabletop flexion/extension and assist from gravity for slow, gentle stretch.    Other wrist exercises Added gentle active forearm pronation and supination to HEP today L wrist.      Hand Exercises   Other Hand Exercises Active tendon gliding ex's left hand.      Splinting   Splinting Splint check and minor adjustments today to include heating and flare at 1st web space. Moleskin added for comfort and small gel pad issued to use PRN for comfort. Pt was advised to make sure that she isn't working against the splint which may be causing some discomfort or edema. Discussed cryotherapy and tendon gliding as well as compressive  stockinette PRN. Pt was also educated in scar management and massage after suture removal later this week. Pt reports that she is removing splint when sitting at her desk at work at this time. Pt wil lf/u with MD 07/05/21 for suture removal and will discuss decreasing her splint use at that time based on his assessment.  OT Education - 07/02/21 0858     Education Details Pt was educated in [place]. Elevation and cryotherapy PRN for edema control, neutral wrist during light functional activity. Pt to discuss continued splint use with MD at next visit 07/05/21.    Person(s) Educated Patient    Methods Explanation;Demonstration;Verbal cues    Comprehension Verbalized understanding;Need further instruction              OT Short Term Goals - 07/02/21 0914       OT SHORT TERM GOAL #1   Title Pt will be Mod I splinting use, care and precautions L UE    Time 4    Period Weeks    Status Achieved    Target Date 07/18/21      OT SHORT TERM GOAL #2   Title Pt wil lbe Mod I initial HEP L UE    Time 4    Period Weeks    Status Achieved    Target Date 07/18/21      OT SHORT TERM GOAL #3   Title Pt will be Mod I edema control L UE as seen by I ability to state 2-3 techniques.    Time 4    Period Weeks    Status On-going    Target Date 07/18/21               OT Long Term Goals - 06/20/21 1746       OT LONG TERM GOAL #1   Title Pt will be Mod I updated HEP for L UE as seen by independent ability to perform in the clinc    Time 8    Period Weeks    Status New    Target Date 08/15/21      OT LONG TERM GOAL #2   Title Pt will be Mod I ADL's using left UE as non-dominant UE as seen by simulated activity in the clinic    Time 8    Period Weeks    Status New    Target Date 08/15/21      OT LONG TERM GOAL #3   Title Pt will demonstrate functional A/ROM L wrist as seen by goniometer assessment within 10*  to R UE    Time 8    Period Weeks    Status New     Target Date 08/15/21      OT LONG TERM GOAL #4   Title Pt will demonstrate increased strength as seen by ability to grip 10# or more with L    Time 8    Period Weeks    Status New    Target Date 08/15/21                   Plan - 07/02/21 0901     Clinical Impression Statement Pt presents to out-pt OT/hand therapy for splint check and adjustments today. She is currently 11 weeks and 4 days since her initial left distal radius ORIF (04/12/21) and 2 weeks post op hardware/volar distal radius plate removal (DOS: 99/37/16).  She has c/o soreness in her first webspace and along her left ulnar styloid at times. Her splint was re-heated and flared at her first web space, moleskin was added for comfort and pt was educated to maintain neutral wrist during light functional activity when wearing splint. Pt is currently removing splint when sitting at her desk/working. She appears independent with her current HEP of gentle A/ROM for wrist and forearm A/ROM and tendon  gliding. She is re-applying her splint after. She was instructed in scar management (after suture removal) and edema control techniques. She will discuss the possibility of weaning from the splint with Dr Tempie Donning at her next appointment this Friday, 07/05/21 and we will upgrade her HEP as able as per protocol.    OT Occupational Profile and History Problem Focused Assessment - Including review of records relating to presenting problem    Occupational performance deficits (Please refer to evaluation for details): ADL's    Body Structure / Function / Physical Skills ADL;Strength;Pain;Edema;UE functional use;ROM;Scar mobility;Coordination;Flexibility;Mobility;Sensation;Decreased knowledge of precautions;FMC    Rehab Potential Good    Clinical Decision Making Limited treatment options, no task modification necessary    Comorbidities Affecting Occupational Performance: May have comorbidities impacting occupational performance     Modification or Assistance to Complete Evaluation  No modification of tasks or assist necessary to complete eval    OT Frequency Other (comment)   1-2x/week   OT Duration 6 weeks    OT Treatment/Interventions Self-care/ADL training;Fluidtherapy;Splinting;Therapeutic activities;Therapeutic exercise;Scar mobilization;Cryotherapy;Passive range of motion;Manual Therapy;Patient/family education    Plan Consider weaning from splint (check MD note from 07/05/21) & adjust PRN, upgrade HEP, edema and scar management LUE. Consider light putty for grip when able.    Consulted and Agree with Plan of Care Patient             Patient will benefit from skilled therapeutic intervention in order to improve the following deficits and impairments:   Body Structure / Function / Physical Skills: ADL, Strength, Pain, Edema, UE functional use, ROM, Scar mobility, Coordination, Flexibility, Mobility, Sensation, Decreased knowledge of precautions, [facility]       Visit Diagnosis: Stiffness of left wrist, not elsewhere classified  Muscle weakness (generalized)  Generalized edema  Other lack of coordination  Pain in left wrist    Problem List Patient Active Problem List   Diagnosis Date Noted   H/O: hysterectomy 04/14/2021   Distal radius fracture, left 04/09/2021   OSA (obstructive sleep apnea) 12/11/2020   Polyphagia 10/22/2020   Secondary erythrocytosis 09/14/2020   Class 1 obesity due to excess calories with body mass index (BMI) of 30.0 to 30.9 in adult 08/22/2020   Acid reflux 08/22/2020   Dysphagia 08/22/2020   Globus sensation 05/18/2018   Chest pain with moderate risk for cardiac etiology - concern for coronary spasm 08/26/2016   Dyspareunia, female 88/82/8003   Lichen simplex chronicus 06/01/2015   Hyperlipidemia 06/01/2015   Other and unspecified hyperlipidemia 04/14/2014    Almyra Deforest, OT/L 07/02/2021, 9:19 AM  [facility] [address] [place], [place], [address] Phone: [phone]   Fax:  [phone]  Name: Meredith Shannon MRN: [id number] Date of Birth: 21-May-1956

## 2021-07-02 NOTE — Telephone Encounter (Signed)
Husband brought machine, data downloaded and report placed on NP desk for review.

## 2021-07-02 NOTE — Telephone Encounter (Signed)
Noted, pt is on tagged on resvent site

## 2021-07-02 NOTE — Telephone Encounter (Signed)
Received order form for CPAP supplies from [place]. Placed on NP's desk for completion, signature.

## 2021-07-02 NOTE — Telephone Encounter (Signed)
Recent telephone note with Meredith Blossom, RN on 11/23 advising patient that we would need to obtain a new download prior to making any pressure adjustments. Did we get a new download yet?

## 2021-07-03 NOTE — Telephone Encounter (Signed)
Based on recent compliance report from 06/03/2021 -07/02/2021 compliance remains inadequate with residual AHI 5.0.  Pressure in the 95th percentile 6.5 and max 8.6.  Leaks in the 95th percentile 20.8.  Current settings min pressure 5 and max pressure 9.   Unable to change pressure as currently at 9 and max pressure used at 8.6. IPR currently off - would recommend turning this on at level 3 to see if this helps with comfort. Please remind her that it will take some time to adjust to using CPAP and using during the day (as discussed at our prior visit) will help improve tolerance. A new order requesting above changes will be sent to DME company.

## 2021-07-03 NOTE — Addendum Note (Signed)
Addended by: Frann Rider L on: 07/03/2021 12:42 PM   Modules accepted: Orders

## 2021-07-03 NOTE — Telephone Encounter (Signed)
I called the pt and we discussed message. She is agreeable to the changes recommended by Janett Billow. Order has been sent to [employer] pt will call back once order has been updated and let us know how she is doing.

## 2021-07-05 ENCOUNTER — Other Ambulatory Visit: Payer: Self-pay

## 2021-07-05 ENCOUNTER — Ambulatory Visit (INDEPENDENT_AMBULATORY_CARE_PROVIDER_SITE_OTHER): Payer: BC Managed Care – PPO | Admitting: Orthopedic Surgery

## 2021-07-05 DIAGNOSIS — S52572A Other intraarticular fracture of lower end of left radius, initial encounter for closed fracture: Secondary | ICD-10-CM

## 2021-07-07 NOTE — Progress Notes (Signed)
Post-Op Visit Note   Patient: Meredith Shannon           Date of Birth: May 08, 1956           MRN: [id number] Visit Date: 07/05/2021 PCP: Aletha Halim., PA-C   Assessment & Plan:  Chief Complaint:  Chief Complaint  Patient presents with   Left Wrist - Routine Post Op    Hardware removal left wrist   Visit Diagnoses:  1. Other closed intra-articular fracture of distal end of left radius, initial encounter     Plan: Pt now two weeks status post plate removal.  Doing well postoperatively.  Pain well controlled.  Incision well healed.  Sutures removes.  She will continue hand therapy to work on ROM and strengthening.   Follow-Up Instructions: No follow-ups on file.   Orders:  No orders of the defined types were placed in this encounter.  No orders of the defined types were placed in this encounter.   Imaging: No results found.  PMFS History: Patient Active Problem List   Diagnosis Date Noted   H/O: hysterectomy 04/14/2021   Distal radius fracture, left 04/09/2021   OSA (obstructive sleep apnea) 12/11/2020   Polyphagia 10/22/2020   Secondary erythrocytosis 09/14/2020   Class 1 obesity due to excess calories with body mass index (BMI) of 30.0 to 30.9 in adult 08/22/2020   Acid reflux 08/22/2020   Dysphagia 08/22/2020   Globus sensation 05/18/2018   Chest pain with moderate risk for cardiac etiology - concern for coronary spasm 08/26/2016   Dyspareunia, female 96/22/2979   Lichen simplex chronicus 06/01/2015   Hyperlipidemia 06/01/2015   Other and unspecified hyperlipidemia 04/14/2014   Past Medical History:  Diagnosis Date   Anxiety    Broken wrist 2008   Cancer [facility])    basal cell   Chest pain 2018   seen by Dr. Ellyn Hack.  Has mid LAD stenosis.   Chest pain    Constipation    GERD (gastroesophageal reflux disease)    occasional uses tums   High cholesterol    In total cholesterol and triglycerides.   High triglycerides    HSV-1 infection    Joint pain     Lichen simplex chronicus    Sleep apnea    Swallowing difficulty    Vitamin D deficiency     Family History  Problem Relation Age of Onset   Osteoporosis Mother    High blood pressure Mother    High Cholesterol Mother    Depression Mother    Sudden death Father    Alcohol abuse Father     Past Surgical History:  Procedure Laterality Date   BREAST BIOPSY  5/12   fibrocystic changes   CYSTOSCOPY N/A 03/09/2017   Procedure: CYSTOSCOPY;  Surgeon: Megan Salon, MD;  Location: [facility];  Service: Gynecology;  Laterality: N/A;   DILATATION & CURETTAGE/HYSTEROSCOPY WITH MYOSURE N/A 12/26/2016   Procedure: [place];  Surgeon: Megan Salon, MD;  Location: [place] [facility];  Service: Gynecology;  Laterality: N/A;   DILATION AND CURETTAGE OF UTERUS     "years ago"   FACIAL COSMETIC SURGERY     HAND SURGERY Right 2012   fractured bone in hands   HARDWARE REMOVAL Left 06/18/2021   Procedure: REMOVAL OF HARDWARE LEFT DISTAL RADIUS FRACTURE;  Surgeon: Sherilyn Cooter, MD;  Location: [place];  Service: Orthopedics;  Laterality: Left;   LAPAROSCOPIC HYSTERECTOMY N/A 03/09/2017   Procedure: HYSTERECTOMY  TOTAL LAPAROSCOPIC;  Surgeon: Megan Salon, MD;  Location[facility];  Service: Gynecology;  Laterality: N/A;   LAPAROSCOPIC SALPINGO OOPHERECTOMY Bilateral 03/09/2017   Procedure: LAPAROSCOPIC SALPINGO OOPHORECTOMY;  Surgeon: Megan Salon, MD;  Location: [facility];  Service: Gynecology;  Laterality: Bilateral;   OPEN REDUCTION INTERNAL FIXATION (ORIF) DISTAL RADIAL FRACTURE Left 04/12/2021   Procedure: OPEN REDUCTION INTERNAL FIXATION (ORIF) LEFT DISTAL RADIUS FRACTURE;  Surgeon: Sherilyn Cooter, MD;  Location: [place];  Service: Orthopedics;  Laterality: Left;   TUBAL LIGATION  1983   WRIST FRACTURE SURGERY Right 2008   Social History   Occupational History   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

## 2021-07-10 ENCOUNTER — Ambulatory Visit (INDEPENDENT_AMBULATORY_CARE_PROVIDER_SITE_OTHER): Payer: BC Managed Care – PPO | Admitting: Occupational Therapy

## 2021-07-10 ENCOUNTER — Encounter: Payer: Self-pay | Admitting: Occupational Therapy

## 2021-07-10 ENCOUNTER — Other Ambulatory Visit: Payer: Self-pay

## 2021-07-10 DIAGNOSIS — M6281 Muscle weakness (generalized): Secondary | ICD-10-CM | POA: Diagnosis not present

## 2021-07-10 DIAGNOSIS — R278 Other lack of coordination: Secondary | ICD-10-CM

## 2021-07-10 DIAGNOSIS — M25632 Stiffness of left wrist, not elsewhere classified: Secondary | ICD-10-CM

## 2021-07-10 DIAGNOSIS — M25532 Pain in left wrist: Secondary | ICD-10-CM

## 2021-07-10 DIAGNOSIS — R601 Generalized edema: Secondary | ICD-10-CM | POA: Diagnosis not present

## 2021-07-10 NOTE — Therapy (Signed)
[facility] Physical Therapy [address] [place], [place], [address] Phone: [phone]   Fax:  [phone]  Occupational Therapy Treatment  Patient Details  Name: KEYSI OELKERS MRN: [id number] Date of Birth: 05-14-1956 Referring Provider (OT): [place]   Encounter Date: 07/10/2021   OT End of Session - 07/10/21 1716     Visit Number 3    Number of Visits 8    Date for OT Re-Evaluation 07/18/21   every 10 visits   Authorization Type [employer] PPO    OT Start Time [phone]    OT Stop Time 0927    OT Time Calculation (min) 44 min    Activity Tolerance Patient tolerated treatment well    Behavior During Therapy [facility] for tasks assessed/performed             Past Medical History:  Diagnosis Date   Anxiety    Broken wrist 2008   Cancer [facility])    basal cell   Chest pain 2018   seen by Dr. Ellyn Hack.  Has mid LAD stenosis.   Chest pain    Constipation    GERD (gastroesophageal reflux disease)    occasional uses tums   High cholesterol    In total cholesterol and triglycerides.   High triglycerides    HSV-1 infection    Joint pain    Lichen simplex chronicus    Sleep apnea    Swallowing difficulty    Vitamin D deficiency     Past Surgical History:  Procedure Laterality Date   BREAST BIOPSY  5/12   fibrocystic changes   CYSTOSCOPY N/A 03/09/2017   Procedure: CYSTOSCOPY;  Surgeon: Megan Salon, MD;  Location: [facility];  Service: Gynecology;  Laterality: N/A;   DILATATION & CURETTAGE/HYSTEROSCOPY WITH MYOSURE N/A 12/26/2016   Procedure: [place];  Surgeon: Megan Salon, MD;  Location: [facility];  Service: Gynecology;  Laterality: N/A;   DILATION AND CURETTAGE OF UTERUS     "years ago"   FACIAL COSMETIC SURGERY     HAND SURGERY Right 2012   fractured bone in hands   HARDWARE REMOVAL Left 06/18/2021   Procedure: REMOVAL OF HARDWARE LEFT DISTAL RADIUS FRACTURE;  Surgeon: Sherilyn Cooter, MD;   Location: [place];  Service: Orthopedics;  Laterality: Left;   LAPAROSCOPIC HYSTERECTOMY N/A 03/09/2017   Procedure: HYSTERECTOMY TOTAL LAPAROSCOPIC;  Surgeon: Megan Salon, MD;  [facility];  Service: Gynecology;  Laterality: N/A;   LAPAROSCOPIC SALPINGO OOPHERECTOMY Bilateral 03/09/2017   Procedure: LAPAROSCOPIC SALPINGO OOPHORECTOMY;  Surgeon: Megan Salon, MD;  Location: [facility];  Service: Gynecology;  Laterality: Bilateral;   OPEN REDUCTION INTERNAL FIXATION (ORIF) DISTAL RADIAL FRACTURE Left 04/12/2021   Procedure: OPEN REDUCTION INTERNAL FIXATION (ORIF) LEFT DISTAL RADIUS FRACTURE;  Surgeon: Sherilyn Cooter, MD;  Location: [place];  Service: Orthopedics;  Laterality: Left;   TUBAL LIGATION  1983   WRIST FRACTURE SURGERY Right 2008    There were no vitals filed for this visit.   Subjective Assessment - 07/10/21 0848     Subjective  Pt reports that "Dr Tempie Donning wants to come see me today when I'm here" Pt reports that she has pain rating 1/10 but has noticed increased edema.    Pertinent History Sleep apnea, anxiety, GERD, Cancer - basal cell, hyperlipidemia    Patient Stated Goals Get as much range of motion as possible    Currently in Pain? Yes  Pain Score 1     Pain Location Wrist    Pain Orientation Left    Pain Descriptors / Indicators Sore;Aching    Pain Type Acute pain    Pain Onset More than a month ago    Pain Frequency Intermittent    Aggravating Factors  A/ROM, movement, activity    Pain Relieving Factors Rest, repositioning, repositioning    Multiple Pain Sites No                   OT Treatments/Exercises (OP) - 07/10/21 0001       ADLs   ADL Comments Pt reports that she is removing splint during day at work (computer/typing etc) and with light ADL's and functional activity to assist with increasing A/ROM as we had discussed at her last appointment. Pt states that she is  only wearing her splint during heavy activity and when driving. She is sleeping at noc without it as well. She is mod I with ADL's but notes that she has fluctuation in edema in her left hand/wrist.      Exercises   Exercises Shoulder;Elbow;Wrist;Hand      Elbow Exercises   Other elbow exercises Reviewed Active left elbow extension and flexion. Hold x5 seconds at end range      Wrist Exercises   Other wrist exercises Performance of gentle active wrist RD/UD, flexion/extension at taletop x10 reps ea holding for a count of 5 sec. Pt appears to benefit from gentle active tabletop flexion/extension and assist from gravity for slow, gentle stretch.    Other wrist exercises Forearm pronation and supination to HEP today L wrist. Added putty to HEP and reviewed shoulder stretches at door/wall. Handout issued.      Hand Exercises   Other Hand Exercises Verbal review of active tendon gliding ex's left hand.      Splinting   Splinting Pt educated to continue weaning from splint. She is currently removing for light functional activity and ADL's/bathing etc. She also removes at work and when typing etc. No adjustments required.      Manual Therapy   Manual Therapy Edema management    Manual therapy comments R UE retrograde massage and gentle P/ROM for edema control x15 min   Pt with increased A/ROM after               OT Education - 07/10/21 1715     Education Details Upgraded HEP. Wean from splint at rest and during ADl/light functional use, bathing etc.    Person(s) Educated Patient    Methods Explanation;Demonstration;Verbal cues;Handout    Comprehension Verbalized understanding;Need further instruction              OT Short Term Goals - 07/02/21 0914       OT SHORT TERM GOAL #1   Title Pt will be Mod I splinting use, care and precautions L UE    Time 4    Period Weeks    Status Achieved    Target Date 07/18/21      OT SHORT TERM GOAL #2   Title Pt wil lbe Mod I initial  HEP L UE    Time 4    Period Weeks    Status Achieved    Target Date 07/18/21      OT SHORT TERM GOAL #3   Title Pt will be Mod I edema control L UE as seen by I ability to state 2-3 techniques.    Time 4  Period Weeks    Status On-going    Target Date 07/18/21               OT Long Term Goals - 06/20/21 1746       OT LONG TERM GOAL #1   Title Pt will be Mod I updated HEP for L UE as seen by independent ability to perform in the clinc    Time 8    Period Weeks    Status New    Target Date 08/15/21      OT LONG TERM GOAL #2   Title Pt will be Mod I ADL's using left UE as non-dominant UE as seen by simulated activity in the clinic    Time 8    Period Weeks    Status New    Target Date 08/15/21      OT LONG TERM GOAL #3   Title Pt will demonstrate functional A/ROM L wrist as seen by goniometer assessment within 10*  to R UE    Time 8    Period Weeks    Status New    Target Date 08/15/21      OT LONG TERM GOAL #4   Title Pt will demonstrate increased strength as seen by ability to grip 10# or more with L    Time 8    Period Weeks    Status New    Target Date 08/15/21                Plan - 07/10/21 1717     Clinical Impression Statement Pt should benefit from continued edema control and upgraded home program for A/ROM and A/AROM left wrist. She is currently 3 weeks s/p hardware removal and has been weaning from her splint when at rest, during light ADL's/functional activity and when at work on the computer etc. She was with decreased edema after retrograde massage and edema control techniques today in the clinic. Cont as per protocol.    OT Occupational Profile and History Problem Focused Assessment - Including review of records relating to presenting problem    Occupational performance deficits (Please refer to evaluation for details): ADL's    Body Structure / Function / Physical Skills ADL;Strength;Pain;Edema;UE functional use;ROM;Scar  mobility;Coordination;Flexibility;Mobility;Sensation;Decreased knowledge of precautions;FMC    Rehab Potential Good    Clinical Decision Making Limited treatment options, no task modification necessary    Comorbidities Affecting Occupational Performance: May have comorbidities impacting occupational performance    Modification or Assistance to Complete Evaluation  No modification of tasks or assist necessary to complete eval    OT Frequency Other (comment)   1-2x/week   OT Duration 6 weeks    OT Treatment/Interventions Self-care/ADL training;Fluidtherapy;Splinting;Therapeutic activities;Therapeutic exercise;Scar mobilization;Cryotherapy;Passive range of motion;Manual Therapy;Patient/family education    Plan Check remaining STG's, continue weaning from splint, upgrade HEP as per protocol, edema control  and scar management LUE.Marland Kitchen    Consulted and Agree with Plan of Care Patient             Patient will benefit from skilled therapeutic intervention in order to improve the following deficits and impairments:   Body Structure / Function / Physical Skills: ADL, Strength, Pain, Edema, UE functional use, ROM, Scar mobility, Coordination, Flexibility, Mobility, Sensation, Decreased knowledge of precautions, [place]       Visit Diagnosis: Stiffness of left wrist, not elsewhere classified  Muscle weakness (generalized)  Generalized edema  Other lack of coordination  Pain in left wrist  Grip Strengthening (Resistive Putty) Squeeze  putty using thumb and all fingers. Repeat _15-20__ times. Do __1-2__ times per day.  Lateral Pinch Strengthening (Resistive Putty) Squeeze between thumb and side of each finger in turn. Repeat __10__ times. Do __1-2_ sessions per day.  Pinch: Three Jaw Chuck Roll putty into a log on a table and pinch. Pull using left thumb, index and middle fingers. Repeat _10___ times. Do _1-2__ sessions per day.   Do your shoulder stretches at the wall and in the  doorway.  Do your wrist stretches at the table and focus on keeping your palm flat on the table.  Problem List Patient Active Problem List   Diagnosis Date Noted   H/O: hysterectomy 04/14/2021   Distal radius fracture, left 04/09/2021   OSA (obstructive sleep apnea) 12/11/2020   Polyphagia 10/22/2020   Secondary erythrocytosis 09/14/2020   Class 1 obesity due to excess calories with body mass index (BMI) of 30.0 to 30.9 in adult 08/22/2020   Acid reflux 08/22/2020   Dysphagia 08/22/2020   Globus sensation 05/18/2018   Chest pain with moderate risk for cardiac etiology - concern for coronary spasm 08/26/2016   Dyspareunia, female 83/25/4982   Lichen simplex chronicus 06/01/2015   Hyperlipidemia 06/01/2015   Other and unspecified hyperlipidemia 04/14/2014    Almyra Deforest, OT 07/10/2021, 5:23 PM  [facility] Physical Therapy [address] [place], [place], [address] Phone: [phone]   Fax:  [phone]  Name: ANGELL PINCOCK MRN: [id number] Date of Birth: 1956-05-11

## 2021-07-10 NOTE — Patient Instructions (Addendum)
Grip Strengthening (Resistive Putty)    Squeeze putty using thumb and all fingers. Repeat _15-20__ times. Do __1-2__ times per day.  Lateral Pinch Strengthening (Resistive Putty)    Squeeze between thumb and side of each finger in turn. Repeat __10__ times. Do __1-2_ sessions per day.  Pinch: Three Jaw Chuck   Roll putty into a log on a table and pinch. Pull using left thumb, index and middle fingers. Repeat _10___ times. Do _1-2__ sessions per day.   Do your shoulder stretches at the wall and in the doorway.  Do your wrist stretches at the table and focus on keeping your palm flat on the table.

## 2021-07-16 ENCOUNTER — Ambulatory Visit (INDEPENDENT_AMBULATORY_CARE_PROVIDER_SITE_OTHER): Payer: BC Managed Care – PPO | Admitting: Occupational Therapy

## 2021-07-16 ENCOUNTER — Encounter: Payer: Self-pay | Admitting: Occupational Therapy

## 2021-07-16 ENCOUNTER — Other Ambulatory Visit: Payer: Self-pay

## 2021-07-16 DIAGNOSIS — R601 Generalized edema: Secondary | ICD-10-CM | POA: Diagnosis not present

## 2021-07-16 DIAGNOSIS — M25532 Pain in left wrist: Secondary | ICD-10-CM

## 2021-07-16 DIAGNOSIS — R278 Other lack of coordination: Secondary | ICD-10-CM | POA: Diagnosis not present

## 2021-07-16 DIAGNOSIS — M25632 Stiffness of left wrist, not elsewhere classified: Secondary | ICD-10-CM | POA: Diagnosis not present

## 2021-07-16 DIAGNOSIS — M6281 Muscle weakness (generalized): Secondary | ICD-10-CM | POA: Diagnosis not present

## 2021-07-16 NOTE — Therapy (Signed)
[facility] Physical Therapy [address] [place], [place], [address] Phone: [phone]   Fax:  [phone]  Occupational Therapy Treatment  Patient Details  Name: Meredith Shannon MRN: [id number] Date of Birth: 06-Sep-1955 Referring Provider (OT): [place]   Encounter Date: 07/16/2021   OT End of Session - 07/16/21 1012     Visit Number 4    Number of Visits 8    Date for OT Re-Evaluation 08/13/21    Authorization Type [employer] Comm PPO    OT Start Time 0800    OT Stop Time 0845    OT Time Calculation (min) 45 min    Activity Tolerance Patient tolerated treatment well    Behavior During Therapy [facility] for tasks assessed/performed             Past Medical History:  Diagnosis Date   Anxiety    Broken wrist 2008   Cancer [facility])    basal cell   Chest pain 2018   seen by Dr. Ellyn Hack.  Has mid LAD stenosis.   Chest pain    Constipation    GERD (gastroesophageal reflux disease)    occasional uses tums   High cholesterol    In total cholesterol and triglycerides.   High triglycerides    HSV-1 infection    Joint pain    Lichen simplex chronicus    Sleep apnea    Swallowing difficulty    Vitamin D deficiency     Past Surgical History:  Procedure Laterality Date   BREAST BIOPSY  5/12   fibrocystic changes   CYSTOSCOPY N/A 03/09/2017   Procedure: CYSTOSCOPY;  Surgeon: Megan Salon, MD;  Location: [facility];  Service: Gynecology;  Laterality: N/A;   DILATATION & CURETTAGE/HYSTEROSCOPY WITH MYOSURE N/A 12/26/2016   Procedure: [place];  Surgeon: Megan Salon, MD;  Location: [place] [facility];  Service: Gynecology;  Laterality: N/A;   DILATION AND CURETTAGE OF UTERUS     "years ago"   FACIAL COSMETIC SURGERY     HAND SURGERY Right 2012   fractured bone in hands   HARDWARE REMOVAL Left 06/18/2021   Procedure: REMOVAL OF HARDWARE LEFT DISTAL RADIUS FRACTURE;  Surgeon: Sherilyn Cooter, MD;  Location: [place];  Service: Orthopedics;  Laterality: Left;   LAPAROSCOPIC HYSTERECTOMY N/A 03/09/2017   Procedure: HYSTERECTOMY TOTAL LAPAROSCOPIC;  Surgeon: Megan Salon, MD;  Location: [facility];  Service: Gynecology;  Laterality: N/A;   LAPAROSCOPIC SALPINGO OOPHERECTOMY Bilateral 03/09/2017   Procedure: LAPAROSCOPIC SALPINGO OOPHORECTOMY;  Surgeon: Megan Salon, MD;  Location: [facility];  Service: Gynecology;  Laterality: Bilateral;   OPEN REDUCTION INTERNAL FIXATION (ORIF) DISTAL RADIAL FRACTURE Left 04/12/2021   Procedure: OPEN REDUCTION INTERNAL FIXATION (ORIF) LEFT DISTAL RADIUS FRACTURE;  Surgeon: Sherilyn Cooter, MD;  Location: [place];  Service: Orthopedics;  Laterality: Left;   TUBAL LIGATION  1983   WRIST FRACTURE SURGERY Right 2008    There were no vitals filed for this visit.   Subjective Assessment - 07/16/21 0806     Subjective  Pt is TTP at supraspinatus and ratator cuff in general upon brief screen today. She states that this pain has progressed since her initial injury to her left wrist. Recommend that she f/u with an ortho MD to assess her shoulder since her shoulder pain has increased since her injury. L shoulder pain can fluctuate from 1/10-9/10 with range of motion. Rest improves her shoulder pain.  Pertinent History Sleep apnea, anxiety, GERD, Cancer - basal cell, hyperlipidemia    Patient Stated Goals Get as much range of motion as possible    Currently in Pain? Yes    Pain Score 1     Pain Location Wrist    Pain Orientation Left    Pain Descriptors / Indicators Aching;Discomfort    Pain Type Acute pain    Pain Onset More than a month ago    Pain Frequency Intermittent    Multiple Pain Sites Yes    Pain Score 1    Pain Location Shoulder    Pain Orientation Left    Pain Descriptors / Indicators Aching;Discomfort;Sore    Pain Type Acute pain    Pain Radiating Towards Rotator cuff    Pain Onset More than  a month ago    Pain Frequency Intermittent    Aggravating Factors  Increases with activity, tylenol, advil    Pain Relieving Factors rest, non activity                OT Treatments/Exercises (OP) - 07/16/21 0001       ADLs   ADL Comments Pt reports that she has been not wearing the splint during ADL's, work and light activty at home. She reports increased pain in left shoulder that was screened by PT today. Pt is w/ pain in left rotator cuff, specifically her infraspinatous with all activity. Pt states that pain began with initial injury and has been progressively becoming more painful with activty. She was advised by PT to schedule an appointment with ortho MD to r/o injury vs need for therapy/PT. Pt verbalized understanding.      Exercises   Exercises Shoulder;Elbow;Wrist;Hand      Shoulder Exercises: Seated   Other Seated Exercises Deferred until pt f/u with MD YD:XAJOINOM pain      Elbow Exercises   Other elbow exercises Reviewed HEP for Active left elbow extension and flexion. Hold x5 seconds at end range      Wrist Exercises   Other wrist exercises Reviewed and performed passive wrist flexion stretch over edge of table with 2# wt, hold x10 seconds and same with wrist extension with forearm supinated over armrest vs edge of table x10 reps each.    Other wrist exercises Consider forearm pronation and supination next visit using 16oz hammer and elbow flexed at her side at 90*.      Hand Exercises   Other Hand Exercises Verbal review of yellow putty for grip and pinch at home   Reviewed gentle P/ROM wrist flexion/extension and A/AROM for RD/UD at tabletop while holding hand and moving elbow side to side.   Other Hand Exercises Graded clothes pins placing on vertical surface using left hand x18; gripper and 1" blocks picking up x20 and placing into container with moderate difficulty on lowest setting noted. Both to encourage strengthening in L UE.      Splinting   Splinting  Review to continue weaning from splint. She is currently removing for light functional activity and ADL's/bathing etc. She also removes at work and when typing etc. and for all light functional activity at home.      Manual Therapy   Manual Therapy Edema management    Manual therapy comments R UE retrograde massage and gentle P/ROM for edema control x10 min               OT Education - 07/16/21 1011     Education Details Upgraded HEP. 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) Educated Patient    Methods Explanation;Demonstration;Verbal cues    Comprehension Verbalized understanding;Need further instruction              OT Short Term Goals - 07/16/21 1251       OT SHORT TERM GOAL #1   Title Pt will be Mod I splinting use, care and precautions L UE    Time 4    Period Weeks    Status Achieved    Target Date 07/18/21      OT SHORT TERM GOAL #2   Title Pt will be Mod I initial HEP L UE    Time 4    Period Weeks    Status Achieved    Target Date 07/18/21      OT SHORT TERM GOAL #3   Title Pt will be Mod I edema control L UE as seen by I ability to state 2-3 techniques.    Time 4    Period Weeks    Status Achieved    Target Date 07/18/21               OT Long Term Goals - 06/20/21 1746       OT LONG TERM GOAL #1   Title Pt will be Mod I updated HEP for L UE as seen by independent ability to perform in the clinc    Time 8    Period Weeks    Status New    Target Date 08/15/21      OT LONG TERM GOAL #2   Title Pt will be Mod I ADL's using left UE as non-dominant UE as seen by simulated activity in the clinic    Time 8    Period Weeks    Status New    Target Date 08/15/21      OT LONG TERM GOAL #3   Title Pt will demonstrate functional A/ROM L wrist as seen by goniometer assessment within 10*  to R UE    Time 8    Period Weeks    Status New    Target Date 08/15/21      OT LONG TERM GOAL #4    Title Pt will demonstrate increased strength as seen by ability to grip 10# or more with L    Time 8    Period Weeks    Status New    Target Date 08/15/21                Plan - 07/16/21 1233     Clinical Impression Statement Pt presents with c/o increased shoulder pain since her last session. She states that her shoulder pain began with her initial wrist fracture and it has not gotten better but worse. A brief PT screen today idicates  pain in her rotator cuff  (specifically her infraspinatus) and recommended that pt f/u with Ortho MD to r/o a possible tear that may have ocurred during her initial injury/wrist fracture. She had been doing gentle shoulder ROM/stretches as it was thought that she was stiff from being in a cast and then a splint for so long. These were d/c'd today until she f/u with Ortho MD. She may benefit from PT for her shoulder if deemed appropriate by MD. Her HEP for her wrist was upgraded today to include 1-2# weight for gentle passive wrist flexion and extension at edge of table or armrest of  chair. Pt has met 3/3 STG's related to her wrist but continues to have min edema overall and her pain fluctuates but is improving except in her shoulder. Will adjust her program and possible schedule PT based on her f/u with MD for her shoulder.    OT Occupational Profile and History Problem Focused Assessment - Including review of records relating to presenting problem    Occupational performance deficits (Please refer to evaluation for details): ADL's    Body Structure / Function / Physical Skills ADL;Strength;Pain;Edema;UE functional use;ROM;Scar mobility;Coordination;Flexibility;Mobility;Sensation;Decreased knowledge of precautions;FMC    Rehab Potential Good    Clinical Decision Making Limited treatment options, no task modification necessary    Comorbidities Affecting Occupational Performance: May have comorbidities impacting occupational performance    Modification or  Assistance to Complete Evaluation  No modification of tasks or assist necessary to complete eval    OT Frequency Other (comment)   1-2x/week   OT Duration 6 weeks    OT Treatment/Interventions Self-care/ADL training;Fluidtherapy;Splinting;Therapeutic activities;Therapeutic exercise;Scar mobilization;Cryotherapy;Passive range of motion;Manual Therapy;Patient/family education    Plan Continue weaning from splint, UBE x8 min, upgrade HEP as per protocol, edema control and scar management LUE. Focus on LTG's. Check to see if she has new orders for PT for her shoulder    Consulted and Agree with Plan of Care Patient             Patient will benefit from skilled therapeutic intervention in order to improve the following deficits and impairments:   Body Structure / Function / Physical Skills: ADL, Strength, Pain, Edema, UE functional use, ROM, Scar mobility, Coordination, Flexibility, Mobility, Sensation, Decreased knowledge of precautions, [facility]       Visit Diagnosis: Stiffness of left wrist, not elsewhere classified  Muscle weakness (generalized)  Generalized edema  Other lack of coordination  Pain in left wrist    Problem List Patient Active Problem List   Diagnosis Date Noted   H/O: hysterectomy 04/14/2021   Distal radius fracture, left 04/09/2021   OSA (obstructive sleep apnea) 12/11/2020   Polyphagia 10/22/2020   Secondary erythrocytosis 09/14/2020   Class 1 obesity due to excess calories with body mass index (BMI) of 30.0 to 30.9 in adult 08/22/2020   Acid reflux 08/22/2020   Dysphagia 08/22/2020   Globus sensation 05/18/2018   Chest pain with moderate risk for cardiac etiology - concern for coronary spasm 08/26/2016   Dyspareunia, female 50/04/3817   Lichen simplex chronicus 06/01/2015   Hyperlipidemia 06/01/2015   Other and unspecified hyperlipidemia 04/14/2014    Almyra Deforest, OT 07/16/2021, 12:54 PM  [facility] Physical Therapy [address] [place], [place], [address] Phone: [phone]   Fax:  [phone]  Name: Meredith Shannon MRN: [id number] Date of Birth: 11/07/1955

## 2021-07-21 DIAGNOSIS — G4733 Obstructive sleep apnea (adult) (pediatric): Secondary | ICD-10-CM | POA: Diagnosis not present

## 2021-07-23 ENCOUNTER — Other Ambulatory Visit: Payer: Self-pay

## 2021-07-23 ENCOUNTER — Ambulatory Visit: Payer: Self-pay

## 2021-07-23 ENCOUNTER — Ambulatory Visit (INDEPENDENT_AMBULATORY_CARE_PROVIDER_SITE_OTHER): Payer: BC Managed Care – PPO | Admitting: Orthopaedic Surgery

## 2021-07-23 DIAGNOSIS — G8929 Other chronic pain: Secondary | ICD-10-CM

## 2021-07-23 DIAGNOSIS — M25512 Pain in left shoulder: Secondary | ICD-10-CM

## 2021-07-24 NOTE — Progress Notes (Signed)
Office Visit Note   Patient: Meredith Shannon           Date of Birth: [birth year]           MRN: [id number] Visit Date: 07/23/2021              Requested by: Aletha Halim., PA-C [address],  [place] [address] PCP: Aletha Halim., PA-C   Assessment & Plan: Visit Diagnoses:  1. Chronic left shoulder pain     Plan: Impression is chronic left shoulder pain status post fall 4 months ago.  At this point we will need an MR arthrogram of the left shoulder to evaluate for structural normalities.  Follow-up after the MRI.  Follow-Up Instructions: No follow-ups on file.   Orders:  Orders Placed This Encounter  Procedures   XR Shoulder Left   No orders of the defined types were placed in this encounter.     Procedures: No procedures performed   Clinical Data: No additional findings.   Subjective: Chief Complaint  Patient presents with   Left Shoulder - Pain    Meredith Shannon is a 65 year old female who comes in for chronic left shoulder pain since she fell onto an outstretched hand back in August.  She underwent ORIF left distal radius fracture by Dr. Tempie Donning which is subsequently required hardware removal.  She is still dealing with a left wrist as well but she states that since the fall shoulder has continued to be symptomatic.  She has trouble sleeping at night.  She is unable to raise her arm above her shoulder and head.  Denies any neck pain or radiculopathy.   Review of Systems  Constitutional: Negative.   HENT: Negative.    Eyes: Negative.   Respiratory: Negative.    Cardiovascular: Negative.   Endocrine: Negative.   Musculoskeletal: Negative.   Neurological: Negative.   Hematological: Negative.   Psychiatric/Behavioral: Negative.    All other systems reviewed and are negative.   Objective: Vital Signs: LMP 08/04/2006   Physical Exam Vitals and nursing note reviewed.  Constitutional:      Appearance: She is well-developed.  Pulmonary:      Effort: Pulmonary effort is normal.  Skin:    General: Skin is warm.     Capillary Refill: Capillary refill takes less than 2 seconds.  Neurological:     Mental Status: She is alert and oriented to person, place, and time.  Psychiatric:        Behavior: Behavior normal.        Thought Content: Thought content normal.        Judgment: Judgment normal.    Ortho Exam  Left shoulder exam shows moderate limitation in external rotation and internal rotation.  Forward flexion and abduction mildly restricted with moderate pain.  Moderate pain and mild weakness with empty can test.  No significant Hawkins or Neer signs.  Negative Speed.  Biceps is nontender.  AC joint is nontender.  Specialty Comments:  No specialty comments available.  Imaging: XR Shoulder Left  Result Date: 07/24/2021 No acute or structural abnormalities    PMFS History: Patient Active Problem List   Diagnosis Date Noted   H/O: hysterectomy 04/14/2021   Distal radius fracture, left 04/09/2021   OSA (obstructive sleep apnea) 12/11/2020   Polyphagia 10/22/2020   Secondary erythrocytosis 09/14/2020   Class 1 obesity due to excess calories with body mass index (BMI) of 30.0 to 30.9 in adult 08/22/2020   Acid reflux 08/22/2020  Dysphagia 08/22/2020   Globus sensation 05/18/2018   Chest pain with moderate risk for cardiac etiology - concern for coronary spasm 08/26/2016   Dyspareunia, female 02/01/1600   Lichen simplex chronicus 06/01/2015   Hyperlipidemia 06/01/2015   Other and unspecified hyperlipidemia 04/14/2014   Past Medical History:  Diagnosis Date   Anxiety    Broken wrist 2008   Cancer [facility])    basal cell   Chest pain 2018   seen by Dr. Ellyn Hack.  Has mid LAD stenosis.   Chest pain    Constipation    GERD (gastroesophageal reflux disease)    occasional uses tums   High cholesterol    In total cholesterol and triglycerides.   High triglycerides    HSV-1 infection    Joint pain    Lichen simplex  chronicus    Sleep apnea    Swallowing difficulty    Vitamin D deficiency     Family History  Problem Relation Age of Onset   Osteoporosis Mother    High blood pressure Mother    High Cholesterol Mother    Depression Mother    Sudden death Father    Alcohol abuse Father     Past Surgical History:  Procedure Laterality Date   BREAST BIOPSY  5/12   fibrocystic changes   CYSTOSCOPY N/A 03/09/2017   Procedure: CYSTOSCOPY;  Surgeon: Megan Salon, MD;  Location: [facility];  Service: Gynecology;  Laterality: N/A;   DILATATION & CURETTAGE/HYSTEROSCOPY WITH MYOSURE N/A 12/26/2016   Procedure: [place];  Surgeon: Megan Salon, MD;  Location: [facility];  Service: Gynecology;  Laterality: N/A;   DILATION AND CURETTAGE OF UTERUS     "years ago"   FACIAL COSMETIC SURGERY     HAND SURGERY Right 2012   fractured bone in hands   HARDWARE REMOVAL Left 06/18/2021   Procedure: REMOVAL OF HARDWARE LEFT DISTAL RADIUS FRACTURE;  Surgeon: Sherilyn Cooter, MD;  Location: [place];  Service: Orthopedics;  Laterality: Left;   LAPAROSCOPIC HYSTERECTOMY N/A 03/09/2017   Procedure: HYSTERECTOMY TOTAL LAPAROSCOPIC;  Surgeon: Megan Salon, MD;  Location: [facility];  Service: Gynecology;  Laterality: N/A;   LAPAROSCOPIC SALPINGO OOPHERECTOMY Bilateral 03/09/2017   Procedure: LAPAROSCOPIC SALPINGO OOPHORECTOMY;  Surgeon: Megan Salon, MD;  Location: [facility];  Service: Gynecology;  Laterality: Bilateral;   OPEN REDUCTION INTERNAL FIXATION (ORIF) DISTAL RADIAL FRACTURE Left 04/12/2021   Procedure: OPEN REDUCTION INTERNAL FIXATION (ORIF) LEFT DISTAL RADIUS FRACTURE;  Surgeon: Sherilyn Cooter, MD;  Location: [place];  Service: Orthopedics;  Laterality: Left;   TUBAL LIGATION  1983   WRIST FRACTURE SURGERY Right 2008   Social History   Occupational History   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

## 2021-07-25 ENCOUNTER — Telehealth: Payer: Self-pay | Admitting: Orthopaedic Surgery

## 2021-07-25 ENCOUNTER — Ambulatory Visit (INDEPENDENT_AMBULATORY_CARE_PROVIDER_SITE_OTHER): Payer: BC Managed Care – PPO | Admitting: Occupational Therapy

## 2021-07-25 ENCOUNTER — Other Ambulatory Visit: Payer: Self-pay

## 2021-07-25 DIAGNOSIS — M25532 Pain in left wrist: Secondary | ICD-10-CM | POA: Diagnosis not present

## 2021-07-25 DIAGNOSIS — M6281 Muscle weakness (generalized): Secondary | ICD-10-CM

## 2021-07-25 DIAGNOSIS — M25632 Stiffness of left wrist, not elsewhere classified: Secondary | ICD-10-CM | POA: Diagnosis not present

## 2021-07-25 NOTE — Therapy (Signed)
[facility] [address] [place], [place], [address] Phone: [phone]   Fax:  [phone]  Occupational Therapy Treatment  Patient Details  Name: Meredith Shannon MRN: [id number] Date of Birth: 11-09-1955 Referring Provider (OT): [place]   Encounter Date: 07/25/2021   OT End of Session - 07/25/21 0949     Visit Number 5    Number of Visits 8    Date for OT Re-Evaluation 08/13/21    Authorization Type [employer] PPO    OT Start Time 0848    OT Stop Time 0935    OT Time Calculation (min) 47 min    Activity Tolerance Patient tolerated treatment well    Behavior During Therapy [facility] for tasks assessed/performed             Past Medical History:  Diagnosis Date   Anxiety    Broken wrist 2008   Cancer [facility])    basal cell   Chest pain 2018   seen by Dr. Ellyn Hack.  Has mid LAD stenosis.   Chest pain    Constipation    GERD (gastroesophageal reflux disease)    occasional uses tums   High cholesterol    In total cholesterol and triglycerides.   High triglycerides    HSV-1 infection    Joint pain    Lichen simplex chronicus    Sleep apnea    Swallowing difficulty    Vitamin D deficiency     Past Surgical History:  Procedure Laterality Date   BREAST BIOPSY  5/12   fibrocystic changes   CYSTOSCOPY N/A 03/09/2017   Procedure: CYSTOSCOPY;  Surgeon: Megan Salon, MD;  Location: [facility];  Service: Gynecology;  Laterality: N/A;   DILATATION & CURETTAGE/HYSTEROSCOPY WITH MYOSURE N/A 12/26/2016   Procedure: [place];  Surgeon: Megan Salon, MD;  Location: [facility];  Service: Gynecology;  Laterality: N/A;   DILATION AND CURETTAGE OF UTERUS     "years ago"   FACIAL COSMETIC SURGERY     HAND SURGERY Right 2012   fractured bone in hands   HARDWARE REMOVAL Left 06/18/2021   Procedure: REMOVAL OF HARDWARE LEFT DISTAL RADIUS FRACTURE;  Surgeon: Sherilyn Cooter, MD;  Location: [place] [facility];  Service: Orthopedics;  Laterality: Left;   LAPAROSCOPIC HYSTERECTOMY N/A 03/09/2017   Procedure: HYSTERECTOMY TOTAL LAPAROSCOPIC;  Surgeon: Megan Salon, MD;  Location: [facility];  Service: Gynecology;  Laterality: N/A;   LAPAROSCOPIC SALPINGO OOPHERECTOMY Bilateral 03/09/2017   Procedure: LAPAROSCOPIC SALPINGO OOPHORECTOMY;  Surgeon: Megan Salon, MD;  Location: [facility];  Service: Gynecology;  Laterality: Bilateral;   OPEN REDUCTION INTERNAL FIXATION (ORIF) DISTAL RADIAL FRACTURE Left 04/12/2021   Procedure: OPEN REDUCTION INTERNAL FIXATION (ORIF) LEFT DISTAL RADIUS FRACTURE;  Surgeon: Sherilyn Cooter, MD;  Location: [place];  Service: Orthopedics;  Laterality: Left;   TUBAL LIGATION  1983   WRIST FRACTURE SURGERY Right 2008    There were no vitals filed for this visit.   Subjective Assessment - 07/25/21 0849     Subjective  Order for MRI was placed on 07/23/21 for shoulder - MD wants follow up visit after MRI    Pertinent History ORIF Lt DRF ON 04/12/21; hardware removal on 06/18/21. PMH: Sleep apnea, anxiety, GERD, Cancer - basal cell, hyperlipidemia    Patient Stated Goals Get as much range of motion as possible    Currently in Pain? Yes    Pain Score  2    up to 5/10   Pain Location Wrist    Pain Orientation Right    Pain Descriptors / Indicators Aching;Throbbing    Pain Type Acute pain    Pain Onset More than a month ago    Pain Frequency Constant    Aggravating Factors  A/ROM, movement, activity    Pain Relieving Factors rest, repositioning    Pain Onset More than a month ago                [facility] OT Assessment - 07/25/21 0001       AROM   Left Wrist Extension 40 Degrees    Left Wrist Flexion 40 Degrees      Hand Function   Right Hand Grip (lbs) 62 lbs    Left Hand Grip (lbs) 19 lbs              Fluidotherapy x 12 min Lt wrist to decrease stiffness and pain.   Pt issued updated HEP  for P/ROM and strengthening Lt wrist - see pt instructions. Pt demo each.  UBE x 5 min, level 3 for UB conditioning - no pain in shoulder reported with this   (Pt reports no one has called yet to schedule MRI - Pt instructed to call MD if she does not hear back as of tomorrow)             OT Education - 07/25/21 0932     Education Details Wrist P/ROM and strengthening HEP    Person(s) Educated Patient    Methods Explanation;Demonstration;Verbal cues    Comprehension Verbalized understanding;Returned demonstration              OT Short Term Goals - 07/16/21 1251       OT SHORT TERM GOAL #1   Title Pt will be Mod I splinting use, care and precautions L UE    Time 4    Period Weeks    Status Achieved    Target Date 07/18/21      OT SHORT TERM GOAL #2   Title Pt will be Mod I initial HEP L UE    Time 4    Period Weeks    Status Achieved    Target Date 07/18/21      OT SHORT TERM GOAL #3   Title Pt will be Mod I edema control L UE as seen by I ability to state 2-3 techniques.    Time 4    Period Weeks    Status Achieved    Target Date 07/18/21               OT Long Term Goals - 06/20/21 1746       OT LONG TERM GOAL #1   Title Pt will be Mod I updated HEP for L UE as seen by independent ability to perform in the clinc    Time 8    Period Weeks    Status New    Target Date 08/15/21      OT LONG TERM GOAL #2   Title Pt will be Mod I ADL's using left UE as non-dominant UE as seen by simulated activity in the clinic    Time 8    Period Weeks    Status New    Target Date 08/15/21      OT LONG TERM GOAL #3   Title Pt will demonstrate functional A/ROM L wrist as seen by goniometer assessment within 10*  to  R UE    Time 8    Period Weeks    Status New    Target Date 08/15/21      OT LONG TERM GOAL #4   Title Pt will demonstrate increased strength as seen by ability to grip 10# or more with L    Time 8    Period Weeks    Status New    Target  Date 08/15/21                   Plan - 07/25/21 0950     Clinical Impression Statement Pt progressing with ROM - limited by pain and stiffness Lt wrist, and Lt shoulder pain - referral for MRI of shoulder placed on 07/23/21 however pt is still waiting to get it scheduled    OT Occupational Profile and History Problem Focused Assessment - Including review of records relating to presenting problem    Occupational performance deficits (Please refer to evaluation for details): ADL's    Body Structure / Function / Physical Skills ADL;Strength;Pain;Edema;UE functional use;ROM;Scar mobility;Coordination;Flexibility;Mobility;Sensation;Decreased knowledge of precautions;FMC    Rehab Potential Good    Clinical Decision Making Limited treatment options, no task modification necessary    Comorbidities Affecting Occupational Performance: May have comorbidities impacting occupational performance    Modification or Assistance to Complete Evaluation  No modification of tasks or assist necessary to complete eval    OT Frequency Other (comment)   1-2x/week   OT Duration 6 weeks    OT Treatment/Interventions Self-care/ADL training;Fluidtherapy;Splinting;Therapeutic activities;Therapeutic exercise;Scar mobilization;Cryotherapy;Passive range of motion;Manual Therapy;Patient/family education    Plan continue fluido, ROM and strengthening Lt wrist, gripper for Lt hand strength, UBE, follow up w/ MRI of shoulder and possible P.T. referral   Consulted and Agree with Plan of Care Patient             Patient will benefit from skilled therapeutic intervention in order to improve the following deficits and impairments:   Body Structure / Function / Physical Skills: ADL, Strength, Pain, Edema, UE functional use, ROM, Scar mobility, Coordination, Flexibility, Mobility, Sensation, Decreased knowledge of precautions, [facility]       Visit Diagnosis: Stiffness of left wrist, not elsewhere classified  Muscle  weakness (generalized)  Pain in left wrist    Problem List Patient Active Problem List   Diagnosis Date Noted   H/O: hysterectomy 04/14/2021   Distal radius fracture, left 04/09/2021   OSA (obstructive sleep apnea) 12/11/2020   Polyphagia 10/22/2020   Secondary erythrocytosis 09/14/2020   Class 1 obesity due to excess calories with body mass index (BMI) of 30.0 to 30.9 in adult 08/22/2020   Acid reflux 08/22/2020   Dysphagia 08/22/2020   Globus sensation 05/18/2018   Chest pain with moderate risk for cardiac etiology - concern for coronary spasm 08/26/2016   Dyspareunia, female 53/66/4403   Lichen simplex chronicus 06/01/2015   Hyperlipidemia 06/01/2015   Other and unspecified hyperlipidemia 04/14/2014    Carey Bullocks, OTR/L 07/25/2021, 9:53 AM  [facility] [address] [place], [place], [address] Phone: [phone]   Fax:  [phone]  Name: ISABELLAMARIE RANDA MRN: [id number] Date of Birth: [birth year]

## 2021-07-25 NOTE — Telephone Encounter (Signed)
Sw pt and informed her we are now waiting on auth from her insurance

## 2021-07-25 NOTE — Patient Instructions (Addendum)
Composite Extension (Passive Flexor Stretch)    Sitting with elbows on table and palms together, slowly lower wrists toward table until stretch is felt. Be sure to keep palms together throughout stretch. Hold __10__ seconds. Relax. Repeat __5__ times. Do _3-4___ sessions per day.  Wrist Passive Flexion    Rest right forearm on table, hand palm-down over edge. Bend wrist by pressing hand down with other hand. Hold _10___ seconds. Repeat _5___ times. Do _3-4___ sessions per day.  Wrist Extension: Resisted    With left palm down, _1-2___ pound weight in hand, bend wrist up. Return slowly. Repeat __10__ times per set. Do __2__ sessions per day.  Wrist Flexion: Resisted    With left palm up, _1-2___ pound weight in hand, bend wrist up. Return slowly. Repeat _10___ times per set.  Do __2__ sessions per day.  Wrist Radial Deviation: Resisted    With left thumb up, _1-2___ pound weight in hand, bend wrist up. Return slowly. Repeat _10___ times per set. Do __2__ sessions per day.

## 2021-07-25 NOTE — Telephone Encounter (Signed)
Pt was in the office and is asking for an update on her MRI referral.   CB [phone]

## 2021-08-01 ENCOUNTER — Other Ambulatory Visit: Payer: Self-pay

## 2021-08-01 ENCOUNTER — Ambulatory Visit (INDEPENDENT_AMBULATORY_CARE_PROVIDER_SITE_OTHER): Payer: BC Managed Care – PPO | Admitting: Occupational Therapy

## 2021-08-01 ENCOUNTER — Ambulatory Visit
Admission: RE | Admit: 2021-08-01 | Discharge: 2021-08-01 | Disposition: A | Discharge status: Home / Self Care | Payer: BC Managed Care – PPO | Source: Ambulatory Visit | Attending: Orthopaedic Surgery | Admitting: Orthopaedic Surgery

## 2021-08-01 DIAGNOSIS — M6281 Muscle weakness (generalized): Secondary | ICD-10-CM

## 2021-08-01 DIAGNOSIS — M25512 Pain in left shoulder: Secondary | ICD-10-CM

## 2021-08-01 DIAGNOSIS — M25632 Stiffness of left wrist, not elsewhere classified: Secondary | ICD-10-CM

## 2021-08-01 DIAGNOSIS — G8929 Other chronic pain: Secondary | ICD-10-CM | POA: Diagnosis not present

## 2021-08-01 DIAGNOSIS — M25532 Pain in left wrist: Secondary | ICD-10-CM

## 2021-08-01 DIAGNOSIS — M67814 Other specified disorders of tendon, left shoulder: Secondary | ICD-10-CM | POA: Diagnosis not present

## 2021-08-01 MED ORDER — IOPAMIDOL (ISOVUE-M 200) INJECTION 41%
13.0000 mL | Freq: Once | INTRAMUSCULAR | Status: AC
  Administered 2021-08-01: 12:00:00 13 mL via INTRA_ARTICULAR

## 2021-08-01 NOTE — Therapy (Signed)
[facility] [address] [place], [place], [address] Phone: [phone]   Fax:  [phone]  Occupational Therapy Treatment  Patient Details  Name: Meredith Shannon MRN: [id number] Date of Birth: 05-03-1956 Referring Provider (OT): [place]   Encounter Date: 08/01/2021   OT End of Session - 08/01/21 1125     Visit Number 6    Number of Visits 8    Date for OT Re-Evaluation 08/13/21    Authorization Type [employer] Comm PPO    OT Start Time 0845    OT Stop Time 0933    OT Time Calculation (min) 48 min    Activity Tolerance Patient tolerated treatment well    Behavior During Therapy [facility] for tasks assessed/performed             Past Medical History:  Diagnosis Date   Anxiety    Broken wrist 2008   Cancer [facility])    basal cell   Chest pain 2018   seen by Dr. Ellyn Hack.  Has mid LAD stenosis.   Chest pain    Constipation    GERD (gastroesophageal reflux disease)    occasional uses tums   High cholesterol    In total cholesterol and triglycerides.   High triglycerides    HSV-1 infection    Joint pain    Lichen simplex chronicus    Sleep apnea    Swallowing difficulty    Vitamin D deficiency     Past Surgical History:  Procedure Laterality Date   BREAST BIOPSY  5/12   fibrocystic changes   CYSTOSCOPY N/A 03/09/2017   Procedure: CYSTOSCOPY;  Surgeon: Megan Salon, MD;  Location: [facility];  Service: Gynecology;  Laterality: N/A;   DILATATION & CURETTAGE/HYSTEROSCOPY WITH MYOSURE N/A 12/26/2016   Procedure: [place];  Surgeon: Megan Salon, MD;  Location: [facility];  Service: Gynecology;  Laterality: N/A;   DILATION AND CURETTAGE OF UTERUS     "years ago"   FACIAL COSMETIC SURGERY     HAND SURGERY Right 2012   fractured bone in hands   HARDWARE REMOVAL Left 06/18/2021   Procedure: REMOVAL OF HARDWARE LEFT DISTAL RADIUS FRACTURE;  Surgeon: Sherilyn Cooter, MD;  Location: [place];  Service: Orthopedics;  Laterality: Left;   LAPAROSCOPIC HYSTERECTOMY N/A 03/09/2017   Procedure: HYSTERECTOMY TOTAL LAPAROSCOPIC;  Surgeon: Megan Salon, MD;  Location: [facility];  Service: Gynecology;  Laterality: N/A;   LAPAROSCOPIC SALPINGO OOPHERECTOMY Bilateral 03/09/2017   Procedure: LAPAROSCOPIC SALPINGO OOPHORECTOMY;  Surgeon: Megan Salon, MD;  Location: [facility];  Service: Gynecology;  Laterality: Bilateral;   OPEN REDUCTION INTERNAL FIXATION (ORIF) DISTAL RADIAL FRACTURE Left 04/12/2021   Procedure: OPEN REDUCTION INTERNAL FIXATION (ORIF) LEFT DISTAL RADIUS FRACTURE;  Surgeon: Sherilyn Cooter, MD;  Location: [place];  Service: Orthopedics;  Laterality: Left;   TUBAL LIGATION  1983   WRIST FRACTURE SURGERY Right 2008    There were no vitals filed for this visit.   Subjective Assessment - 08/01/21 0850     Subjective  I have my MRI today for my Lt shoulder.    Pertinent History ORIF Lt DRF ON 04/12/21; hardware removal on 06/18/21. PMH: Sleep apnea, anxiety, GERD, Cancer - basal cell, hyperlipidemia    Patient Stated Goals Get as much range of motion as possible    Currently in Pain? Yes    Pain Score 2     Pain Location Wrist  Pain Orientation Left    Pain Descriptors / Indicators Aching;Throbbing    Pain Onset More than a month ago    Pain Frequency Constant    Aggravating Factors  A/ROM, movement, activity    Pain Relieving Factors rest, repositioning    Pain Onset More than a month ago                         OT Treatments/Exercises (OP) - 08/01/21 0001       Shoulder Exercises: ROM/Strengthening   UBE (Upper Arm Bike) UBE x 5 min, level 3    Other ROM/Strengthening Exercises Pt issued shoulder ROM HEP in supine to maintain ROM w/o causing pain (pt reports previous wall climbs cause pain - pt instructed to d/c)      Wrist Exercises   Other wrist exercises Reviewed most recent  HEP for wrist P/ROM and strengthening      Hand Exercises   Other Hand Exercises Gripper set at level 2 resistance (black coil) w/ coban wrap to pick up blocks for Lt grip strength but w/ max drops. Reduced back down to level 1 resistance w/ min drops/difficulty      Modalities   Modalities Fluidotherapy      LUE Fluidotherapy   Number Minutes Fluidotherapy 10 Minutes    LUE Fluidotherapy Location Hand;Wrist    Comments at beginning of session to decrease pain/stiffness                    OT Education - 08/01/21 0930     Education Details shoulder ROM HEP    Person(s) Educated Patient    Methods Explanation;Demonstration;Verbal cues;Handout    Comprehension Verbalized understanding;Returned demonstration              OT Short Term Goals - 07/16/21 1251       OT SHORT TERM GOAL #1   Title Pt will be Mod I splinting use, care and precautions L UE    Time 4    Period Weeks    Status Achieved    Target Date 07/18/21      OT SHORT TERM GOAL #2   Title Pt will be Mod I initial HEP L UE    Time 4    Period Weeks    Status Achieved    Target Date 07/18/21      OT SHORT TERM GOAL #3   Title Pt will be Mod I edema control L UE as seen by I ability to state 2-3 techniques.    Time 4    Period Weeks    Status Achieved    Target Date 07/18/21               OT Long Term Goals - 08/01/21 1126       OT LONG TERM GOAL #1   Title Pt will be Mod I updated HEP for L UE as seen by independent ability to perform in the clinc    Time 8    Period Weeks    Status On-going    Target Date 08/15/21      OT LONG TERM GOAL #2   Title Pt will be Mod I ADL's using left UE as non-dominant UE as seen by simulated activity in the clinic    Time 8    Period Weeks    Status On-going    Target Date 08/15/21      OT LONG TERM GOAL #3  Title Pt will demonstrate functional A/ROM L wrist as seen by goniometer assessment within 10*  to R UE    Time 8    Period Weeks     Status On-going    Target Date 08/15/21      OT LONG TERM GOAL #4   Title Pt will demonstrate increased strength as seen by ability to grip 10# or more with L    Time 8    Period Weeks    Status On-going    Target Date 08/15/21                   Plan - 08/01/21 1126     Clinical Impression Statement Pt progressing with ROM - limited by pain and stiffness Lt wrist, and Lt shoulder pain (MRI scheduled today)    OT Occupational Profile and History Problem Focused Assessment - Including review of records relating to presenting problem    Occupational performance deficits (Please refer to evaluation for details): ADL's    Body Structure / Function / Physical Skills ADL;Strength;Pain;Edema;UE functional use;ROM;Scar mobility;Coordination;Flexibility;Mobility;Sensation;Decreased knowledge of precautions;FMC    Rehab Potential Good    Clinical Decision Making Limited treatment options, no task modification necessary    Comorbidities Affecting Occupational Performance: May have comorbidities impacting occupational performance    Modification or Assistance to Complete Evaluation  No modification of tasks or assist necessary to complete eval    OT Frequency Other (comment)   1-2x/week   OT Duration 6 weeks    OT Treatment/Interventions Self-care/ADL training;Fluidtherapy;Splinting;Therapeutic activities;Therapeutic exercise;Scar mobilization;Cryotherapy;Passive range of motion;Manual Therapy;Patient/family education    Plan continue fluido, ROM and strengthening Lt wrist,  Lt hand strengthening, UBE, follow up w/ MRI results    Consulted and Agree with Plan of Care Patient             Patient will benefit from skilled therapeutic intervention in order to improve the following deficits and impairments:   Body Structure / Function / Physical Skills: ADL, Strength, Pain, Edema, UE functional use, ROM, Scar mobility, Coordination, Flexibility, Mobility, Sensation, Decreased knowledge of  precautions, [facility]       Visit Diagnosis: Stiffness of left wrist, not elsewhere classified  Muscle weakness (generalized)  Pain in left wrist    Problem List Patient Active Problem List   Diagnosis Date Noted   H/O: hysterectomy 04/14/2021   Distal radius fracture, left 04/09/2021   OSA (obstructive sleep apnea) 12/11/2020   Polyphagia 10/22/2020   Secondary erythrocytosis 09/14/2020   Class 1 obesity due to excess calories with body mass index (BMI) of 30.0 to 30.9 in adult 08/22/2020   Acid reflux 08/22/2020   Dysphagia 08/22/2020   Globus sensation 05/18/2018   Chest pain with moderate risk for cardiac etiology - concern for coronary spasm 08/26/2016   Dyspareunia, female 25/00/3704   Lichen simplex chronicus 06/01/2015   Hyperlipidemia 06/01/2015   Other and unspecified hyperlipidemia 04/14/2014    Carey Bullocks, OTR/L 08/01/2021, 11:33 AM  [facility] Physical Therapy [address] [place], [place], [address] Phone: [phone]   Fax:  [phone]  Name: Meredith Shannon MRN: [id number] Date of Birth: 11-24-1955

## 2021-08-01 NOTE — Patient Instructions (Signed)
Flexion (Assistive)    Hold Lt wrist w/ thumb side up and raise arms above head, keeping elbows as straight as possible. Can be done sitting or lying. Repeat _10___ times. Do __2-3__ sessions per day.  SHOULDER: Flexion - Supine (Cane)    Hold paper towel roll in both hands. Raise arms up overhead. Do not allow back to arch. 10__ reps per set, _2-3__ sets per day,

## 2021-08-06 ENCOUNTER — Telehealth: Payer: Self-pay | Admitting: Orthopedic Surgery

## 2021-08-06 NOTE — Telephone Encounter (Signed)
Received vm from pt requesting copy of records. IC, spoke with patient advised that we need signed auth before can release records. Once we have that, I will call when copy is ready. If we get release today, told pt probably can have ready Wed or Thur. Pts ph [phone]

## 2021-08-08 ENCOUNTER — Encounter: Payer: Self-pay | Admitting: Occupational Therapy

## 2021-08-08 ENCOUNTER — Encounter: Payer: Self-pay | Admitting: Orthopedic Surgery

## 2021-08-08 ENCOUNTER — Ambulatory Visit (INDEPENDENT_AMBULATORY_CARE_PROVIDER_SITE_OTHER): Payer: BC Managed Care – PPO | Admitting: Occupational Therapy

## 2021-08-08 ENCOUNTER — Other Ambulatory Visit: Payer: Self-pay

## 2021-08-08 ENCOUNTER — Ambulatory Visit: Payer: BC Managed Care – PPO | Admitting: Orthopedic Surgery

## 2021-08-08 VITALS — BP 132/85 | HR 85

## 2021-08-08 DIAGNOSIS — M6281 Muscle weakness (generalized): Secondary | ICD-10-CM

## 2021-08-08 DIAGNOSIS — R601 Generalized edema: Secondary | ICD-10-CM

## 2021-08-08 DIAGNOSIS — R278 Other lack of coordination: Secondary | ICD-10-CM

## 2021-08-08 DIAGNOSIS — M25532 Pain in left wrist: Secondary | ICD-10-CM | POA: Diagnosis not present

## 2021-08-08 DIAGNOSIS — S52572A Other intraarticular fracture of lower end of left radius, initial encounter for closed fracture: Secondary | ICD-10-CM

## 2021-08-08 DIAGNOSIS — M25632 Stiffness of left wrist, not elsewhere classified: Secondary | ICD-10-CM | POA: Diagnosis not present

## 2021-08-08 NOTE — Therapy (Signed)
[facility] Physical Therapy [address] [place], [place], [address] Phone: [phone]   Fax:  [phone]  Occupational Therapy Treatment  Patient Details  Name: Meredith Shannon MRN: [id number] Date of Birth: 1955/09/19 Referring Provider (OT): [place]   Encounter Date: 08/08/2021   OT End of Session - 08/08/21 1729     Visit Number 7    Number of Visits 8    Date for OT Re-Evaluation 08/15/21    Authorization Type [employer] Comm PPO    Progress Note Due on Visit 10    OT Start Time 1347    OT Stop Time 1432    OT Time Calculation (min) 45 min    Activity Tolerance Patient tolerated treatment well    Behavior During Therapy [facility] for tasks assessed/performed             Past Medical History:  Diagnosis Date   Anxiety    Broken wrist 2008   Cancer [facility])    basal cell   Chest pain 2018   seen by Dr. Ellyn Hack.  Has mid LAD stenosis.   Chest pain    Constipation    GERD (gastroesophageal reflux disease)    occasional uses tums   High cholesterol    In total cholesterol and triglycerides.   High triglycerides    HSV-1 infection    Joint pain    Lichen simplex chronicus    Sleep apnea    Swallowing difficulty    Vitamin D deficiency     Past Surgical History:  Procedure Laterality Date   BREAST BIOPSY  5/12   fibrocystic changes   CYSTOSCOPY N/A 03/09/2017   Procedure: CYSTOSCOPY;  Surgeon: Megan Salon, MD;  Location: [facility];  Service: Gynecology;  Laterality: N/A;   DILATATION & CURETTAGE/HYSTEROSCOPY WITH MYOSURE N/A 12/26/2016   Procedure: [place];  Surgeon: Megan Salon, MD;  Location: [place] [facility];  Service: Gynecology;  Laterality: N/A;   DILATION AND CURETTAGE OF UTERUS     "years ago"   FACIAL COSMETIC SURGERY     HAND SURGERY Right 2012   fractured bone in hands   HARDWARE REMOVAL Left 06/18/2021   Procedure: REMOVAL OF HARDWARE LEFT DISTAL RADIUS FRACTURE;  Surgeon:  Sherilyn Cooter, MD;  Location: [place];  Service: Orthopedics;  Laterality: Left;   LAPAROSCOPIC HYSTERECTOMY N/A 03/09/2017   Procedure: HYSTERECTOMY TOTAL LAPAROSCOPIC;  Surgeon: Megan Salon, MD;  Location: [facility];  Service: Gynecology;  Laterality: N/A;   LAPAROSCOPIC SALPINGO OOPHERECTOMY Bilateral 03/09/2017   Procedure: LAPAROSCOPIC SALPINGO OOPHORECTOMY;  Surgeon: Megan Salon, MD;  Location: [facility];  Service: Gynecology;  Laterality: Bilateral;   OPEN REDUCTION INTERNAL FIXATION (ORIF) DISTAL RADIAL FRACTURE Left 04/12/2021   Procedure: OPEN REDUCTION INTERNAL FIXATION (ORIF) LEFT DISTAL RADIUS FRACTURE;  Surgeon: Sherilyn Cooter, MD;  Location: [place];  Service: Orthopedics;  Laterality: Left;   TUBAL LIGATION  1983   WRIST FRACTURE SURGERY Right 2008    There were no vitals filed for this visit.   Subjective Assessment - 08/08/21 1349     Subjective  Pt reports that she is waiting on her MRI results from Dr Erlinda Hong. Brief review of MRI results appear to report mild supraspinatus tendonitis per pt report.  Pt advised to await f/u with MD.    Pertinent History ORIF Lt DRF ON 04/12/21; hardware removal on 06/18/21. PMH: Sleep apnea, anxiety, GERD, Cancer - basal cell,  hyperlipidemia    Patient Stated Goals Get as much range of motion as possible.    Currently in Pain? Yes    Pain Score 2     Pain Location Wrist    Pain Orientation Left    Pain Descriptors / Indicators Aching;Throbbing    Pain Type Acute pain    Pain Onset More than a month ago    Pain Frequency Constant    Multiple Pain Sites No                [facility] OT Assessment - 08/08/21 0001       AROM   AROM Assessment Site Wrist;Forearm   Active supination left = 82*, active pronation = 89*   Right/Left Shoulder Left    Left Wrist Extension 44 Degrees    Left Wrist Flexion 54 Degrees                OT Treatments/Exercises (OP) -  08/08/21 0001       ADLs   ADL Comments Pt reports frustration with pain in shoulder, wrist and hand. She is Mod I ADL's and daily work activity but cont to report pain in shoulder that is distracting and interferring with daily activity and quality of life per her report. Pt was encouraged to cont with HEP for wrist and hand and try shoulder ex's in supine to alleviate some symptoms. Also discussed possible sleeping positions to encourage elbow extension and for shoulder comfort.      Exercises   Exercises Shoulder;Elbow;Wrist;Hand      Shoulder Exercises: Supine   Other Supine Exercises Verbal review of shoulder HEP in supine. Pt reports that she has not been ding these as often as she should. MRI results revel tendinitis of supraspinatus tendon but pt is waiting on f/u visit with Dr Erlinda Hong. She may benefit from OP PT t address this as well.      Shoulder Exercises: Seated   Other Seated Exercises Deferred until pt f/u with MD WR:[id number] pain      Elbow Exercises   Other elbow exercises Reviewed HEP and performed active and A/AROM for elbow extension as it appears that pt has been keeping her L UE in protected position and has stiffness.      Additional Elbow Exercises   UBE (Upper Arm Bike) UBE level 3 rotating forward and back for A/ROM to bilateral UE's elbow, wrist and hands for grip.   x8 min     Wrist Exercises   Other wrist exercises Reviewed most recent HEP for wrist P/ROM and strengthening using 2 & 3# weights.    Other wrist exercises A/AROM and gentle P/ROM for forearm pronation/supination left      Hand Exercises   Other Hand Exercises Upgraded HEP for grip with red theraputty for increased resistance. Pt performed in clinic      Modalities   Modalities Fluidotherapy      LUE Fluidotherapy   Number Minutes Fluidotherapy 10 Minutes    LUE Fluidotherapy Location Hand;Wrist    Comments While performing A/ROM to digits, wrist and hand and to assist with increased A/ROM,  flexibility in heat for the duration of this session.      Manual Therapy   Manual Therapy Edema management    Manual therapy comments R UE retrograde massage and gentle P/ROM to digits, wrist and hand for flex/exten, RD/UD, finger flex/exten. and for edema control x10 min  OT Education - 08/08/21 1729     Education Details HEP for elbow, wrist and hand left, edema control    Person(s) Educated Patient    Methods Explanation;Demonstration;Verbal cues    Comprehension Verbalized understanding;Returned demonstration              OT Short Term Goals - 07/16/21 1251       OT SHORT TERM GOAL #1   Title Pt will be Mod I splinting use, care and precautions L UE    Time 4    Period Weeks    Status Achieved    Target Date 07/18/21      OT SHORT TERM GOAL #2   Title Pt will be Mod I initial HEP L UE    Time 4    Period Weeks    Status Achieved    Target Date 07/18/21      OT SHORT TERM GOAL #3   Title Pt will be Mod I edema control L UE as seen by I ability to state 2-3 techniques.    Time 4    Period Weeks    Status Achieved    Target Date 07/18/21               OT Long Term Goals - 08/08/21 1740       OT LONG TERM GOAL #1   Title Pt will be Mod I updated HEP for L UE as seen by independent ability to perform in the clinc    Time 8    Period Weeks    Status On-going    Target Date 08/15/21      OT LONG TERM GOAL #2   Title Pt will be Mod I ADL's using left UE as non-dominant UE as seen by simulated activity in the clinic    Time 8    Period Weeks    Status On-going    Target Date 08/15/21      OT LONG TERM GOAL #3   Title Pt will demonstrate functional A/ROM L wrist as seen by goniometer assessment within 10*  to R UE    Time 8    Period Weeks    Status On-going    Target Date 08/15/21      OT LONG TERM GOAL #4   Title Pt will demonstrate increased strength as seen by ability to grip 10# or more with L    Time 8    Period Weeks     Status On-going    Target Date 08/15/21                Plan - 08/08/21 1735     Clinical Impression Statement Pt making slow steady progression with A/ROM left hand and wrist, but cont to experience limitaitons from shoulder pain and overall stiffness of L UE. Upgraded HEP for red putty for increased resistance with grip. Pt is awaiting f/u appointment w/ Dr Erlinda Hong re: MRI but pt reports mild tendonitis of supraspinatus. She may also benefit from PT for left shoulder pain.    OT Occupational Profile and History Problem Focused Assessment - Including review of records relating to presenting problem    Occupational performance deficits (Please refer to evaluation for details): ADL's    Body Structure / Function / Physical Skills ADL;Strength;Pain;Edema;UE functional use;ROM;Scar mobility;Coordination;Flexibility;Mobility;Sensation;Decreased knowledge of precautions;FMC    Rehab Potential Good    Clinical Decision Making Limited treatment options, no task modification necessary    Comorbidities Affecting Occupational Performance: May have  comorbidities impacting occupational performance    Modification or Assistance to Complete Evaluation  No modification of tasks or assist necessary to complete eval    OT Frequency Other (comment)   1-2x/week   OT Duration 6 weeks    OT Treatment/Interventions Self-care/ADL training;Fluidtherapy;Splinting;Therapeutic activities;Therapeutic exercise;Scar mobilization;Cryotherapy;Passive range of motion;Manual Therapy;Patient/family education    Plan Focus on LTG's, ROM and strengthening Lt wrist & hand, UBE, ? PT referral for shoulder pain.    Consulted and Agree with Plan of Care Patient             Patient will benefit from skilled therapeutic intervention in order to improve the following deficits and impairments:   Body Structure / Function / Physical Skills: ADL, Strength, Pain, Edema, UE functional use, ROM, Scar mobility, Coordination,  Flexibility, Mobility, Sensation, Decreased knowledge of precautions, [facility]       Visit Diagnosis: Stiffness of left wrist, not elsewhere classified  Muscle weakness (generalized)  Pain in left wrist  Generalized edema  Other lack of coordination    Problem List Patient Active Problem List   Diagnosis Date Noted   H/O: hysterectomy 04/14/2021   Distal radius fracture, left 04/09/2021   OSA (obstructive sleep apnea) 12/11/2020   Polyphagia 10/22/2020   Secondary erythrocytosis 09/14/2020   Class 1 obesity due to excess calories with body mass index (BMI) of 30.0 to 30.9 in adult 08/22/2020   Acid reflux 08/22/2020   Dysphagia 08/22/2020   Globus sensation 05/18/2018   Chest pain with moderate risk for cardiac etiology - concern for coronary spasm 08/26/2016   Dyspareunia, female 23/34/3568   Lichen simplex chronicus 06/01/2015   Hyperlipidemia 06/01/2015   Other and unspecified hyperlipidemia 04/14/2014    Almyra Deforest, OT 08/08/2021, 5:44 PM  [facility] [address] [place], [place], [address] Phone: [phone]   Fax:  [phone]  Name: EILENE VOIGT MRN: [id number] Date of Birth: Apr 26, 1956

## 2021-08-08 NOTE — Progress Notes (Signed)
Post-Op Visit Note   Patient: Meredith Shannon           Date of Birth: 11/06/1955           MRN: [id number] Visit Date: 08/08/2021 PCP: Aletha Halim., PA-C   Assessment & Plan:  Chief Complaint:  Chief Complaint  Patient presents with   Left Wrist - Post-op Follow-up   Visit Diagnoses: No diagnosis found.  Plan: Patient is now approximately 7 weeks out from [facility] from her distal radius fracture.  She has multiple complaints in this extremity. She has occasional pain from her shoulder distal.  It is sometimes in her dorsal forearm, sometimes at base of her thumb at the [facility] joint, and sometimes at the ulnar wrist. Her ROM is much improved from when I last saw her.  She has full pronation/supination.  She has 50 deg extension and 30 deg flexion which seems symmetric to the contralateral side.  I can see her back in another month.   Follow-Up Instructions: No follow-ups on file.   Orders:  No orders of the defined types were placed in this encounter.  No orders of the defined types were placed in this encounter.   Imaging: No results found.  PMFS History: Patient Active Problem List   Diagnosis Date Noted   H/O: hysterectomy 04/14/2021   Distal radius fracture, left 04/09/2021   OSA (obstructive sleep apnea) 12/11/2020   Polyphagia 10/22/2020   Secondary erythrocytosis 09/14/2020   Class 1 obesity due to excess calories with body mass index (BMI) of 30.0 to 30.9 in adult 08/22/2020   Acid reflux 08/22/2020   Dysphagia 08/22/2020   Globus sensation 05/18/2018   Chest pain with moderate risk for cardiac etiology - concern for coronary spasm 08/26/2016   Dyspareunia, female 14/43/1540   Lichen simplex chronicus 06/01/2015   Hyperlipidemia 06/01/2015   Other and unspecified hyperlipidemia 04/14/2014   Past Medical History:  Diagnosis Date   Anxiety    Broken wrist 2008   Cancer [facility])    basal cell   Chest pain 2018   seen by Dr. Ellyn Hack.  Has mid LAD stenosis.    Chest pain    Constipation    GERD (gastroesophageal reflux disease)    occasional uses tums   High cholesterol    In total cholesterol and triglycerides.   High triglycerides    HSV-1 infection    Joint pain    Lichen simplex chronicus    Sleep apnea    Swallowing difficulty    Vitamin D deficiency     Family History  Problem Relation Age of Onset   Osteoporosis Mother    High blood pressure Mother    High Cholesterol Mother    Depression Mother    Sudden death Father    Alcohol abuse Father     Past Surgical History:  Procedure Laterality Date   BREAST BIOPSY  5/12   fibrocystic changes   CYSTOSCOPY N/A 03/09/2017   Procedure: CYSTOSCOPY;  Surgeon: Megan Salon, MD;  Location: [facility];  Service: Gynecology;  Laterality: N/A;   DILATATION & CURETTAGE/HYSTEROSCOPY WITH MYOSURE N/A 12/26/2016   Procedure: [place];  Surgeon: Megan Salon, MD;  Location: [facility];  Service: Gynecology;  Laterality: N/A;   DILATION AND CURETTAGE OF UTERUS     "years ago"   FACIAL COSMETIC SURGERY     HAND SURGERY Right 2012   fractured bone in hands   HARDWARE REMOVAL  Left 06/18/2021   Procedure: REMOVAL OF HARDWARE LEFT DISTAL RADIUS FRACTURE;  Surgeon: Sherilyn Cooter, MD;  Location: [place];  Service: Orthopedics;  Laterality: Left;   LAPAROSCOPIC HYSTERECTOMY N/A 03/09/2017   Procedure: HYSTERECTOMY TOTAL LAPAROSCOPIC;  Surgeon: Megan Salon, MD;  Location: [facility];  Service: Gynecology;  Laterality: N/A;   LAPAROSCOPIC SALPINGO OOPHERECTOMY Bilateral 03/09/2017   Procedure: LAPAROSCOPIC SALPINGO OOPHORECTOMY;  Surgeon: Megan Salon, MD;  Location: [facility];  Service: Gynecology;  Laterality: Bilateral;   OPEN REDUCTION INTERNAL FIXATION (ORIF) DISTAL RADIAL FRACTURE Left 04/12/2021   Procedure: OPEN REDUCTION INTERNAL FIXATION (ORIF) LEFT DISTAL RADIUS FRACTURE;  Surgeon:  Sherilyn Cooter, MD;  Location: [place];  Service: Orthopedics;  Laterality: Left;   TUBAL LIGATION  1983   WRIST FRACTURE SURGERY Right 2008   Social History   Occupational History   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

## 2021-08-13 DIAGNOSIS — M25532 Pain in left wrist: Secondary | ICD-10-CM | POA: Diagnosis not present

## 2021-08-14 ENCOUNTER — Ambulatory Visit: Payer: BC Managed Care – PPO | Admitting: Orthopaedic Surgery

## 2021-08-14 ENCOUNTER — Encounter: Payer: Self-pay | Admitting: Orthopaedic Surgery

## 2021-08-14 ENCOUNTER — Other Ambulatory Visit: Payer: Self-pay

## 2021-08-14 ENCOUNTER — Encounter: Payer: Self-pay | Admitting: Occupational Therapy

## 2021-08-14 ENCOUNTER — Ambulatory Visit: Payer: BC Managed Care – PPO | Admitting: Occupational Therapy

## 2021-08-14 DIAGNOSIS — G8929 Other chronic pain: Secondary | ICD-10-CM

## 2021-08-14 DIAGNOSIS — R278 Other lack of coordination: Secondary | ICD-10-CM

## 2021-08-14 DIAGNOSIS — R601 Generalized edema: Secondary | ICD-10-CM

## 2021-08-14 DIAGNOSIS — M25512 Pain in left shoulder: Secondary | ICD-10-CM | POA: Diagnosis not present

## 2021-08-14 DIAGNOSIS — M6281 Muscle weakness (generalized): Secondary | ICD-10-CM

## 2021-08-14 DIAGNOSIS — M25532 Pain in left wrist: Secondary | ICD-10-CM

## 2021-08-14 DIAGNOSIS — M25632 Stiffness of left wrist, not elsewhere classified: Secondary | ICD-10-CM

## 2021-08-14 NOTE — Addendum Note (Signed)
Addended by: Precious Bard on: 08/14/2021 09:34 AM   Modules accepted: Orders

## 2021-08-14 NOTE — Progress Notes (Signed)
Office Visit Note   Patient: Meredith Shannon           Date of Birth: August 19, 1955           MRN: [id number] Visit Date: 08/14/2021              Requested by: Aletha Halim., PA-C [address],  [place] [address] PCP: Aletha Halim., PA-C   Assessment & Plan: Visit Diagnoses:  1. Chronic left shoulder pain     Plan: Meredith Shannon returns today to discuss left shoulder MRI results.  No significant improvement in symptoms.  Continues to have lateral shoulder pain.  Left shoulder exam is unchanged.  MR arthrogram shows mild distal supraspinatus tendinopathy with interstitial tearing.  I feel that this is fairly consistent with her reported symptoms.  Based on treatment options she agreed to a course of outpatient PT and intra-articular cortisone injection.  She is to follow-up if she does not feel any improvement.  Follow-Up Instructions: No follow-ups on file.   Orders:  Orders Placed This Encounter  Procedures   Ambulatory referral to Physical Therapy   No orders of the defined types were placed in this encounter.     Procedures: No procedures performed   Clinical Data: No additional findings.   Subjective: Chief Complaint  Patient presents with   Left Shoulder - Pain, Follow-up    HPI  Review of Systems   Objective: Vital Signs: LMP 08/04/2006   Physical Exam  Ortho Exam  Specialty Comments:  No specialty comments available.  Imaging: No results found.   PMFS History: Patient Active Problem List   Diagnosis Date Noted   H/O: hysterectomy 04/14/2021   Distal radius fracture, left 04/09/2021   OSA (obstructive sleep apnea) 12/11/2020   Polyphagia 10/22/2020   Secondary erythrocytosis 09/14/2020   Class 1 obesity due to excess calories with body mass index (BMI) of 30.0 to 30.9 in adult 08/22/2020   Acid reflux 08/22/2020   Dysphagia 08/22/2020   Globus sensation 05/18/2018   Chest pain with moderate risk for cardiac etiology -  concern for coronary spasm 08/26/2016   Dyspareunia, female 38/75/6433   Lichen simplex chronicus 06/01/2015   Hyperlipidemia 06/01/2015   Other and unspecified hyperlipidemia 04/14/2014   Past Medical History:  Diagnosis Date   Anxiety    Broken wrist 2008   Cancer [facility])    basal cell   Chest pain 2018   seen by Dr. Ellyn Hack.  Has mid LAD stenosis.   Chest pain    Constipation    GERD (gastroesophageal reflux disease)    occasional uses tums   High cholesterol    In total cholesterol and triglycerides.   High triglycerides    HSV-1 infection    Joint pain    Lichen simplex chronicus    Sleep apnea    Swallowing difficulty    Vitamin D deficiency     Family History  Problem Relation Age of Onset   Osteoporosis Mother    High blood pressure Mother    High Cholesterol Mother    Depression Mother    Sudden death Father    Alcohol abuse Father     Past Surgical History:  Procedure Laterality Date   BREAST BIOPSY  5/12   fibrocystic changes   CYSTOSCOPY N/A 03/09/2017   Procedure: CYSTOSCOPY;  Surgeon: Megan Salon, MD;  Location: [facility];  Service: Gynecology;  Laterality: N/A;   DILATATION & CURETTAGE/HYSTEROSCOPY WITH MYOSURE N/A  12/26/2016   Procedure: [place];  Surgeon: Megan Salon, MD;  Location: [facility];  Service: Gynecology;  Laterality: N/A;   DILATION AND CURETTAGE OF UTERUS     "years ago"   FACIAL COSMETIC SURGERY     HAND SURGERY Right 2012   fractured bone in hands   HARDWARE REMOVAL Left 06/18/2021   Procedure: REMOVAL OF HARDWARE LEFT DISTAL RADIUS FRACTURE;  Surgeon: Sherilyn Cooter, MD;  Location: [place];  Service: Orthopedics;  Laterality: Left;   LAPAROSCOPIC HYSTERECTOMY N/A 03/09/2017   Procedure: HYSTERECTOMY TOTAL LAPAROSCOPIC;  Surgeon: Megan Salon, MD;  Location: [facility];  Service: Gynecology;  Laterality: N/A;   LAPAROSCOPIC SALPINGO  OOPHERECTOMY Bilateral 03/09/2017   Procedure: LAPAROSCOPIC SALPINGO OOPHORECTOMY;  Surgeon: Megan Salon, MD;  Location: [facility];  Service: Gynecology;  Laterality: Bilateral;   OPEN REDUCTION INTERNAL FIXATION (ORIF) DISTAL RADIAL FRACTURE Left 04/12/2021   Procedure: OPEN REDUCTION INTERNAL FIXATION (ORIF) LEFT DISTAL RADIUS FRACTURE;  Surgeon: Sherilyn Cooter, MD;  Location: [place];  Service: Orthopedics;  Laterality: Left;   TUBAL LIGATION  1983   WRIST FRACTURE SURGERY Right 2008   Social History   Occupational History   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

## 2021-08-14 NOTE — Therapy (Signed)
[facility] [address] [place], [place], [address] Phone: [phone]   Fax:  [phone]  Occupational Therapy Treatment & Discharge Summary  Patient Details  Name: Meredith Shannon MRN: [id number] Date of Birth: [birth year] Referring Provider (OT): [place]   Encounter Date: 08/14/2021   OT End of Session - 08/14/21 0806     Visit Number 8    Number of Visits 8    Date for OT Re-Evaluation 08/15/21    Authorization Type [employer] Comm PPO    Progress Note Due on Visit 10    OT Start Time 0804    OT Stop Time 0844    OT Time Calculation (min) 40 min    Activity Tolerance Patient tolerated treatment well    Behavior During Therapy [facility] for tasks assessed/performed             Past Medical History:  Diagnosis Date   Anxiety    Broken wrist 2008   Cancer [facility])    basal cell   Chest pain 2018   seen by Dr. Ellyn Hack.  Has mid LAD stenosis.   Chest pain    Constipation    GERD (gastroesophageal reflux disease)    occasional uses tums   High cholesterol    In total cholesterol and triglycerides.   High triglycerides    HSV-1 infection    Joint pain    Lichen simplex chronicus    Sleep apnea    Swallowing difficulty    Vitamin D deficiency     Past Surgical History:  Procedure Laterality Date   BREAST BIOPSY  5/12   fibrocystic changes   CYSTOSCOPY N/A 03/09/2017   Procedure: CYSTOSCOPY;  Surgeon: Megan Salon, MD;  Location: [facility];  Service: Gynecology;  Laterality: N/A;   DILATATION & CURETTAGE/HYSTEROSCOPY WITH MYOSURE N/A 12/26/2016   Procedure: [place];  Surgeon: Megan Salon, MD;  Location: [facility];  Service: Gynecology;  Laterality: N/A;   DILATION AND CURETTAGE OF UTERUS     "years ago"   FACIAL COSMETIC SURGERY     HAND SURGERY Right 2012   fractured bone in hands   HARDWARE REMOVAL Left 06/18/2021   Procedure: REMOVAL OF HARDWARE LEFT DISTAL RADIUS  FRACTURE;  Surgeon: Sherilyn Cooter, MD;  Location: [place];  Service: Orthopedics;  Laterality: Left;   LAPAROSCOPIC HYSTERECTOMY N/A 03/09/2017   Procedure: HYSTERECTOMY TOTAL LAPAROSCOPIC;  Surgeon: Megan Salon, MD;  Location: [facility];  Service: Gynecology;  Laterality: N/A;   LAPAROSCOPIC SALPINGO OOPHERECTOMY Bilateral 03/09/2017   Procedure: LAPAROSCOPIC SALPINGO OOPHORECTOMY;  Surgeon: Megan Salon, MD;  Location: [facility];  Service: Gynecology;  Laterality: Bilateral;   OPEN REDUCTION INTERNAL FIXATION (ORIF) DISTAL RADIAL FRACTURE Left 04/12/2021   Procedure: OPEN REDUCTION INTERNAL FIXATION (ORIF) LEFT DISTAL RADIUS FRACTURE;  Surgeon: Sherilyn Cooter, MD;  Location: [place];  Service: Orthopedics;  Laterality: Left;   TUBAL LIGATION  1983   WRIST FRACTURE SURGERY Right 2008    There were no vitals filed for this visit.   Subjective Assessment - 08/14/21 1225     Subjective  Pt reports that she has a f/u appointment with Dr Erlinda Hong later today re her shoulder.    Pertinent History ORIF Lt DRF ON 04/12/21; hardware removal on 06/18/21. PMH: Sleep apnea, anxiety, GERD, Cancer - basal cell, hyperlipidemia    Patient Stated Goals Get as much range of motion as possible.  Currently in Pain? Yes    Pain Score 1     Pain Location Wrist    Pain Orientation Left    Pain Descriptors / Indicators Aching    Pain Type Acute pain    Pain Onset More than a month ago    Pain Frequency Intermittent    Aggravating Factors  Certain movements    Pain Relieving Factors Rest, repositioning    Multiple Pain Sites No   Pt does have shoulder pain at times             [facility] OT Assessment - 08/14/21 0001       AROM   AROM Assessment Site Wrist;Finger   Left pronation =   WNL's supination = WNL's   Right/Left Shoulder Left    Left Wrist Extension 51 Degrees   right = 54   Left Wrist Flexion 54 Degrees   Right = 64*      Hand Function   Left Hand Grip (lbs) 32.9             OT Treatments/Exercises (OP) - 08/14/21 0001       ADLs   ADL Comments Pt reports independence for all ADL and homemaking tasks. She states that grip is improving and feeling as though L UE is still weaker in grip strength overall. She was encouraged to use bilateral hands for all functional activity when able & that this will assist with increased strength over time. She is Mod I HEP and putty use at this time but should benfeit from continued performance independently. Pt is in agreement with this. She will f/u with Dr Erlinda Hong later today for her shoulder. She may benefit from referral to OP PT for her shoulder tendonitis if he deems appropriate.      Exercises   Exercises Elbow;Wrist;Hand      Shoulder Exercises: Supine   Other Supine Exercises Verbal review of shoulder HEP in supine. Pt is Mod I with this. Pt reports that she "does not do these as often as I should". MRI results reveal tendinitis of supraspinatus tendon but pt is waiting on f/u visit with Dr Erlinda Hong. She may benefit from OP PT to address this.      Shoulder Exercises: ROM/Strengthening       Other ROM/Strengthening Exercises Verbal review. Pt is Mod I overall.      Elbow Exercises   Elbow Extension AROM;AAROM;Self ROM;Left;Seated;10 reps   L elbow A/ROM is WNL's. Pt w/ c/o "stiffness" at times.   Other elbow exercises Reviewed/performed HEP and performed active and A/AROM, self range for elbow extension pt reports stiffness at times. A/ROM is WNL's overall      Additional Elbow Exercises   UBE (Upper Arm Bike) UBE level 3 rotating forward and back for A/ROM to bilateral UE's elbow, wrist and hands. To encourage grip, wrist, elbow and shoulder ROM/functional use and strengthening x8 min w/o difficulty noted.      Wrist Exercises   Other wrist exercises Reviewed most recent HEP for wrist P/ROM and strengthening using 3# weights.    Other wrist exercises A/AROM, A/ROM and  gentle P/ROM for forearm pronation/supination left. A/ROM for forearm, wrist and digits is WFL/WNL's at this time.     Hand Exercises   Other Hand Exercises Reviewed grip with red theraputty for increased resistance. Pt will cont independently. Issued green putty. Pt will progress as able to assist with increased grip strength.    Other Hand Exercises Red and orange squeeze ball(s)  for grip strengthening left hand x15-20 reps each. Wrist exerciser/yellow for left RD/UD resisted x15-20 reps all w/o c/o pain              OT Short Term Goals - 07/16/21 1251       OT SHORT TERM GOAL #1   Title Pt will be Mod I splinting use, care and precautions L UE    Time 4    Period Weeks    Status Achieved    Target Date 07/18/21      OT SHORT TERM GOAL #2   Title Pt will be Mod I initial HEP L UE    Time 4    Period Weeks    Status Achieved    Target Date 07/18/21      OT SHORT TERM GOAL #3   Title Pt will be Mod I edema control L UE as seen by I ability to state 2-3 techniques.    Time 4    Period Weeks    Status Achieved    Target Date 07/18/21               OT Long Term Goals - 08/14/21 0832       OT LONG TERM GOAL #1   Title Pt will be Mod I updated HEP for L UE as seen by independent ability to perform in the clinc    Time 8    Period Weeks    Status Achieved    Target Date 08/15/21      OT LONG TERM GOAL #2   Title Pt will be Mod I ADL's using left UE as non-dominant UE as seen by simulated activity in the clinic    Time 8    Period Weeks    Status Achieved    Target Date 08/15/21      OT LONG TERM GOAL #3   Title Pt will demonstrate functional A/ROM L wrist as seen by goniometer assessment within 10*  to R UE    Time 8    Period Weeks    Status Achieved    Target Date 08/15/21      OT LONG TERM GOAL #4   Title Pt will demonstrate increased strength as seen by ability to grip 10# or more with L    Time 8    Period Weeks    Status Achieved    Target Date  08/15/21              Plan - 08/14/21 1012     Clinical Impression Statement Pt has met 4/4 LTG's at this time and will cont HEP independently for continued strengthening of L UE for grip. She may benefit from OP PT for left shoulder tendonitis and plans to f/u with Dr Erlinda Hong later today. Pt is in agreement with D/C to independent home program. Will sign off OT at this time.    OT Occupational Profile and History Problem Focused Assessment - Including review of records relating to presenting problem    Occupational performance deficits (Please refer to evaluation for details): ADL's    Body Structure / Function / Physical Skills ADL;Strength;Pain;Edema;UE functional use;ROM;Scar mobility;Coordination;Flexibility;Mobility;Sensation;Decreased knowledge of precautions;FMC    Rehab Potential Good    Clinical Decision Making Limited treatment options, no task modification necessary    Comorbidities Affecting Occupational Performance: May have comorbidities impacting occupational performance    Modification or Assistance to Complete Evaluation  No modification of tasks or assist necessary to complete eval  OT Frequency Other (comment)    OT Duration 6 weeks    OT Treatment/Interventions Self-care/ADL training;Fluidtherapy;Splinting;Therapeutic activities;Therapeutic exercise;Scar mobilization;Cryotherapy;Passive range of motion;Manual Therapy;Patient/family education    Plan D/C at this time to independent Home program. Sign off OT    Consulted and Agree with Plan of Care Patient             Patient will benefit from skilled therapeutic intervention in order to improve the following deficits and impairments:   Body Structure / Function / Physical Skills: ADL, Strength, Pain, Edema, UE functional use, ROM, Scar mobility, Coordination, Flexibility, Mobility, Sensation, Decreased knowledge of precautions, [place]     OCCUPATIONAL THERAPY DISCHARGE SUMMARY  Visits from Start of Care: 8  Current  functional level related to goals / functional outcomes: Pt has met 4/4 LTG's related to her left hand and wrist s/p ORIF distal radius fracture and hardware removal. She is steadily improving with grip strength in her left non-dominant hand. She does have shoulder pain and has a f/u appointment later today with Dr Erlinda Hong. She may benefit from OP PT treatment for left shoulder tendonitis.    Remaining deficits: See above.   Education / Equipment: Putty, continue HEP and use bilateral hands for ADL, homemaking and work related tasks. F/u with Dr Erlinda Hong re: possible referral for OP PT secondary to L shoulder pain/tendonitis.   Patient agrees to discharge. Patient goals were met. Patient is being discharged due to meeting the stated rehab goals..  Visit Diagnosis: Stiffness of left wrist, not elsewhere classified  Muscle weakness (generalized)  Pain in left wrist  Generalized edema  Other lack of coordination  Problem List Patient Active Problem List   Diagnosis Date Noted   H/O: hysterectomy 04/14/2021   Distal radius fracture, left 04/09/2021   OSA (obstructive sleep apnea) 12/11/2020   Polyphagia 10/22/2020   Secondary erythrocytosis 09/14/2020   Class 1 obesity due to excess calories with body mass index (BMI) of 30.0 to 30.9 in adult 08/22/2020   Acid reflux 08/22/2020   Dysphagia 08/22/2020   Globus sensation 05/18/2018   Chest pain with moderate risk for cardiac etiology - concern for coronary spasm 08/26/2016   Dyspareunia, female 03/16/8870   Lichen simplex chronicus 06/01/2015   Hyperlipidemia 06/01/2015   Other and unspecified hyperlipidemia 04/14/2014    Almyra Deforest, OT 08/14/2021, 12:35 PM  [facility] [address] [place], [place], [address] Phone: [phone]   Fax:  [phone]  Name: DANNICA BICKHAM MRN: [id number] Date of Birth: 10-08-1955

## 2021-08-20 ENCOUNTER — Other Ambulatory Visit: Payer: Self-pay

## 2021-08-20 ENCOUNTER — Encounter: Payer: Self-pay | Admitting: Physical Therapy

## 2021-08-20 ENCOUNTER — Ambulatory Visit: Payer: BC Managed Care – PPO | Admitting: Physical Therapy

## 2021-08-20 DIAGNOSIS — M6281 Muscle weakness (generalized): Secondary | ICD-10-CM

## 2021-08-20 DIAGNOSIS — M25512 Pain in left shoulder: Secondary | ICD-10-CM

## 2021-08-20 DIAGNOSIS — G8929 Other chronic pain: Secondary | ICD-10-CM | POA: Diagnosis not present

## 2021-08-20 DIAGNOSIS — M25612 Stiffness of left shoulder, not elsewhere classified: Secondary | ICD-10-CM | POA: Diagnosis not present

## 2021-08-20 NOTE — Patient Instructions (Signed)
Access Code: [email or web address] URL: [email or web address] Date: 08/20/2021 Prepared by: Elsie Ra  Exercises Standing Shoulder Posterior Capsule Stretch - 2 x daily - 3-4 x weekly - 1 sets - 5 reps - 10 hold Standing Shoulder Internal Rotation with Anchored Resistance - 2 x daily - 6 x weekly - 2-3 sets - 10 reps Shoulder External Rotation with Anchored Resistance - 2 x daily - 6 x weekly - 2-3 sets - 10 reps Standing Single Arm Shoulder Abduction with Resistance - 2 x daily - 6 x weekly - 2-3 sets - 10 reps Standing Shoulder Horizontal Abduction with Resistance - 2 x daily - 6 x weekly - 2-3 sets - 10 reps

## 2021-08-20 NOTE — Therapy (Signed)
[facility] [address] [place], [place], [address] Phone: [phone]   Fax:  [phone]  Physical Therapy Evaluation  Patient Details  Name: Meredith Shannon MRN: [id number] Date of Birth: 05/28/1956 Referring Provider (PT): Leandrew Koyanagi, MD   Encounter Date: 08/20/2021   PT End of Session - 08/20/21 1103     Visit Number 1    Number of Visits 6    Date for PT Re-Evaluation 10/01/21    PT Start Time 0803    PT Stop Time 7893    PT Time Calculation (min) 44 min    Activity Tolerance Patient tolerated treatment well    Behavior During Therapy [facility] for tasks assessed/performed             Past Medical History:  Diagnosis Date   Anxiety    Broken wrist 2008   Cancer [facility])    basal cell   Chest pain 2018   seen by Dr. Ellyn Hack.  Has mid LAD stenosis.   Chest pain    Constipation    GERD (gastroesophageal reflux disease)    occasional uses tums   High cholesterol    In total cholesterol and triglycerides.   High triglycerides    HSV-1 infection    Joint pain    Lichen simplex chronicus    Sleep apnea    Swallowing difficulty    Vitamin D deficiency     Past Surgical History:  Procedure Laterality Date   BREAST BIOPSY  5/12   fibrocystic changes   CYSTOSCOPY N/A 03/09/2017   Procedure: CYSTOSCOPY;  Surgeon: Megan Salon, MD;  Location: [facility];  Service: Gynecology;  Laterality: N/A;   DILATATION & CURETTAGE/HYSTEROSCOPY WITH MYOSURE N/A 12/26/2016   Procedure: [place];  Surgeon: Megan Salon, MD;  Location: [place] [facility];  Service: Gynecology;  Laterality: N/A;   DILATION AND CURETTAGE OF UTERUS     "years ago"   FACIAL COSMETIC SURGERY     HAND SURGERY Right 2012   fractured bone in hands   HARDWARE REMOVAL Left 06/18/2021   Procedure: REMOVAL OF HARDWARE LEFT DISTAL RADIUS FRACTURE;  Surgeon: Sherilyn Cooter, MD;  Location: [place];  Service:  Orthopedics;  Laterality: Left;   LAPAROSCOPIC HYSTERECTOMY N/A 03/09/2017   Procedure: HYSTERECTOMY TOTAL LAPAROSCOPIC;  Surgeon: Megan Salon, MD;  Location: [facility];  Service: Gynecology;  Laterality: N/A;   LAPAROSCOPIC SALPINGO OOPHERECTOMY Bilateral 03/09/2017   Procedure: LAPAROSCOPIC SALPINGO OOPHORECTOMY;  Surgeon: Megan Salon, MD;  Location: [facility];  Service: Gynecology;  Laterality: Bilateral;   OPEN REDUCTION INTERNAL FIXATION (ORIF) DISTAL RADIAL FRACTURE Left 04/12/2021   Procedure: OPEN REDUCTION INTERNAL FIXATION (ORIF) LEFT DISTAL RADIUS FRACTURE;  Surgeon: Sherilyn Cooter, MD;  Location: [place];  Service: Orthopedics;  Laterality: Left;   TUBAL LIGATION  1983   WRIST FRACTURE SURGERY Right 2008    There were no vitals filed for this visit.    Subjective Assessment - 08/20/21 1039     Subjective Pt. presents with L shoulder pain. She states that she slipped on water and fell onto her arm in August, 2022. She broke her wrist and injured her shoulder during this fall. She reports some numbness and tingling in the hand/ wrist. Currently she has a 0/10 NPS, however the pain escalates to 8-9/10 with reaching activities. She states that the pain has been getting better over the last few days.  Pertinent History Anxiety, Broken Wrist, Cancer, Chest Pain, GERD, HCL,   REMOVAL OF HARDWARE LEFT DISTAL RADIUS FRACTURE- 11/15/202    Limitations Lifting;House hold activities;Other (comment)   Reaching activities   Diagnostic tests MR arthrogram shows "mild distal supraspinatus tendinopathy with interstitial tearing"    Patient Stated Goals Decrease pain, be able to perform overhead activities    Currently in Pain? Yes    Pain Score 1     Pain Location Shoulder    Pain Orientation Left    Pain Descriptors / Indicators Aching;Tightness   Pinching   Pain Type Chronic pain    Pain Onset More than a month ago    Pain Frequency  Intermittent    Aggravating Factors  Reaching overhead, behind back    Pain Relieving Factors Resting, moving out of aggravating position    Multiple Pain Sites No    Pain Onset More than a month ago                [facility] PT Assessment - 08/20/21 0001       Assessment   Medical Diagnosis Chronic left shoulder pain (rotator cuff syndrome)    Referring Provider (PT) Leandrew Koyanagi, MD    Onset Date/Surgical Date --   pain since fall in Aug 2022   Hand Dominance Right    Next MD Visit 08/28/21    Prior Therapy had OT after wrist fx      Precautions   Precautions None      Restrictions   Weight Bearing Restrictions No      Balance Screen   Has the patient fallen in the past 6 months No    Has the patient had a decrease in activity level because of a fear of falling?  Yes   Pt. has a fear of falling and is extra cautious when navigating   Is the patient reluctant to leave their home because of a fear of falling?  No      Home Ecologist residence      Prior Function   Level of Independence Independent      Cognition   Overall Cognitive Status Within Functional Limits for tasks assessed      Observation/Other Assessments   Focus on Therapeutic Outcomes (FOTO)  55% functional score, gaol is 68%      Functional Tests   Functional tests Other   Reaching overhead with L shoulder, limited     ROM / Strength   AROM / PROM / Strength AROM;PROM;Strength      AROM   Overall AROM  Deficits    AROM Assessment Site Shoulder    Right/Left Shoulder Left   All R shoulder movements WNL   Left Shoulder Extension 75 Degrees    Left Shoulder Flexion 130 Degrees    Left Shoulder ABduction 120 Degrees    Left Shoulder Internal Rotation --   To bottom of shoulder blade   Left Shoulder External Rotation --   To spine of scapula     PROM   Overall PROM  Deficits    Overall PROM Comments --   R shoulder PROM WNL   PROM Assessment Site Shoulder     Right/Left Shoulder Left    Left Shoulder Extension --   WNL   Left Shoulder Flexion 149 Degrees    Left Shoulder ABduction 120 Degrees    Left Shoulder Internal Rotation --   WNL   Left Shoulder External Rotation 68  Degrees      Strength   Overall Strength Deficits    Overall Strength Comments --   Lt Scaption 4/5; Lt Horizontal abd 4/5   Strength Assessment Site Shoulder    Right/Left Shoulder Left   R side measurements all 5/5   Left Shoulder Flexion 5/5   Scaption 4/5   Left Shoulder Extension 5/5    Left Shoulder ABduction 4/5    Left Shoulder Internal Rotation 4/5    Left Shoulder External Rotation 5/5      Palpation   Palpation comment mild GH hyomobility on Lt compared to Rt side      Special Tests    Special Tests Rotator Cuff Impingement    Rotator Cuff Impingment tests Neer impingement test;Hawkins- Kennedy test;Painful Arc of Motion;Full Can test      Neer Impingement test    Findings Positive    Side Left      Hawkins-Kennedy test   Findings Negative    Side Left      Full Can test   Findings Negative    Side Left      Painful Arc of Motion   Findings Positive    Side Left                        Objective measurements completed on examination: See above findings.                PT Education - 08/20/21 1101     Education Details Pt. educated on subacromial impingement with shoulder model, and HEP.    Person(s) Educated Patient    Methods Explanation;Demonstration;Verbal cues;Handout    Comprehension Verbalized understanding;Need further instruction              PT Short Term Goals - 08/20/21 1119       PT SHORT TERM GOAL #1   Title Decrease pain with overhead activities 50% (4/10)    Baseline 8-9/10    Time 4    Period Weeks    Status On-going    Target Date 09/17/21      PT SHORT TERM GOAL #2   Title Increase AROM of shoulder flexion and abduction to 150 deg.    Baseline 130 deg. flex. 120 deg. abd.     Time 4    Period Weeks    Status On-going    Target Date 09/17/21               PT Long Term Goals - 08/20/21 1123       PT LONG TERM GOAL #1   Title Increase FOTO score to 68%    Baseline 55%    Time 6    Period Weeks    Status New    Target Date 10/01/21      PT LONG TERM GOAL #2   Title Independent compliance with HEP    Time 6    Period Weeks    Status New    Target Date 10/01/21      PT LONG TERM GOAL #3   Title Increase L shoulder flexion/ abd. to full functional ROM equal to Rt shoulder    Time 6    Period Weeks    Status New    Target Date 10/01/21      PT LONG TERM GOAL #4   Title Increase all L shoulder strength to 5/5    Time 6    Period Weeks    Status  New    Target Date 10/01/21                    Plan - 08/20/21 1104     Clinical Impression Statement Pt. presents with L rotator cuff tendonopathy confirmed by MRI, as a result from a fall. Pt. demonstrates overall AROM & PROM deficits. Mild hypomobility of the L shoulder joint was also noted in comparison to the R shoulder joint. Pt. demonstrated decreased muscle strength of scapular and periscapular muscles. She has signs of shoulder impingement as well. Skilled PT is needed to address functional AROM and strength deficits.    Personal Factors and Comorbidities Comorbidity 3+    Comorbidities PMH: Anxiety, Broken Wrist, Cancer, Chest Pain, GERD, HCL,   REMOVAL OF HARDWARE LEFT DISTAL RADIUS FRACTURE- 06/18/2021    Examination-Activity Limitations Dressing;Lift;Carry;Reach Overhead    Examination-Participation Restrictions Occupation;Cleaning;Community Activity;Driving   Drives for work a lot   Stability/Clinical Decision Making Stable/Uncomplicated    Clinical Decision Making Low    Rehab Potential Good    PT Frequency 1x / week    PT Duration 6 weeks    PT Treatment/Interventions ADLs/Self Care Home Management;Moist Heat;Electrical Stimulation;Cryotherapy;Ultrasound;Therapeutic  activities;Therapeutic exercise;Neuromuscular re-education;Patient/family education;Manual techniques;Dry needling;Passive range of motion;Vasopneumatic Device;Joint Manipulations;Spinal Manipulations;Iontophoresis 4mg /ml Dexamethasone    PT Next Visit Plan Reassess pt's HEP and progress in session exercises., decrease inflammation to RTC tendon as able    PT Home Exercise Plan Access Code: [email or web address]    Consulted and Agree with Plan of Care Patient             Patient will benefit from skilled therapeutic intervention in order to improve the following deficits and impairments:  Decreased mobility, Hypomobility, Decreased range of motion, Decreased activity tolerance, Decreased strength, Impaired flexibility, Impaired UE functional use, Pain  Visit Diagnosis: Chronic left shoulder pain  Muscle weakness (generalized)  Stiffness of left shoulder, not elsewhere classified     Problem List Patient Active Problem List   Diagnosis Date Noted   H/O: hysterectomy 04/14/2021   Distal radius fracture, left 04/09/2021   OSA (obstructive sleep apnea) 12/11/2020   Polyphagia 10/22/2020   Secondary erythrocytosis 09/14/2020   Class 1 obesity due to excess calories with body mass index (BMI) of 30.0 to 30.9 in adult 08/22/2020   Acid reflux 08/22/2020   Dysphagia 08/22/2020   Globus sensation 05/18/2018   Chest pain with moderate risk for cardiac etiology - concern for coronary spasm 08/26/2016   Dyspareunia, female 74/25/9563   Lichen simplex chronicus 06/01/2015   Hyperlipidemia 06/01/2015   Other and unspecified hyperlipidemia 87/56/4332    Temika Sutphin Singer, Student-PT 08/20/2021, 12:09 PM  [facility] [address] [place], [place], [address] Phone: [phone]   Fax:  [phone]  Name: Meredith Shannon MRN: [id number] Date of Birth: 1955-10-28

## 2021-08-21 DIAGNOSIS — G4733 Obstructive sleep apnea (adult) (pediatric): Secondary | ICD-10-CM | POA: Diagnosis not present

## 2021-08-28 ENCOUNTER — Ambulatory Visit: Payer: BC Managed Care – PPO | Admitting: Physical Medicine and Rehabilitation

## 2021-08-28 ENCOUNTER — Telehealth: Payer: Self-pay | Admitting: Physical Medicine and Rehabilitation

## 2021-08-28 NOTE — Telephone Encounter (Signed)
Patient called needing to reschedule her appointment due to not feeling well.  The number to contact patient is [phone]

## 2021-08-30 ENCOUNTER — Encounter: Payer: BC Managed Care – PPO | Admitting: Physical Therapy

## 2021-09-03 ENCOUNTER — Encounter: Payer: BC Managed Care – PPO | Admitting: Physical Therapy

## 2021-09-05 ENCOUNTER — Ambulatory Visit: Payer: BC Managed Care – PPO | Admitting: Orthopedic Surgery

## 2021-09-10 ENCOUNTER — Encounter: Payer: BC Managed Care – PPO | Admitting: Physical Therapy

## 2021-09-16 ENCOUNTER — Ambulatory Visit: Payer: Self-pay

## 2021-09-16 ENCOUNTER — Other Ambulatory Visit: Payer: Self-pay

## 2021-09-16 ENCOUNTER — Encounter: Payer: Self-pay | Admitting: Physical Medicine and Rehabilitation

## 2021-09-16 ENCOUNTER — Ambulatory Visit: Payer: BC Managed Care – PPO | Admitting: Physical Medicine and Rehabilitation

## 2021-09-16 DIAGNOSIS — M25512 Pain in left shoulder: Secondary | ICD-10-CM | POA: Diagnosis not present

## 2021-09-16 DIAGNOSIS — G8929 Other chronic pain: Secondary | ICD-10-CM | POA: Diagnosis not present

## 2021-09-16 NOTE — Progress Notes (Signed)
Pt state left shoulder pain. Pt state any movement with her left arm cause her pain. Pt state she doesn't takes anything for the pain.   Numeric Pain Rating Scale and Functional Assessment Average Pain 2   In the last MONTH (on 0-10 scale) has pain interfered with the following?  1. General activity like being  able to carry out your everyday physical activities such as walking, climbing stairs, carrying groceries, or moving a chair?  Rating(8)   -BT, -Dye Allergies.

## 2021-09-17 ENCOUNTER — Encounter: Payer: Self-pay | Admitting: Physical Therapy

## 2021-09-17 ENCOUNTER — Ambulatory Visit: Payer: BC Managed Care – PPO | Admitting: Physical Therapy

## 2021-09-17 DIAGNOSIS — M25612 Stiffness of left shoulder, not elsewhere classified: Secondary | ICD-10-CM

## 2021-09-17 DIAGNOSIS — M6281 Muscle weakness (generalized): Secondary | ICD-10-CM

## 2021-09-17 DIAGNOSIS — M25512 Pain in left shoulder: Secondary | ICD-10-CM

## 2021-09-17 DIAGNOSIS — G8929 Other chronic pain: Secondary | ICD-10-CM | POA: Diagnosis not present

## 2021-09-17 NOTE — Therapy (Signed)
[facility] [address] [place], [place], [address] Phone: [phone]   Fax:  [phone]  Physical Therapy Treatment  Patient Details  Name: Meredith Shannon MRN: [id number] Date of Birth: 1955-11-11 Referring Provider (PT): Leandrew Koyanagi, MD   Encounter Date: 09/17/2021   PT End of Session - 09/17/21 1031     Visit Number 2    Number of Visits 6    Date for PT Re-Evaluation 10/01/21    PT Start Time 0845    PT Stop Time 0930    PT Time Calculation (min) 45 min    Activity Tolerance Patient tolerated treatment well    Behavior During Therapy [facility] for tasks assessed/performed             Past Medical History:  Diagnosis Date   Anxiety    Broken wrist 2008   Cancer [facility])    basal cell   Chest pain 2018   seen by Dr. Ellyn Hack.  Has mid LAD stenosis.   Chest pain    Constipation    GERD (gastroesophageal reflux disease)    occasional uses tums   High cholesterol    In total cholesterol and triglycerides.   High triglycerides    HSV-1 infection    Joint pain    Lichen simplex chronicus    Sleep apnea    Swallowing difficulty    Vitamin D deficiency     Past Surgical History:  Procedure Laterality Date   BREAST BIOPSY  5/12   fibrocystic changes   CYSTOSCOPY N/A 03/09/2017   Procedure: CYSTOSCOPY;  Surgeon: Megan Salon, MD;  Location: [facility];  Service: Gynecology;  Laterality: N/A;   DILATATION & CURETTAGE/HYSTEROSCOPY WITH MYOSURE N/A 12/26/2016   Procedure: [place];  Surgeon: Megan Salon, MD;  Location: [place] [facility];  Service: Gynecology;  Laterality: N/A;   DILATION AND CURETTAGE OF UTERUS     "years ago"   FACIAL COSMETIC SURGERY     HAND SURGERY Right 2012   fractured bone in hands   HARDWARE REMOVAL Left 06/18/2021   Procedure: REMOVAL OF HARDWARE LEFT DISTAL RADIUS FRACTURE;  Surgeon: Sherilyn Cooter, MD;  Location: [place];  Service:  Orthopedics;  Laterality: Left;   LAPAROSCOPIC HYSTERECTOMY N/A 03/09/2017   Procedure: HYSTERECTOMY TOTAL LAPAROSCOPIC;  Surgeon: Megan Salon, MD;  Location: [facility];  Service: Gynecology;  Laterality: N/A;   LAPAROSCOPIC SALPINGO OOPHERECTOMY Bilateral 03/09/2017   Procedure: LAPAROSCOPIC SALPINGO OOPHORECTOMY;  Surgeon: Megan Salon, MD;  Location: [facility];  Service: Gynecology;  Laterality: Bilateral;   OPEN REDUCTION INTERNAL FIXATION (ORIF) DISTAL RADIAL FRACTURE Left 04/12/2021   Procedure: OPEN REDUCTION INTERNAL FIXATION (ORIF) LEFT DISTAL RADIUS FRACTURE;  Surgeon: Sherilyn Cooter, MD;  Location: [place];  Service: Orthopedics;  Laterality: Left;   TUBAL LIGATION  1983   WRIST FRACTURE SURGERY Right 2008    There were no vitals filed for this visit.   Subjective Assessment - 09/17/21 0842     Subjective Pt. reports the shoulder pain and ROM has gotten worse since the last time she came in. She reports not being able to keep up with HEP because of an unexpected death in the family. She received a cortisone shot in her L shoulder yesterday, and states this has made the shoulder feel a little better.    Pertinent History Anxiety, Broken Wrist, Cancer, Chest Pain, GERD, HCL, Removal of hardware left  distal radius fracture 06/18/2021    Diagnostic tests MR arthrogram shows "mild distal supraspinatus tendinopathy with interstitial tearing"    Patient Stated Goals Decrease pain be able to perform overhead activities    Aggravating Factors  Reacing overhead, behind back    Pain Relieving Factors Resting, moving out of aggravating position                [facility] PT Assessment - 09/17/21 0001       Assessment   Medical Diagnosis Chronic left shoulder pain (rotator cuff syndrome)    Referring Provider (PT) Leandrew Koyanagi, MD    Onset Date/Surgical Date --   August, 2022   Hand Dominance Right    Prior Therapy had OT after wrist  fx      AROM   Left Shoulder Extension --   WNL   Left Shoulder Flexion 140 Degrees    Left Shoulder ABduction 100 Degrees    Left Shoulder Internal Rotation --   WNL   Left Shoulder External Rotation 60 Degrees   pain     PROM   Left Shoulder ABduction 140 Degrees                           [facility] Adult PT Treatment/Exercise - 09/17/21 0001       Shoulder Exercises: Standing   External Rotation Strengthening;20 reps;Left    Theraband Level (Shoulder External Rotation) Level 3 (Green)    Flexion Strengthening;Both;20 reps    Theraband Level (Shoulder Flexion) Level 2 (Red)    Flexion Limitations Red band tied around wrist moving into shoulder flexion    ABduction Strengthening;Left;20 reps    Theraband Level (Shoulder ABduction) Level 2 (Red)    ABduction Limitations standing on end of theraband, holding other end going into shoulder abduction    Row Strengthening;Both;20 reps    Theraband Level (Shoulder Row) Level 4 (Blue)    Other Standing Exercises Red ball rolling up wall into shoulder flexion x 15; Posterior capsule stretching with L shoulder 5 sec. hold x 8; ER doorway stretch L side only 5 sec. hold x 10    Other Standing Exercises Standing at table rolling red ball into abduction 10 sec. hold x 10 reps      Manual Therapy   Manual Therapy Joint mobilization;Passive ROM    Joint Mobilization Grade 3- Inferior, Posterior and Anterior mobs to L shoulder    Passive ROM Passive Abduction and Flexion ROM to L shoulder                       PT Short Term Goals - 09/17/21 0858       PT SHORT TERM GOAL #1   Title Decrease pain with overhead activities 50% (4/10)    Baseline Still a 7-8/10 with putting on a jacket etc.    Time 4    Period Weeks    Status On-going    Target Date 09/17/21      PT SHORT TERM GOAL #2   Title Increase AROM of shoulder flexion and abduction to 150 deg.    Baseline 130 deg. flex. 120 deg. abd.    Time 4     Period Weeks    Status On-going    Target Date 09/17/21               PT Long Term Goals - 08/20/21 1123       PT  LONG TERM GOAL #1   Title Increase FOTO score to 68%    Baseline 55%    Time 6    Period Weeks    Status New    Target Date 10/01/21      PT LONG TERM GOAL #2   Title Independent compliance with HEP    Time 6    Period Weeks    Status New    Target Date 10/01/21      PT LONG TERM GOAL #3   Title Increase L shoulder flexion/ abd. to full functional ROM equal to Rt shoulder    Time 6    Period Weeks    Status New    Target Date 10/01/21      PT LONG TERM GOAL #4   Title Increase all L shoulder strength to 5/5    Time 6    Period Weeks    Status New    Target Date 10/01/21                   Plan - 09/17/21 1032     Clinical Impression Statement Session focused on reassessing pt's L shoulder ROM because it has been 4 weeks since the pt's last appt. Pt. is still showing ROM limitations with L shoulder flexion, abduction & ER. Shoulder strengthening and ROM exercises were introduced today, and will continue to be progressed over the course of pt's PT sessions. Mild hypomobility was noted of the L shoulder during joint mobs.    Personal Factors and Comorbidities Comorbidity 3+    Comorbidities PMH: Anxiety, Broken Wrist, Cancer, Chest Pain, GERD, HCL,   REMOVAL OF HARDWARE LEFT DISTAL RADIUS FRACTURE- 06/18/2021    Examination-Activity Limitations Dressing;Lift;Carry;Reach Overhead    Examination-Participation Restrictions Occupation;Cleaning;Community Activity;Driving   Drives for work a lot   Stability/Clinical Decision Making Stable/Uncomplicated    Rehab Potential Good    PT Frequency 1x / week    PT Duration 6 weeks    PT Treatment/Interventions ADLs/Self Care Home Management;Moist Heat;Electrical Stimulation;Cryotherapy;Ultrasound;Therapeutic activities;Therapeutic exercise;Neuromuscular re-education;Patient/family education;Manual  techniques;Dry needling;Passive range of motion;Vasopneumatic Device;Joint Manipulations;Spinal Manipulations;Iontophoresis 4mg /ml Dexamethasone    PT Next Visit Plan continue to progress L shoulder ROM and strength, progress to overhead exercises    PT Home Exercise Plan Access Code: [email or web address]    Consulted and Agree with Plan of Care Patient             Patient will benefit from skilled therapeutic intervention in order to improve the following deficits and impairments:  Decreased mobility, Hypomobility, Decreased range of motion, Decreased activity tolerance, Decreased strength, Impaired flexibility, Impaired UE functional use, Pain  Visit Diagnosis: Chronic left shoulder pain  Muscle weakness (generalized)  Stiffness of left shoulder, not elsewhere classified     Problem List Patient Active Problem List   Diagnosis Date Noted   H/O: hysterectomy 04/14/2021   Distal radius fracture, left 04/09/2021   OSA (obstructive sleep apnea) 12/11/2020   Polyphagia 10/22/2020   Secondary erythrocytosis 09/14/2020   Class 1 obesity due to excess calories with body mass index (BMI) of 30.0 to 30.9 in adult 08/22/2020   Acid reflux 08/22/2020   Dysphagia 08/22/2020   Globus sensation 05/18/2018   Chest pain with moderate risk for cardiac etiology - concern for coronary spasm 08/26/2016   Dyspareunia, female 93/23/5573   Lichen simplex chronicus 06/01/2015   Hyperlipidemia 06/01/2015   Other and unspecified hyperlipidemia 22/09/5425    Meredith Shannon, Student-PT 09/17/2021, 10:53 AM  [facility] Physical  Therapy [address] [place], [place], [address] Phone: [phone]   Fax:  [phone]  Name: Meredith Shannon MRN: [id number] Date of Birth: 1956-01-02

## 2021-09-18 MED ORDER — TRIAMCINOLONE ACETONIDE 40 MG/ML IJ SUSP
40.0000 mg | INTRAMUSCULAR | Status: AC | PRN
  Administered 2021-09-16: 40 mg via INTRA_ARTICULAR

## 2021-09-18 MED ORDER — BUPIVACAINE HCL 0.25 % IJ SOLN
5.0000 mL | INTRAMUSCULAR | Status: AC | PRN
  Administered 2021-09-16: 5 mL via INTRA_ARTICULAR

## 2021-09-18 NOTE — Progress Notes (Signed)
° °  Meredith Shannon - 66 y.o. female MRN [id number]  Date of birth: [birth year]  Office Visit Note: Visit Date: 09/16/2021 PCP: Aletha Halim., PA-C Referred by: Aletha Halim., PA-C  Subjective: Chief Complaint  Patient presents with   Left Shoulder - Pain   HPI:  Meredith Shannon is a 66 y.o. female who comes in today at the request of Dr. Eduard Roux for planned Left anesthetic glenohumeral arthrogram with fluoroscopic guidance.  The patient has failed conservative care including home exercise, medications, time and activity modification.  This injection will be diagnostic and hopefully therapeutic.  Please see requesting physician notes for further details and justification.   ROS Otherwise per HPI.  Assessment & Plan: Visit Diagnoses:    ICD-10-CM   1. Chronic left shoulder pain  M25.512 XR C-ARM NO REPORT   G89.29       Plan: No additional findings.   Meds & Orders: No orders of the defined types were placed in this encounter.   Orders Placed This Encounter  Procedures   Large Joint Inj   XR C-ARM NO REPORT    Follow-up: Return for visit to requesting provider as needed.   Procedures: Large Joint Inj: L glenohumeral on 09/16/2021 10:30 AM Indications: pain and diagnostic evaluation Details: 22 G 3.5 in needle, fluoroscopy-guided anteromedial approach  Arthrogram: No  Medications: 40 mg triamcinolone acetonide 40 MG/ML; 5 mL bupivacaine 0.25 % Outcome: tolerated well, no immediate complications  There was excellent flow of contrast producing a partial arthrogram of the glenohumeral joint. The patient did have relief of symptoms during the anesthetic phase of the injection. Procedure, treatment alternatives, risks and benefits explained, specific risks discussed. Consent was given by the patient. Immediately prior to procedure a time out was called to verify the correct patient, procedure, equipment, support staff and site/side marked as required. Patient was prepped  and draped in the usual sterile fashion.         Clinical History: No specialty comments available.     Objective:  VS:  HT:     WT:    BMI:      BP:    HR: bpm   TEMP: ( )   RESP:  Physical Exam   Imaging: No results found.

## 2021-09-25 ENCOUNTER — Other Ambulatory Visit: Payer: Self-pay

## 2021-09-25 ENCOUNTER — Ambulatory Visit: Payer: BC Managed Care – PPO | Admitting: Physical Therapy

## 2021-09-25 ENCOUNTER — Encounter: Payer: Self-pay | Admitting: Physical Therapy

## 2021-09-25 DIAGNOSIS — M25612 Stiffness of left shoulder, not elsewhere classified: Secondary | ICD-10-CM | POA: Diagnosis not present

## 2021-09-25 DIAGNOSIS — G8929 Other chronic pain: Secondary | ICD-10-CM

## 2021-09-25 DIAGNOSIS — M6281 Muscle weakness (generalized): Secondary | ICD-10-CM | POA: Diagnosis not present

## 2021-09-25 DIAGNOSIS — M25512 Pain in left shoulder: Secondary | ICD-10-CM

## 2021-09-25 NOTE — Therapy (Signed)
[facility] Physical Therapy [address] [place], [place], [address] Phone: [phone]   Fax:  [phone]  Physical Therapy Treatment/Discharge PHYSICAL THERAPY DISCHARGE SUMMARY  Visits from Start of Care: 3  Current functional level related to goals / functional outcomes: See below   Remaining deficits: See below   Education / Equipment: HEP  Plan: Patient agrees to discharge.  Patient goals were met. Patient is being discharged due to meeting the stated rehab goals and being pleased with her functional level.      Patient Details  Name: Meredith Shannon MRN: [id number] Date of Birth: November 21, 1955 Referring Provider (PT): Leandrew Koyanagi, MD   Encounter Date: 09/25/2021   PT End of Session - 09/25/21 0950     Visit Number 3    Number of Visits 6    Date for PT Re-Evaluation 10/01/21    PT Start Time 0930    PT Stop Time 1008    PT Time Calculation (min) 38 min    Activity Tolerance Patient tolerated treatment well    Behavior During Therapy [facility] for tasks assessed/performed             Past Medical History:  Diagnosis Date   Anxiety    Broken wrist 2008   Cancer [facility])    basal cell   Chest pain 2018   seen by Dr. Ellyn Hack.  Has mid LAD stenosis.   Chest pain    Constipation    GERD (gastroesophageal reflux disease)    occasional uses tums   High cholesterol    In total cholesterol and triglycerides.   High triglycerides    HSV-1 infection    Joint pain    Lichen simplex chronicus    Sleep apnea    Swallowing difficulty    Vitamin D deficiency     Past Surgical History:  Procedure Laterality Date   BREAST BIOPSY  5/12   fibrocystic changes   CYSTOSCOPY N/A 03/09/2017   Procedure: CYSTOSCOPY;  Surgeon: Megan Salon, MD;  [facility];  Service: Gynecology;  Laterality: N/A;   DILATATION & CURETTAGE/HYSTEROSCOPY WITH MYOSURE N/A 12/26/2016   Procedure: [place];   Surgeon: Megan Salon, MD;  Location: [facility];  Service: Gynecology;  Laterality: N/A;   DILATION AND CURETTAGE OF UTERUS     "years ago"   FACIAL COSMETIC SURGERY     HAND SURGERY Right 2012   fractured bone in hands   HARDWARE REMOVAL Left 06/18/2021   Procedure: REMOVAL OF HARDWARE LEFT DISTAL RADIUS FRACTURE;  Surgeon: Sherilyn Cooter, MD;  Location: [place];  Service: Orthopedics;  Laterality: Left;   LAPAROSCOPIC HYSTERECTOMY N/A 03/09/2017   Procedure: HYSTERECTOMY TOTAL LAPAROSCOPIC;  Surgeon: Megan Salon, MD;  Location[facility];  Service: Gynecology;  Laterality: N/A;   LAPAROSCOPIC SALPINGO OOPHERECTOMY Bilateral 03/09/2017   Procedure: LAPAROSCOPIC SALPINGO OOPHORECTOMY;  Surgeon: Megan Salon, MD;  Location: [facility];  Service: Gynecology;  Laterality: Bilateral;   OPEN REDUCTION INTERNAL FIXATION (ORIF) DISTAL RADIAL FRACTURE Left 04/12/2021   Procedure: OPEN REDUCTION INTERNAL FIXATION (ORIF) LEFT DISTAL RADIUS FRACTURE;  Surgeon: Sherilyn Cooter, MD;  Location: [place];  Service: Orthopedics;  Laterality: Left;   TUBAL LIGATION  1983   WRIST FRACTURE SURGERY Right 2008    There were no vitals filed for this visit.   Subjective Assessment - 09/25/21 0937     Subjective Pt. reports her shoulder is  doing better, not having pain today, only thing that bothers her is the exercise at home pushing into the door. We took that out and updated her final HEP as she relays she feels ready to discharge today.    Pertinent History Anxiety, Broken Wrist, Cancer, Chest Pain, GERD, HCL, Removal of hardware left distal radius fracture 06/18/2021    Limitations Lifting;House hold activities;Other (comment)   Reaching activities   Diagnostic tests MR arthrogram shows "mild distal supraspinatus tendinopathy with interstitial tearing"    Patient Stated Goals Decrease pain be able to perform overhead activities     Currently in Pain? No/denies    Pain Onset More than a month ago    Pain Onset More than a month ago                [facility] PT Assessment - 09/25/21 0001       Assessment   Medical Diagnosis Chronic left shoulder pain (rotator cuff syndrome)    Referring Provider (PT) Leandrew Koyanagi, MD      AROM   Overall AROM Comments Lt shoulder AROM WNL except abduction mildly limited to 150 deg      Strength   Left Shoulder Flexion 5/5    Left Shoulder Extension 5/5    Left Shoulder ABduction 5/5    Left Shoulder Internal Rotation 5/5                           [facility] Adult PT Treatment/Exercise - 09/25/21 0001       Shoulder Exercises: Standing   External Rotation Strengthening;20 reps;Left    Theraband Level (Shoulder External Rotation) Level 3 (Green)    ABduction Strengthening;Left;20 reps    ABduction Limitations 2# therabar using Lt for strengthening then using AAROM at last bit of end range for stretch    Extension Both;20 reps    Theraband Level (Shoulder Extension) Level 4 (Blue)    Row Strengthening;Both;20 reps    Theraband Level (Shoulder Row) Level 4 (Blue)      Shoulder Exercises: Pulleys   Flexion 3 minutes    ABduction 3 minutes                       PT Short Term Goals - 09/25/21 0954       PT SHORT TERM GOAL #1   Title Decrease pain with overhead activities 50% (4/10)    Baseline now met    Time 4    Period Weeks    Status Achieved    Target Date 09/17/21      PT SHORT TERM GOAL #2   Title Increase AROM of shoulder flexion and abduction to 150 deg.    Baseline now met    Time 4    Period Weeks    Status Achieved    Target Date 09/17/21               PT Long Term Goals - 09/25/21 0955       PT LONG TERM GOAL #1   Title Increase FOTO score to 68%    Baseline now 80%    Time 6    Period Weeks    Status Achieved    Target Date 10/01/21      PT LONG TERM GOAL #2   Title Independent compliance with HEP     Time 6    Period Weeks    Status Achieved  Target Date 10/01/21      PT LONG TERM GOAL #3   Title Increase L shoulder flexion/ abd. to full functional ROM equal to Rt shoulder    Baseline mild limitation of 150 deg compared to 160 on other side    Time 6    Period Weeks    Status Partially Met    Target Date 10/01/21      PT LONG TERM GOAL #4   Title Increase all L shoulder strength to 5/5    Time 6    Period Weeks    Status Achieved    Target Date 10/01/21                   Plan - 09/25/21 0948     Clinical Impression Statement She has made excellent progress and feels ready to discharge today. She has met her PT goals. I progressed her HEP program to transition her to independent program and provided her with stronger resitance band for home. She showed good understanding and had no further questions or concerns about discharge.    Personal Factors and Comorbidities Comorbidity 3+    Comorbidities PMH: Anxiety, Broken Wrist, Cancer, Chest Pain, GERD, HCL,   REMOVAL OF HARDWARE LEFT DISTAL RADIUS FRACTURE- 06/18/2021    Examination-Activity Limitations Dressing;Lift;Carry;Reach Overhead    Examination-Participation Restrictions Occupation;Cleaning;Community Activity;Driving   Drives for work a lot   Stability/Clinical Decision Making Stable/Uncomplicated    Rehab Potential Good    PT Frequency 1x / week    PT Duration 6 weeks    PT Treatment/Interventions ADLs/Self Care Home Management;Moist Heat;Electrical Stimulation;Cryotherapy;Ultrasound;Therapeutic activities;Therapeutic exercise;Neuromuscular re-education;Patient/family education;Manual techniques;Dry needling;Passive range of motion;Vasopneumatic Device;Joint Manipulations;Spinal Manipulations;Iontophoresis 8m/ml Dexamethasone    PT Next Visit Plan DC today    PT Home Exercise Plan Access Code: [email or web address]   Consulted and Agree with Plan of Care Patient             Patient will benefit from skilled  therapeutic intervention in order to improve the following deficits and impairments:  Decreased mobility, Hypomobility, Decreased range of motion, Decreased activity tolerance, Decreased strength, Impaired flexibility, Impaired UE functional use, Pain  Visit Diagnosis: Chronic left shoulder pain  Muscle weakness (generalized)  Stiffness of left shoulder, not elsewhere classified     Problem List Patient Active Problem List   Diagnosis Date Noted   H/O: hysterectomy 04/14/2021   Distal radius fracture, left 04/09/2021   OSA (obstructive sleep apnea) 12/11/2020   Polyphagia 10/22/2020   Secondary erythrocytosis 09/14/2020   Class 1 obesity due to excess calories with body mass index (BMI) of 30.0 to 30.9 in adult 08/22/2020   Acid reflux 08/22/2020   Dysphagia 08/22/2020   Globus sensation 05/18/2018   Chest pain with moderate risk for cardiac etiology - concern for coronary spasm 08/26/2016   Dyspareunia, female 151/70/0174  Lichen simplex chronicus 06/01/2015   Hyperlipidemia 06/01/2015   Other and unspecified hyperlipidemia 04/14/2014    BDebbe Odea PT,DPT 09/25/2021, 10:07 AM  [facility] [address] [address]Phone: [phone]  Fax:  [phone] Name: Meredith GETHERSMRN: [id number] of Birth: 803/13/1957

## 2021-09-25 NOTE — Patient Instructions (Signed)
Access Code: [email or web address] URL: [email or web address] Date: 09/25/2021 Prepared by: Elsie Ra  Exercises Standing Shoulder Posterior Capsule Stretch - 2 x daily - 3-4 x weekly - 1 sets - 5 reps - 10 hold Standing Shoulder Abduction AAROM with Dowel - 2 x daily - 6 x weekly - 3 sets - 10 reps Standing Shoulder Internal Rotation with Anchored Resistance - 2 x daily - 6 x weekly - 2-3 sets - 10 reps Shoulder External Rotation with Anchored Resistance - 2 x daily - 6 x weekly - 2-3 sets - 10 reps Standing Single Arm Shoulder Abduction with Resistance - 2 x daily - 6 x weekly - 2-3 sets - 10 reps Standing Shoulder Horizontal Abduction with Resistance - 2 x daily - 6 x weekly - 2-3 sets - 10 reps Standing Row with Anchored Resistance - 2 x daily - 6 x weekly - 2-3 sets - 15 reps Standing Shoulder Extension with Resistance - 2 x daily - 6 x weekly - 2-3 sets - 15 reps

## 2021-12-03 ENCOUNTER — Ambulatory Visit: Payer: BC Managed Care – PPO | Admitting: Adult Health

## 2021-12-09 ENCOUNTER — Telehealth: Payer: Self-pay | Admitting: *Deleted

## 2021-12-09 NOTE — Telephone Encounter (Signed)
Denyse Amass, RN ?Myriam Jacobson is Tagged in [place]. But this patient has returned her machine.   ? ?  ?Previous Messages ?  ?----- Message -----  ?From: Brandon Melnick, RN  ?Sent: 12/09/2021   8:05 AM EDT  ?To: Ocie Bob  ? ?Could you tag this pt to icode connect (I see that she has ibreeze set up 03-18-2021 but don't see her in there.)  ? ? ?Arnetha Courser  ?Female, 66 y.o., 1956/01/04  ?Pronouns:  ?she/her/hers  ?MRN:  ?[id number]  ? ?Thank you  ? ?

## 2021-12-10 ENCOUNTER — Ambulatory Visit: Payer: BC Managed Care – PPO | Admitting: Neurology

## 2021-12-10 ENCOUNTER — Encounter: Payer: Self-pay | Admitting: Neurology

## 2021-12-10 VITALS — BP 147/93 | HR 96 | Ht 70.0 in | Wt 221.0 lb

## 2021-12-10 DIAGNOSIS — E669 Obesity, unspecified: Secondary | ICD-10-CM | POA: Diagnosis not present

## 2021-12-10 DIAGNOSIS — G4733 Obstructive sleep apnea (adult) (pediatric): Secondary | ICD-10-CM

## 2021-12-10 DIAGNOSIS — Z789 Other specified health status: Secondary | ICD-10-CM

## 2021-12-10 NOTE — Patient Instructions (Signed)
It was nice to see you again today. I appreciate you trying AutoPap therapy, you were unfortunately not able to tolerate treatment.  ?Please consider going back to medical weight management for weight loss as I do believe that with weight loss her sleep apnea will improve.  We will consider a referral to dentistry.  Please talk to your dentist office about management of sleep apnea with an oral appliance/dental device.  If they do not provide treatment for sleep apnea, we can certainly facilitate with a referral to a dentist, treatment for sleep apnea with a custom made oral appliance.  Please let us know how we can help, call us or email [place] through [place].  ?

## 2021-12-10 NOTE — Progress Notes (Signed)
Subjective:  ?  ?Patient ID: Meredith Shannon is a 66 y.o. female. ? ?HPI ? ? ? ?Interim history:  ?Meredith Shannon is a 66 year old right-handed woman with an underlying medical history of reflux disease, hyperlipidemia, vitamin D deficiency, anxiety, joint pain, and mild obesity, who presents for follow-up consultation of her obstructive sleep apnea.  The patient is unaccompanied today.  I first met her at the request of her weight management physician on 10/02/2020, at which time she reported snoring and excessive daytime somnolence.  She was advised to proceed with a sleep study.  She had a home sleep test on 12/05/2020 which indicated moderate obstructive sleep apnea with an AHI of 15.6/h, O2 nadir 86%.  She was advised to start AutoPap therapy. Her set up date was 03/18/21, with an [employer] ? ?She had an interim follow-up appointment with Frann Rider, NP on 05/29/2021, at which time she was having trouble tolerating AutoPap therapy including difficulty finding a mask that would fit properly and also having difficulty tolerating the pressure and complains of nasal dryness.  She was not compliant with her AutoPap at the time.  ? ?Today, 12/10/2021: She reports that her husband is still bothered by her snoring.  Her weight has plateaued, she stopped going to the weight management clinic because she could not keep up with the appointments and also take care of her ailing mom.  She was working also full-time at the time.  Mom passed away in Aug 28, 2021 and patient has since then retired.  She is willing to explore a dental device and also go back to weight management potentially.  She had an elevated blood pressure earlier during the appointment and it was improved upon recheck.  She denies any symptoms such as headaches, blurry vision, chest tightness.  She does have quite significant allergy symptoms currently.  She took over-the-counter medication over the weekend but tries not to take medications on a regular  basis.  She has not taken a decongestant and has not had any caffeine this morning. ? ?The patient's allergies, current medications, family history, past medical history, past social history, past surgical history and problem list were reviewed and updated as appropriate.  ? ?Previously:  ? ?10/02/21: (She) reports snoring and excessive daytime somnolence. I reviewed your office note from 08/29/2020.  Her Epworth sleepiness score is 13/24, fatigue severity is 35 out of 63. She is a restless sleeper and has not woken up rested in years. She has had witnessed apneas per husband's report who has been worried about her having sleep apnea. Her husband has sleep apnea and uses a CPAP machine and has been on it successfully for years. She goes to bed between 9 and 930. She works for [employer] in [employer]. She has to go into the office for work and some travel is involved. She lives with her husband. She has a grown son. She quit smoking in 1986 and drinks alcohol 2 or 3 times a week, caffeine in the form of soda and coffee, about 2 servings altogether per day, typically none after 2 PM. She is not aware of any family history of sleep apnea. Her sleep is interrupted by having to go to the bathroom, generally she goes once a night, she denies any recurrent morning headaches. She had a tonsillectomy at age 64. She does have a TV in the bedroom and tends to fall asleep with the TV on, it is not a sleep timer. She denies any telltale symptoms  of restless leg syndrome but is a restless sleeper. She tosses and turns a lot. She had recent foot surgery for bunion repair, she is currently in a boot with the left foot, she has a follow-up coming up in about 2 weeks. They have no pets in the house.  ? ? ?Her Past Medical History Is Significant For: ?Past Medical History:  ?Diagnosis Date  ? Anxiety   ? Broken wrist 2008  ? Cancer [facility])   ? basal cell  ? Chest pain 2018  ? seen by Dr. Ellyn Hack.  Has mid LAD stenosis.  ? Chest  pain   ? Constipation   ? GERD (gastroesophageal reflux disease)   ? occasional uses tums  ? High cholesterol   ? In total cholesterol and triglycerides.  ? High triglycerides   ? HSV-1 infection   ? Joint pain   ? Lichen simplex chronicus   ? Sleep apnea   ? Swallowing difficulty   ? Vitamin D deficiency   ? ? ?Her Past Surgical History Is Significant For: ?Past Surgical History:  ?Procedure Laterality Date  ? BREAST BIOPSY  5/12  ? fibrocystic changes  ? CYSTOSCOPY N/A 03/09/2017  ? Procedure: CYSTOSCOPY;  Surgeon: Megan Salon, MD;  Location: [facility];  Service: Gynecology;  Laterality: N/A;  ? DILATATION & CURETTAGE/HYSTEROSCOPY WITH MYOSURE N/A 12/26/2016  ? Procedure: [place];  Surgeon: Megan Salon, MD;  Location: [facility];  Service: Gynecology;  Laterality: N/A;  ? DILATION AND CURETTAGE OF UTERUS    ? "years ago"  ? FACIAL COSMETIC SURGERY    ? HAND SURGERY Right 2012  ? fractured bone in hands  ? HARDWARE REMOVAL Left 06/18/2021  ? Procedure: REMOVAL OF HARDWARE LEFT DISTAL RADIUS FRACTURE;  Surgeon: Sherilyn Cooter, MD;  Location: [place];  Service: Orthopedics;  Laterality: Left;  ? LAPAROSCOPIC HYSTERECTOMY N/A 03/09/2017  ? Procedure: HYSTERECTOMY TOTAL LAPAROSCOPIC;  Surgeon: Megan Salon, MD;  Location: [facility];  Service: Gynecology;  Laterality: N/A;  ? LAPAROSCOPIC SALPINGO OOPHERECTOMY Bilateral 03/09/2017  ? Procedure: LAPAROSCOPIC SALPINGO OOPHORECTOMY;  Surgeon: Megan Salon, MD;  Location: [facility];  Service: Gynecology;  Laterality: Bilateral;  ? OPEN REDUCTION INTERNAL FIXATION (ORIF) DISTAL RADIAL FRACTURE Left 04/12/2021  ? Procedure: OPEN REDUCTION INTERNAL FIXATION (ORIF) LEFT DISTAL RADIUS FRACTURE;  Surgeon: Sherilyn Cooter, MD;  Location: [place];  Service: Orthopedics;  Laterality: Left;  ? TUBAL LIGATION  1983  ? WRIST FRACTURE SURGERY Right 2008   ? ? ?Her Family History Is Significant For: ?Family History  ?Problem Relation Age of Onset  ? Osteoporosis Mother   ? High blood pressure Mother   ? High Cholesterol Mother   ? Depression Mother   ? Sudden death Father   ? Alcohol abuse Father   ? Sleep apnea Neg Hx   ? ? ?Her Social History Is Significant For: ?Social History  ? ?Socioeconomic History  ? Marital status: Married  ?  Spouse name: Deidre Ala  ? Number of children: 1  ? Years of education: Not on file  ? Highest education level: Not on file  ?Occupational History  ? Occupation: Arboriculturist - Sales  ?Tobacco Use  ? Smoking status: Former  ?  Packs/day: 0.50  ?  Types: Cigarettes  ?  Quit date: 08/04/1984  ?  Years since quitting: 37.3  ? Smokeless tobacco: Never  ?Vaping Use  ? Vaping Use: Never  used  ?Substance and Sexual Activity  ? Alcohol use: Not Currently  ?  Comment: occ  ? Drug use: No  ? Sexual activity: Not Currently  ?  Partners: Male  ?  Birth control/protection: Post-menopausal, Surgical  ?  Comment: [place] 03/09/17   ?Other Topics Concern  ? Not on file  ?Social History Narrative  ? Not on file  ? ?Social Determinants of Health  ? ?Financial Resource Strain: Not on file  ?Food Insecurity: Not on file  ?Transportation Needs: Not on file  ?Physical Activity: Not on file  ?Stress: Not on file  ?Social Connections: Not on file  ? ? ?Her Allergies Are:  ?Allergies  ?Allergen Reactions  ? Bee Venom Anaphylaxis, Swelling and Rash  ? Penicillins Swelling, Rash and Other (See Comments)  ?  Tongue swelling ?Has patient had a PCN reaction causing immediate rash, facial/tongue/throat swelling, SOB or lightheadedness with hypotension:Yes ?Has patient had a PCN reaction causing severe rash involving mucus membranes or skin necrosis:No ?Has patient had a PCN reaction that required hospitalization:Treated & released in ER--No ?Has patient had a PCN reaction occurring within the last 10 years:No ?If all of the above answers are "NO", then may proceed with  Cephalosporin use. ?  ?:  ? ?Her Current Medications Are:  ?Outpatient Encounter Medications as of 12/10/2021  ?Medication Sig  ? acyclovir (ZOVIRAX) 800 MG tablet Take 800 mg by mouth daily.   ? Calcium Carb-Cholecalcifer

## 2021-12-31 DIAGNOSIS — D1801 Hemangioma of skin and subcutaneous tissue: Secondary | ICD-10-CM | POA: Diagnosis not present

## 2021-12-31 DIAGNOSIS — D2262 Melanocytic nevi of left upper limb, including shoulder: Secondary | ICD-10-CM | POA: Diagnosis not present

## 2021-12-31 DIAGNOSIS — D2261 Melanocytic nevi of right upper limb, including shoulder: Secondary | ICD-10-CM | POA: Diagnosis not present

## 2021-12-31 DIAGNOSIS — L814 Other melanin hyperpigmentation: Secondary | ICD-10-CM | POA: Diagnosis not present

## 2022-01-24 ENCOUNTER — Other Ambulatory Visit (HOSPITAL_BASED_OUTPATIENT_CLINIC_OR_DEPARTMENT_OTHER): Payer: Self-pay | Admitting: Obstetrics & Gynecology

## 2022-01-24 DIAGNOSIS — Z1231 Encounter for screening mammogram for malignant neoplasm of breast: Secondary | ICD-10-CM

## 2022-01-31 ENCOUNTER — Ambulatory Visit (HOSPITAL_BASED_OUTPATIENT_CLINIC_OR_DEPARTMENT_OTHER): Payer: BC Managed Care – PPO | Admitting: Radiology

## 2022-02-14 ENCOUNTER — Ambulatory Visit (HOSPITAL_BASED_OUTPATIENT_CLINIC_OR_DEPARTMENT_OTHER)
Admission: RE | Admit: 2022-02-14 | Discharge: 2022-02-14 | Disposition: A | Discharge status: Home / Self Care | Payer: BC Managed Care – PPO | Source: Ambulatory Visit | Attending: Obstetrics & Gynecology | Admitting: Obstetrics & Gynecology

## 2022-02-14 DIAGNOSIS — Z1231 Encounter for screening mammogram for malignant neoplasm of breast: Secondary | ICD-10-CM

## 2022-02-26 DIAGNOSIS — K219 Gastro-esophageal reflux disease without esophagitis: Secondary | ICD-10-CM | POA: Diagnosis not present

## 2022-02-26 DIAGNOSIS — Z23 Encounter for immunization: Secondary | ICD-10-CM | POA: Diagnosis not present

## 2022-02-26 DIAGNOSIS — E7800 Pure hypercholesterolemia, unspecified: Secondary | ICD-10-CM | POA: Diagnosis not present

## 2022-03-12 ENCOUNTER — Encounter (INDEPENDENT_AMBULATORY_CARE_PROVIDER_SITE_OTHER): Payer: Self-pay

## 2022-04-17 ENCOUNTER — Ambulatory Visit (HOSPITAL_BASED_OUTPATIENT_CLINIC_OR_DEPARTMENT_OTHER): Payer: BC Managed Care – PPO | Admitting: Obstetrics & Gynecology

## 2022-04-21 ENCOUNTER — Ambulatory Visit (HOSPITAL_BASED_OUTPATIENT_CLINIC_OR_DEPARTMENT_OTHER): Payer: BC Managed Care – PPO | Admitting: Obstetrics & Gynecology

## 2022-08-01 ENCOUNTER — Encounter (HOSPITAL_BASED_OUTPATIENT_CLINIC_OR_DEPARTMENT_OTHER): Payer: Self-pay | Admitting: Obstetrics & Gynecology

## 2022-08-01 ENCOUNTER — Ambulatory Visit (INDEPENDENT_AMBULATORY_CARE_PROVIDER_SITE_OTHER): Payer: PPO | Admitting: Obstetrics & Gynecology

## 2022-08-01 VITALS — BP 179/93 | HR 86 | Ht 70.0 in | Wt 216.2 lb

## 2022-08-01 DIAGNOSIS — L280 Lichen simplex chronicus: Secondary | ICD-10-CM

## 2022-08-01 DIAGNOSIS — Z23 Encounter for immunization: Secondary | ICD-10-CM

## 2022-08-01 DIAGNOSIS — Z01419 Encounter for gynecological examination (general) (routine) without abnormal findings: Secondary | ICD-10-CM

## 2022-08-01 DIAGNOSIS — M85831 Other specified disorders of bone density and structure, right forearm: Secondary | ICD-10-CM | POA: Diagnosis not present

## 2022-08-01 DIAGNOSIS — E785 Hyperlipidemia, unspecified: Secondary | ICD-10-CM | POA: Diagnosis not present

## 2022-08-01 DIAGNOSIS — Z8249 Family history of ischemic heart disease and other diseases of the circulatory system: Secondary | ICD-10-CM

## 2022-08-01 DIAGNOSIS — Z86010 Personal history of colonic polyps: Secondary | ICD-10-CM

## 2022-08-01 DIAGNOSIS — B009 Herpesviral infection, unspecified: Secondary | ICD-10-CM

## 2022-08-01 DIAGNOSIS — M85832 Other specified disorders of bone density and structure, left forearm: Secondary | ICD-10-CM

## 2022-08-01 DIAGNOSIS — R030 Elevated blood-pressure reading, without diagnosis of hypertension: Secondary | ICD-10-CM

## 2022-08-01 DIAGNOSIS — Z90710 Acquired absence of both cervix and uterus: Secondary | ICD-10-CM

## 2022-08-01 NOTE — Progress Notes (Unsigned)
66 y.o. G7P1011 Married White or Caucasian female here for breast and pelvic exam.  I am also following her for ***.  Denies vaginal bleeding.  She is in a clinical trial at [facility].  She has done a bone density as part of the trial.  Reports this wasn't bad and will bring copy to me.    Patient's last menstrual period was 08/04/2006.          Sexually active: No.  H/O STD:  no  Health Maintenance: PCP:  Emmie Niemann.  Last wellness appt was July 202.  Did blood work at that appt:   Vaccines are up to date:  reviewed and updated today Colonoscopy:  09/2018, follow up 3 years.  Referral done MMG:  02/2022 BMD:  done with a clinical trial Last pap smear:  2017 done before hysterectomy.   H/o abnormal pap smear:  no    reports that she quit smoking about 38 years ago. Her smoking use included cigarettes. She smoked an average of .5 packs per day. She has never used smokeless tobacco. She reports that she does not currently use alcohol. She reports that she does not use drugs.  Past Medical History:  Diagnosis Date   Anxiety    Broken wrist 2008   Cancer [facility])    basal cell   Chest pain 2018   seen by Dr. Ellyn Hack.  Has mid LAD stenosis.   Chest pain    Constipation    GERD (gastroesophageal reflux disease)    occasional uses tums   High cholesterol    In total cholesterol and triglycerides.   High triglycerides    HSV-1 infection    Joint pain    Lichen simplex chronicus    Sleep apnea    Swallowing difficulty    Vitamin D deficiency     Past Surgical History:  Procedure Laterality Date   BREAST BIOPSY  5/12   fibrocystic changes   CYSTOSCOPY N/A 03/09/2017   Procedure: CYSTOSCOPY;  Surgeon: Megan Salon, MD;  Location: [facility];  Service: Gynecology;  Laterality: N/A;   DILATATION & CURETTAGE/HYSTEROSCOPY WITH MYOSURE N/A 12/26/2016   Procedure: [place];  Surgeon: Megan Salon, MD;  Location: [facility];   Service: Gynecology;  Laterality: N/A;   DILATION AND CURETTAGE OF UTERUS     "years ago"   FACIAL COSMETIC SURGERY     HAND SURGERY Right 2012   fractured bone in hands   HARDWARE REMOVAL Left 06/18/2021   Procedure: REMOVAL OF HARDWARE LEFT DISTAL RADIUS FRACTURE;  Surgeon: Sherilyn Cooter, MD;  Location: [place];  Service: Orthopedics;  Laterality: Left;   LAPAROSCOPIC HYSTERECTOMY N/A 03/09/2017   Procedure: HYSTERECTOMY TOTAL LAPAROSCOPIC;  Surgeon: Megan Salon, MD;  Location: [facility];  Service: Gynecology;  Laterality: N/A;   LAPAROSCOPIC SALPINGO OOPHERECTOMY Bilateral 03/09/2017   Procedure: LAPAROSCOPIC SALPINGO OOPHORECTOMY;  Surgeon: Megan Salon, MD;  Location: [facility];  Service: Gynecology;  Laterality: Bilateral;   OPEN REDUCTION INTERNAL FIXATION (ORIF) DISTAL RADIAL FRACTURE Left 04/12/2021   Procedure: OPEN REDUCTION INTERNAL FIXATION (ORIF) LEFT DISTAL RADIUS FRACTURE;  Surgeon: Sherilyn Cooter, MD;  Location: [place];  Service: Orthopedics;  Laterality: Left;   TUBAL LIGATION  1983   WRIST FRACTURE SURGERY Right 2008    Current Outpatient Medications  Medication Sig Dispense Refill   acyclovir (ZOVIRAX) 800 MG tablet Take 800 mg by mouth daily.  Calcium Carb-Cholecalciferol (CALCIUM + D3 PO) Take 1 tablet by mouth daily.     Cholecalciferol (VITAMIN D) 2000 UNITS CAPS Take 2,000 Units by mouth daily.      pantoprazole (PROTONIX) 40 MG tablet Take 1 tablet by mouth daily.     rosuvastatin (CRESTOR) 20 MG tablet Take 40 mg by mouth daily.     vitamin B-12 (CYANOCOBALAMIN) 1000 MCG tablet Take 1,000 mcg by mouth daily.     vitamin E 1000 UNIT capsule Take 1,000 Units by mouth daily.     No current facility-administered medications for this visit.    Family History  Problem Relation Age of Onset   Osteoporosis Mother    High blood pressure Mother    High Cholesterol Mother    Depression  Mother    Sudden death Father    Alcohol abuse Father    Sleep apnea Neg Hx     Review of Systems  Constitutional: Negative.   Genitourinary: Negative.     Exam:   BP (!) 146/88 (BP Location: Right Arm, Patient Position: Sitting, Cuff Size: Large)   Pulse 81   Ht '5\' 10"'$  (1.778 m) Comment: Reported  Wt 216 lb 3.2 oz (98.1 kg)   LMP 08/04/2006   BMI 31.02 kg/m   Height: '5\' 10"'$  (177.8 cm) (Reported)  General appearance: alert, cooperative and appears stated age Breasts: normal appearance, no masses or tenderness Abdomen: soft, non-tender; bowel sounds normal; no masses,  no organomegaly Lymph nodes: Cervical, supraclavicular, and axillary nodes normal.  No abnormal inguinal nodes palpated Neurologic: Grossly normal  Pelvic: External genitalia:  no lesions              Urethra:  normal appearing urethra with no masses, tenderness or lesions              Bartholins and Skenes: normal                 Vagina: normal appearing vagina with atrophic changes and no discharge, no lesions              Cervix: absent              Pap taken: No. Bimanual Exam:  Uterus:  uterus absent              Adnexa: no mass, fullness, tenderness               Rectovaginal: Confirms               Anus:  normal sphincter tone, no lesions  Chaperone, Octaviano Batty, CMA, was present for exam.  Assessment/Plan: 1. Encntr for gyn exam (general) (routine) w/o abn findings ***  2. History of colon polyps *** - Ambulatory referral to Gastroenterology  3. Influenza vaccine needed *** - Flu Vaccine QUAD High Dose(Fluad)  4. Elevated lipids *** - Ambulatory referral to Cardiology  5. Family history of heart disease *** - Ambulatory referral to Cardiology  6. Osteopenia of both forearms ***

## 2022-08-03 DIAGNOSIS — B009 Herpesviral infection, unspecified: Secondary | ICD-10-CM | POA: Insufficient documentation

## 2022-08-19 ENCOUNTER — Ambulatory Visit (INDEPENDENT_AMBULATORY_CARE_PROVIDER_SITE_OTHER): Payer: PPO

## 2022-08-19 ENCOUNTER — Ambulatory Visit (HOSPITAL_BASED_OUTPATIENT_CLINIC_OR_DEPARTMENT_OTHER): Payer: PPO | Admitting: Cardiology

## 2022-08-19 ENCOUNTER — Ambulatory Visit: Payer: PPO | Admitting: Orthopaedic Surgery

## 2022-08-19 ENCOUNTER — Encounter (HOSPITAL_BASED_OUTPATIENT_CLINIC_OR_DEPARTMENT_OTHER): Payer: Self-pay | Admitting: Cardiology

## 2022-08-19 ENCOUNTER — Encounter: Payer: Self-pay | Admitting: Orthopaedic Surgery

## 2022-08-19 VITALS — BP 112/72 | HR 88 | Ht 70.0 in | Wt 220.4 lb

## 2022-08-19 DIAGNOSIS — M25571 Pain in right ankle and joints of right foot: Secondary | ICD-10-CM

## 2022-08-19 DIAGNOSIS — Z713 Dietary counseling and surveillance: Secondary | ICD-10-CM | POA: Diagnosis not present

## 2022-08-19 DIAGNOSIS — E6609 Other obesity due to excess calories: Secondary | ICD-10-CM

## 2022-08-19 DIAGNOSIS — R079 Chest pain, unspecified: Secondary | ICD-10-CM

## 2022-08-19 DIAGNOSIS — E782 Mixed hyperlipidemia: Secondary | ICD-10-CM

## 2022-08-19 DIAGNOSIS — Z7189 Other specified counseling: Secondary | ICD-10-CM | POA: Diagnosis not present

## 2022-08-19 DIAGNOSIS — E66811 Obesity, class 1: Secondary | ICD-10-CM

## 2022-08-19 DIAGNOSIS — Z6830 Body mass index (BMI) 30.0-30.9, adult: Secondary | ICD-10-CM

## 2022-08-19 DIAGNOSIS — Z7182 Exercise counseling: Secondary | ICD-10-CM

## 2022-08-19 MED ORDER — METOPROLOL TARTRATE 50 MG PO TABS: ORAL_TABLET | ORAL | 0 refills | Status: DC

## 2022-08-19 MED ORDER — PREDNISONE 10 MG (21) PO TBPK: ORAL_TABLET | ORAL | 3 refills | Status: DC

## 2022-08-19 MED ORDER — DICLOFENAC SODIUM 75 MG PO TBEC: 75.0000 mg | DELAYED_RELEASE_TABLET | Freq: Two times a day (BID) | ORAL | 2 refills | Status: DC

## 2022-08-19 NOTE — Patient Instructions (Addendum)
Medication Instructions:  TAKE METOPROLOL 50 MG 2 HOURS PRIOR TO CARDIAC CT   Labwork: BMET TODAY   Testing/Procedures: Your physician has requested that you have cardiac CT. Cardiac computed tomography (CT) is a painless test that uses an x-ray machine to take clear, detailed pictures of your heart. For further information please visit [email or web address]. Please follow instruction sheet as given.  Follow-Up: 4-6 WEEKS WITH DR Harrell Gave   Any Other Special Instructions Will Be Listed Below (If Applicable).    Your cardiac CT will be scheduled at one of the below locations:   [facility] [address] [place], [place] [address] ([phone] [place] [address] [place], [place] [address] [phone]  [facility] [place], [place] [address] [phone]  If scheduled at [facility], please arrive at the [facility] and Children's Entrance (Entrance C2) of [facility] 30 minutes prior to test start time. You can use the FREE valet parking offered at entrance C (encouraged to control the heart rate for the test)  Proceed to the [facility] Radiology Department (first floor) to check-in and test prep.  All radiology patients and guests should use entrance C2 at [facility], accessed from [facility], even though the hospital's physical address listed is [address].    If scheduled at [facility] or [facility], please arrive 15 mins early for check-in and test prep.   Please follow these instructions carefully (unless otherwise directed):  Hold all erectile dysfunction medications at least 3 days (72 hrs) prior to test. (Ie viagra, cialis, sildenafil, tadalafil, etc) We will administer nitroglycerin during this exam.   On the Night Before the Test: Be sure to  Drink plenty of water. Do not consume any caffeinated/decaffeinated beverages or chocolate 12 hours prior to your test. Do not take any antihistamines 12 hours prior to your test.  On the Day of the Test: Drink plenty of water until 1 hour prior to the test. Do not eat any food 1 hour prior to test. You may take your regular medications prior to the test.  Take metoprolol (Lopressor) two hours prior to test. HOLD Furosemide/Hydrochlorothiazide morning of the test. FEMALES- please wear underwire-free bra if available, avoid dresses & tight clothing      After the Test: Drink plenty of water. After receiving IV contrast, you may experience a mild flushed feeling. This is normal. On occasion, you may experience a mild rash up to 24 hours after the test. This is not dangerous. If this occurs, you can take Benadryl 25 mg and increase your fluid intake. If you experience trouble breathing, this can be serious. If it is severe call 911 IMMEDIATELY. If it is mild, please call our office. If you take any of these medications: Glipizide/Metformin, Avandament, Glucavance, please do not take 48 hours after completing test unless otherwise instructed.  We will call to schedule your test 2-4 weeks out understanding that some insurance companies will need an authorization prior to the service being performed.   For non-scheduling related questions, please contact the cardiac imaging nurse navigator should you have any questions/concerns: Marchia Bond, Cardiac Imaging Nurse Navigator Gordy Clement, Cardiac Imaging Nurse Navigator [facility] Direct Office Dial: [phone]   For scheduling needs, including cancellations and rescheduling, please call [place], [phone].  Cardiac CT Angiogram A cardiac CT  angiogram is a procedure to look at the heart and the area around the heart. It may be done to help find the cause of chest pains or other symptoms of heart disease.  During this procedure, a substance called contrast dye is injected into the blood vessels in the area to be checked. A large X-ray machine, called a CT scanner, then takes detailed pictures of the heart and the surrounding area. The procedure is also sometimes called a coronary CT angiogram, coronary artery scanning, or CTA. A cardiac CT angiogram allows the health care provider to see how well blood is flowing to and from the heart. The health care provider will be able to see if there are any problems, such as: Blockage or narrowing of the coronary arteries in the heart. Fluid around the heart. Signs of weakness or disease in the muscles, valves, and tissues of the heart. Tell a health care provider about: Any allergies you have. This is especially important if you have had a previous allergic reaction to contrast dye. All medicines you are taking, including vitamins, herbs, eye drops, creams, and over-the-counter medicines. Any blood disorders you have. Any surgeries you have had. Any medical conditions you have. Whether you are pregnant or may be pregnant. Any anxiety disorders, chronic pain, or other conditions you have that may increase your stress or prevent you from lying still. What are the risks? Generally, this is a safe procedure. However, problems may occur, including: Bleeding. Infection. Allergic reactions to medicines or dyes. Damage to other structures or organs. Kidney damage from the contrast dye that is used. Increased risk of cancer from radiation exposure. This risk is low. Talk with your health care provider about: The risks and benefits of testing. How you can receive the lowest dose of radiation. What happens before the procedure? Wear comfortable clothing and remove any jewelry, glasses, dentures, and hearing aids. Follow instructions from your health care provider about eating and drinking. This may include: For 12 hours before the procedure -- avoid caffeine.  This includes tea, coffee, soda, energy drinks, and diet pills. Drink plenty of water or other fluids that do not have caffeine in them. Being well hydrated can prevent complications. For 4-6 hours before the procedure -- stop eating and drinking. The contrast dye can cause nausea, but this is less likely if your stomach is empty. Ask your health care provider about changing or stopping your regular medicines. This is especially important if you are taking diabetes medicines, blood thinners, or medicines to treat problems with erections (erectile dysfunction). What happens during the procedure?  Hair on your chest may need to be removed so that small sticky patches called electrodes can be placed on your chest. These will transmit information that helps to monitor your heart during the procedure. An IV will be inserted into one of your veins. You might be given a medicine to control your heart rate during the procedure. This will help to ensure that good images are obtained. You will be asked to lie on an exam table. This table will slide in and out of the CT machine during the procedure. Contrast dye will be injected into the IV. You might feel warm, or you may get a metallic taste in your mouth. You will be given a medicine called nitroglycerin. This will relax or dilate the arteries in your heart. The table that you are lying on will move into the CT machine tunnel for the scan. The person running the machine will  give you instructions while the scans are being done. You may be asked to: Keep your arms above your head. Hold your breath. Stay very still, even if the table is moving. When the scanning is complete, you will be moved out of the machine. The IV will be removed. The procedure may vary among health care providers and hospitals. What can I expect after the procedure? After your procedure, it is common to have: A metallic taste in your mouth from the contrast dye. A feeling of  warmth. A headache from the nitroglycerin. Follow these instructions at home: Take over-the-counter and prescription medicines only as told by your health care provider. If you are told, drink enough fluid to keep your urine pale yellow. This will help to flush the contrast dye out of your body. Most people can return to their normal activities right after the procedure. Ask your health care provider what activities are safe for you. It is up to you to get the results of your procedure. Ask your health care provider, or the department that is doing the procedure, when your results will be ready. Keep all follow-up visits as told by your health care provider. This is important. Contact a health care provider if: You have any symptoms of allergy to the contrast dye. These include: Shortness of breath. Rash or hives. A racing heartbeat. Summary A cardiac CT angiogram is a procedure to look at the heart and the area around the heart. It may be done to help find the cause of chest pains or other symptoms of heart disease. During this procedure, a large X-ray machine, called a CT scanner, takes detailed pictures of the heart and the surrounding area after a contrast dye has been injected into blood vessels in the area. Ask your health care provider about changing or stopping your regular medicines before the procedure. This is especially important if you are taking diabetes medicines, blood thinners, or medicines to treat erectile dysfunction. If you are told, drink enough fluid to keep your urine pale yellow. This will help to flush the contrast dye out of your body. This information is not intended to replace advice given to you by your health care provider. Make sure you discuss any questions you have with your health care provider. Document Revised: 11/07/2021 Document Reviewed: 03/16/2019 Elsevier Patient Education  [place].

## 2022-08-19 NOTE — Progress Notes (Signed)
Cardiology Office Note:    Date:  08/20/2022   ID:  Meredith Shannon, Meredith Shannon Oct 15, 1955, MRN [id number]  PCP:  Aletha Halim., PA-C  Cardiologist:  Buford Dresser, MD  Referring MD: Megan Salon, MD   CC:  New patient evaluation for elevated lipids  History of Present Illness:    Meredith Shannon is a 67 y.o. female with a hx of CAD, hyperlipidemia, GERD, cancer, sleep apnea, and anxiety, who is seen as a new consult at the request of Megan Salon, MD for the evaluation and management of elevated lipids.   She was seen by Dr. Sabra Heck on 08/01/2022. Lipid panel 05/2022 revealed LDL 130, triglycerides 311, and HDL 63. The patient was unsure if cholesterol medication was the best option for her. They reviewed her prior coronary CT testing and she was referred to cardiology for further evaluation. Incidentally her blood pressure was elevated to 179/93 at that visit.  Previously seen in cardiology by Dr. Ellyn Hack in 2018 for chest pain. Reassuring results with coronary CTA suggested no significant coronary disease. It was felt her symptoms did not sound consistent with exertional angina, but she may have had some microvascular coronary spasm. More for diagnostic purposes, she was prescribed prn nitroglycerin to see if it would help.   Cardiovascular risk factors: Prior clinical ASCVD: Coronary CTA 2018 showed coronary artery calcium score 2.1 Agatston units, 67th percentile. Mixed plaque in ostial LAD without significant stenosis. Mixed plaque in mid LAD, mild stenosis. No obstructive disease. Comorbid conditions:  Hyperlipidemia -She states her cholesterol has not been well controlled with medication. She has been on rosuvastatin 20 mg for years; increased to 40 mg a few months ago. Reportedly her triglycerides have remained elevated. She feels her cholesterol is mostly genetic. LDL 130 as of 05/2022. Reportedly she had more recent lipids with similar numbers In December due to participating  in a clinical trial.  Metabolic syndrome/Obesity:  For several months [facility]. Family history: Her mother had hyperlipidemia.  Prior cardiac testing and/or incidental findings on other testing (ie coronary calcium): CCTA 2018 showed coronary artery calcium score 2.1 Agatston units, 67th percentile. Exercise level: For a few months last Summer she was going to the [employer] routinely. Lately she was diagnosed with an ankle fracture which limits her exercise. Current diet:  In clinic today her blood pressure is 112/72. At home she notices stable readings as high as 130/80. At her prior visit with Dr. Sabra Heck her blood pressure was 179/93 which she states is atypical.   She continues to experience intermittent chest discomfort. This is described as "almost like a burning sensation" in her central chest, with radiation upwards to her neck and jaw. It usually occurs at rest, not during strenuous activity. Her episodes are of different intensities and duration, but have never lasted longer than 10-15 minutes. Generally the episodes are very sporadic without any known triggers, correlations, or aggravating/alleviating factors. At a minimum, she endorses 2-3 episodes a month. She may have chest discomfort once a week, or may have 2 weeks with no episodes. Of note, her chest discomfort now is very similar to her prior symptoms in 2018.  In the past 2-3 months she complains of some dizziness/lightheadedness. Although this usually resolves in a few seconds, it seems to be occurring more frequently. While quilting at night, her lightheadedness will occur several times while sitting. While sitting on the exam table today, she did become lightheaded  or "weightless" spontaneously. This resolved after a brief time.  She denies any palpitations, shortness of breath, or peripheral edema. No headaches, orthopnea, or PND.  Past Medical History:  Diagnosis Date   Anxiety     Broken wrist 2008   Cancer [facility])    basal cell   Chest pain 2018   seen by Dr. Ellyn Hack.  Has mid LAD stenosis.   Chest pain    Constipation    GERD (gastroesophageal reflux disease)    occasional uses tums   High cholesterol    In total cholesterol and triglycerides.   High triglycerides    HSV-1 infection    Joint pain    Lichen simplex chronicus    Sleep apnea    Swallowing difficulty    Vitamin D deficiency     Past Surgical History:  Procedure Laterality Date   BREAST BIOPSY  5/12   fibrocystic changes   CYSTOSCOPY N/A 03/09/2017   Procedure: CYSTOSCOPY;  Surgeon: Megan Salon, MD;  Location: [facility];  Service: Gynecology;  Laterality: N/A;   DILATATION & CURETTAGE/HYSTEROSCOPY WITH MYOSURE N/A 12/26/2016   Procedure: [place];  Surgeon: Megan Salon, MD;  Location: [facility];  Service: Gynecology;  Laterality: N/A;   DILATION AND CURETTAGE OF UTERUS     "years ago"   FACIAL COSMETIC SURGERY     HAND SURGERY Right 2012   fractured bone in hands   HARDWARE REMOVAL Left 06/18/2021   Procedure: REMOVAL OF HARDWARE LEFT DISTAL RADIUS FRACTURE;  Surgeon: Sherilyn Cooter, MD;  Location: [facility];  Service: Orthopedics;  Laterality: Left;   LAPAROSCOPIC HYSTERECTOMY N/A 03/09/2017   Procedure: HYSTERECTOMY TOTAL LAPAROSCOPIC;  Surgeon: Megan Salon, MD;  Location: [facility];  Service: Gynecology;  Laterality: N/A;   LAPAROSCOPIC SALPINGO OOPHERECTOMY Bilateral 03/09/2017   Procedure: LAPAROSCOPIC SALPINGO OOPHORECTOMY;  Surgeon: Megan Salon, MD;  Location[facility];  Service: Gynecology;  Laterality: Bilateral;   OPEN REDUCTION INTERNAL FIXATION (ORIF) DISTAL RADIAL FRACTURE Left 04/12/2021   Procedure: OPEN REDUCTION INTERNAL FIXATION (ORIF) LEFT DISTAL RADIUS FRACTURE;  Surgeon: Sherilyn Cooter, MD;  Location: [facility];  Service: Orthopedics;   Laterality: Left;   TUBAL LIGATION  1983   WRIST FRACTURE SURGERY Right 2008    Current Medications: Current Outpatient Medications on File Prior to Visit  Medication Sig   acyclovir (ZOVIRAX) 800 MG tablet Take 800 mg by mouth daily.    Calcium Carb-Cholecalciferol (CALCIUM + D3 PO) Take 1 tablet by mouth daily.   Cholecalciferol (VITAMIN D) 2000 UNITS CAPS Take 2,000 Units by mouth daily.    pantoprazole (PROTONIX) 40 MG tablet Take 1 tablet by mouth daily.   rosuvastatin (CRESTOR) 20 MG tablet Take 40 mg by mouth daily.   vitamin B-12 (CYANOCOBALAMIN) 1000 MCG tablet Take 1,000 mcg by mouth daily.   vitamin E 1000 UNIT capsule Take 1,000 Units by mouth daily.   No current facility-administered medications on file prior to visit.     Allergies:   Bee venom and Penicillins   Social History   Tobacco Use   Smoking status: Former    Packs/day: 0.50    Types: Cigarettes    Quit date: 08/04/1984    Years since quitting: 38.0   Smokeless tobacco: Never  Vaping Use   Vaping Use: Never used  Substance Use Topics   Alcohol use: Not Currently    Comment: occ   Drug  use: No    Family History: family history includes Alcohol abuse in her father; Depression in her mother; High Cholesterol in her mother; High blood pressure in her mother; Osteoporosis in her mother; Sudden death in her father. There is no history of Sleep apnea.  ROS:   Please see the history of present illness.  Additional pertinent ROS: Constitutional: Negative for chills, fever, night sweats, unintentional weight loss  HENT: Negative for ear pain and hearing loss.   Eyes: Negative for loss of vision and eye pain.  Respiratory: Negative for cough, sputum, wheezing.   Cardiovascular: See HPI. Gastrointestinal: Negative for abdominal pain, melena, and hematochezia.  Genitourinary: Negative for dysuria and hematuria.  Musculoskeletal: Negative for falls and myalgias.  Skin: Negative for itching and rash.   Neurological: Negative for focal weakness, focal sensory changes and loss of consciousness.  Endo/Heme/Allergies: Does not bruise/bleed easily.     EKGs/Labs/Other Studies Reviewed:    The following studies were reviewed today:  Cardiac CTA  09/24/2016: IMPRESSION: 1. Coronary artery calcium score 2.1 Agatston units, placing the patient in the 67th percentile for age and gender. This suggests intermediate risk for future cardiac events.   2.  Mild mid LAD stenosis.  No obstructive disease.  EKG:  EKG is personally reviewed.   08/19/2022:  NSR at 88 bpm  Recent Labs: 08/19/2022: BUN 21; Creatinine, Ser 0.84; Potassium 4.2; Sodium 144   Recent Lipid Panel    Component Value Date/Time   CHOL 255 (H) 08/15/2020 1406   TRIG 118 08/15/2020 1406   HDL 68 08/15/2020 1406   CHOLHDL 3.5 03/02/2020 1022   LDLCALC 166 (H) 08/15/2020 1406    Physical Exam:    VS:  BP 112/72 (BP Location: Left Arm, Patient Position: Sitting, Cuff Size: Large)   Pulse 88   Ht '5\' 10"'$  (1.778 m)   Wt 220 lb 6.4 oz (100 kg)   LMP 08/04/2006   BMI 31.62 kg/m     Wt Readings from Last 3 Encounters:  08/19/22 220 lb 6.4 oz (100 kg)  08/01/22 216 lb 3.2 oz (98.1 kg)  12/10/21 221 lb (100.2 kg)    GEN: Well nourished, well developed in no acute distress HEENT: Normal, moist mucous membranes NECK: No JVD CARDIAC: regular rhythm, normal S1 and S2, no rubs or gallops. No murmur. VASCULAR: Radial and DP pulses 2+ bilaterally. No carotid bruits RESPIRATORY:  Clear to auscultation without rales, wheezing or rhonchi  ABDOMEN: Soft, non-tender, non-distended MUSCULOSKELETAL:  Ambulates independently SKIN: Warm and dry, no edema NEUROLOGIC:  Alert and oriented x 3. No focal neuro deficits noted. PSYCHIATRIC:  Normal affect    ASSESSMENT:    1. Chest pain of uncertain etiology   2. Mixed hyperlipidemia   3. Cardiac risk counseling   4. Counseling on health promotion and disease prevention   5.  Nutritional counseling   6. Exercise counseling   7. Class 1 obesity due to excess calories with serious comorbidity and body mass index (BMI) of 30.0 to 30.9 in adult    PLAN:    Chest pain Mixed hyperlipidemia -discussed treadmill stress, nuclear stress/lexiscan, and CT coronary angiography. Discussed pros and cons of each, including but not limited to false positive/false negative risk, radiation risk, and risk of IV contrast dye. Based on shared decision making, decision was made to pursue CT coronary angiography. -will give one time dose of metoprolol 2 hours prior to scheduled test -counseled on need to get BMET prior to test -counseled on  use of sublingual nitroglycerin and its importance to a good test  -reviewed red flag warning signs that need immediate attention -prior coronary CT in 2018 reviewed, Ca score 2, trivial plaque in LAD. Has been 6 years, if disease has progressed significantly would intensify cholesterol treatment. Discussed both fibrates and PCSK9i -spent significant time discussing how diet/exercise relate to triglycerides -with obesity, hyperlipidemia, abnormal glucose handling (prior A1c 5.6), and potentially CAD, we discussed metabolic syndrome today. She would benefit from [facility] for weight loss; unfortunately she has Medicare coverage. If medicare begins covering GLP1 or she develops formal diabetes, would readdress.  Cardiac risk counseling and prevention recommendations: -recommend heart healthy/Mediterranean diet, with whole grains, fruits, vegetable, fish, lean meats, nuts, and olive oil. Limit salt. -recommend moderate walking, 3-5 times/week for 30-50 minutes each session. Aim for at least 150 minutes.week. Goal should be pace of 3 miles/hours, or walking 1.5 miles in 30 minutes -recommend avoidance of tobacco products. Avoid excess alcohol. -ASCVD risk score: The 10-year ASCVD risk score (Arnett DK, et al., 2019) is: 4.4%   Values used to calculate the  score:     Age: 48 years     Sex: Female     Is Non-Hispanic African American: No     Diabetic: No     Tobacco smoker: No     Systolic Blood Pressure: 283 mmHg     Is BP treated: No     HDL Cholesterol: 63 MG/DL     Total Cholesterol: 227 MG/DL    Plan for follow up: 4-6 weeks or sooner as needed.  Buford Dresser, MD, PhD, [facility]    Medication Adjustments/Labs and Tests Ordered: Current medicines are reviewed at length with the patient today.  Concerns regarding medicines are outlined above.   Orders Placed This Encounter  Procedures   CT CORONARY MORPH W/CTA COR W/SCORE W/CA W/CM &/OR WO/CM   Basic metabolic panel   EKG 15-VVOH   Meds ordered this encounter  Medications   metoprolol tartrate (LOPRESSOR) 50 MG tablet    Sig: TAKE 1 TABLET 2 HOURS PRIOR TO CT    Dispense:  1 tablet    Refill:  0   Patient Instructions  Medication Instructions:  TAKE METOPROLOL 50 MG 2 HOURS PRIOR TO CARDIAC CT   Labwork: BMET TODAY   Testing/Procedures: Your physician has requested that you have cardiac CT. Cardiac computed tomography (CT) is a painless test that uses an x-ray machine to take clear, detailed pictures of your heart. For further information please visit [email or web address]. Please follow instruction sheet as given.  Follow-Up: 4-6 WEEKS WITH DR Harrell Gave   Any Other Special Instructions Will Be Listed Below (If Applicable).    Your cardiac CT will be scheduled at one of the below locations:   [facility] [address] [place], [place] [address] [phone]) [place] [address] [place], [place] [address] [phone]  [facility], [place] [address] [phone]  If scheduled at [facility], please arrive at the [facility] and Children's Entrance (Entrance C2) of [facility] 30  minutes prior to test start time. You can use the FREE valet parking offered at entrance C (encouraged to control the heart rate for the test)  Proceed to the [facility] Radiology Department (first floor) to check-in and test prep.  All radiology patients and guests should use  entrance C2 at [facility], accessed from [facility], even though the hospital's physical address listed is [address]    If scheduled at [facility] or [facility], please arrive 15 mins early for check-in and test prep.   Please follow these instructions carefully (unless otherwise directed):  Hold all erectile dysfunction medications at least 3 days (72 hrs) prior to test. (Ie viagra, cialis, sildenafil, tadalafil, etc) We will administer nitroglycerin during this exam.   On the Night Before the Test: Be sure to Drink plenty of water. Do not consume any caffeinated/decaffeinated beverages or chocolate 12 hours prior to your test. Do not take any antihistamines 12 hours prior to your test.  On the Day of the Test: Drink plenty of water until 1 hour prior to the test. Do not eat any food 1 hour prior to test. You may take your regular medications prior to the test.  Take metoprolol (Lopressor) two hours prior to test. HOLD Furosemide/Hydrochlorothiazide morning of the test. FEMALES- please wear underwire-free bra if available, avoid dresses & tight clothing      After the Test: Drink plenty of water. After receiving IV contrast, you may experience a mild flushed feeling. This is normal. On occasion, you may experience a mild rash up to 24 hours after the test. This is not dangerous. If this occurs, you can take Benadryl 25 mg and increase your fluid intake. If you experience trouble breathing, this can be serious. If it is severe call 911 IMMEDIATELY. If it is mild, please call our office. If you take any of these medications:  Glipizide/Metformin, Avandament, Glucavance, please do not take 48 hours after completing test unless otherwise instructed.  We will call to schedule your test 2-4 weeks out understanding that some insurance companies will need an authorization prior to the service being performed.   For non-scheduling related questions, please contact the cardiac imaging nurse navigator should you have any questions/concerns: Marchia Bond, Cardiac Imaging Nurse Navigator Gordy Clement, Cardiac Imaging Nurse Navigator [facility] Direct Office Dial: [phone]   For scheduling needs, including cancellations and rescheduling, please call [place], [phone].  Cardiac CT Angiogram A cardiac CT angiogram is a procedure to look at the heart and the area around the heart. It may be done to help find the cause of chest pains or other symptoms of heart disease. During this procedure, a substance called contrast dye is injected into the blood vessels in the area to be checked. A large X-ray machine, called a CT scanner, then takes detailed pictures of the heart and the surrounding area. The procedure is also sometimes called a coronary CT angiogram, coronary artery scanning, or CTA. A cardiac CT angiogram allows the health care provider to see how well blood is flowing to and from the heart. The health care provider will be able to see if there are any problems, such as: Blockage or narrowing of the coronary arteries in the heart. Fluid around the heart. Signs of weakness or disease in the muscles, valves, and tissues of the heart. Tell a health care provider about: Any allergies you have. This is especially important if you have had a previous allergic reaction to contrast dye. All medicines you are taking, including vitamins, herbs, eye drops, creams, and over-the-counter medicines. Any blood disorders you have. Any surgeries you have had. Any medical conditions you have. Whether  you are pregnant or may be pregnant. Any anxiety  disorders, chronic pain, or other conditions you have that may increase your stress or prevent you from lying still. What are the risks? Generally, this is a safe procedure. However, problems may occur, including: Bleeding. Infection. Allergic reactions to medicines or dyes. Damage to other structures or organs. Kidney damage from the contrast dye that is used. Increased risk of cancer from radiation exposure. This risk is low. Talk with your health care provider about: The risks and benefits of testing. How you can receive the lowest dose of radiation. What happens before the procedure? Wear comfortable clothing and remove any jewelry, glasses, dentures, and hearing aids. Follow instructions from your health care provider about eating and drinking. This may include: For 12 hours before the procedure -- avoid caffeine. This includes tea, coffee, soda, energy drinks, and diet pills. Drink plenty of water or other fluids that do not have caffeine in them. Being well hydrated can prevent complications. For 4-6 hours before the procedure -- stop eating and drinking. The contrast dye can cause nausea, but this is less likely if your stomach is empty. Ask your health care provider about changing or stopping your regular medicines. This is especially important if you are taking diabetes medicines, blood thinners, or medicines to treat problems with erections (erectile dysfunction). What happens during the procedure?  Hair on your chest may need to be removed so that small sticky patches called electrodes can be placed on your chest. These will transmit information that helps to monitor your heart during the procedure. An IV will be inserted into one of your veins. You might be given a medicine to control your heart rate during the procedure. This will help to ensure that good images are obtained. You will be asked to lie on an exam table. This table  will slide in and out of the CT machine during the procedure. Contrast dye will be injected into the IV. You might feel warm, or you may get a metallic taste in your mouth. You will be given a medicine called nitroglycerin. This will relax or dilate the arteries in your heart. The table that you are lying on will move into the CT machine tunnel for the scan. The person running the machine will give you instructions while the scans are being done. You may be asked to: Keep your arms above your head. Hold your breath. Stay very still, even if the table is moving. When the scanning is complete, you will be moved out of the machine. The IV will be removed. The procedure may vary among health care providers and hospitals. What can I expect after the procedure? After your procedure, it is common to have: A metallic taste in your mouth from the contrast dye. A feeling of warmth. A headache from the nitroglycerin. Follow these instructions at home: Take over-the-counter and prescription medicines only as told by your health care provider. If you are told, drink enough fluid to keep your urine pale yellow. This will help to flush the contrast dye out of your body. Most people can return to their normal activities right after the procedure. Ask your health care provider what activities are safe for you. It is up to you to get the results of your procedure. Ask your health care provider, or the department that is doing the procedure, when your results will be ready. Keep all follow-up visits as told by your health care provider. This is important. Contact a health care provider if: You have any symptoms of allergy  to the contrast dye. These include: Shortness of breath. Rash or hives. A racing heartbeat. Summary A cardiac CT angiogram is a procedure to look at the heart and the area around the heart. It may be done to help find the cause of chest pains or other symptoms of heart disease. During  this procedure, a large X-ray machine, called a CT scanner, takes detailed pictures of the heart and the surrounding area after a contrast dye has been injected into blood vessels in the area. Ask your health care provider about changing or stopping your regular medicines before the procedure. This is especially important if you are taking diabetes medicines, blood thinners, or medicines to treat erectile dysfunction. If you are told, drink enough fluid to keep your urine pale yellow. This will help to flush the contrast dye out of your body. This information is not intended to replace advice given to you by your health care provider. Make sure you discuss any questions you have with your health care provider. Document Revised: 11/07/2021 Document Reviewed: 03/16/2019 Elsevier Patient Education  [place].    I,Mathew Stumpf,acting as a Education administrator for [employer], MD.,have documented all relevant documentation on the behalf of Buford Dresser, MD,as directed by  Buford Dresser, MD while in the presence of Buford Dresser, MD.  I, Buford Dresser, MD, have reviewed all documentation for this visit. The documentation on 08/20/22 for the exam, diagnosis, procedures, and orders are all accurate and complete.   Signed, Buford Dresser, MD PhD 08/20/2022 10:20 AM    [place]

## 2022-08-19 NOTE — Progress Notes (Signed)
Office Visit Note   Patient: Meredith Shannon           Date of Birth: 04-Jun-1956           MRN: [id number] Visit Date: 08/19/2022              Requested by: Aletha Meredith Shannon., PA-C [address]  [place] [address] PCP: Aletha Meredith Shannon., PA-C   Assessment & Plan: Visit Diagnoses:  1. Pain in right ankle and joints of right foot     Plan: Impression is right ankle pain.  Difficult to say if this is extensor tenosynovitis versus stress fracture although seems to be a odd location for this.  Based on options we agreed to empirically immobilized with cam boot for couple weeks and place her on oral anti-inflammatories.  She can wean out of boot as symptoms allow.  If she does not feel any improvement she will notify me if she wants to move forward with an MRI.  Follow-Up Instructions: No follow-ups on file.   Orders:  Orders Placed This Encounter  Procedures   XR Ankle Complete Right   Meds ordered this encounter  Medications   predniSONE (STERAPRED UNI-PAK 21 TAB) 10 MG (21) TBPK tablet    Sig: Take as directed    Dispense:  21 tablet    Refill:  3   diclofenac (VOLTAREN) 75 MG EC tablet    Sig: Take 1 tablet (75 mg total) by mouth 2 (two) times daily.    Dispense:  30 tablet    Refill:  2      Procedures: No procedures performed   Clinical Data: No additional findings.   Subjective: Chief Complaint  Patient presents with   Right Ankle - Pain    HPI Meredith Shannon is a 67 year old female comes in for evaluation of right ankle pain for about 3 weeks.  Hurts and swells anteriorly.  Denies any injuries.  Has been using ice and ibuprofen and activity modification.  She has a remote history of a stress fracture in the distal tibia but unsure if this is the same side of the contralateral side. Denies any numbness and tingling. Review of Systems  Constitutional: Negative.   HENT: Negative.    Eyes: Negative.   Respiratory: Negative.    Cardiovascular: Negative.    Endocrine: Negative.   Musculoskeletal: Negative.   Neurological: Negative.   Hematological: Negative.   Psychiatric/Behavioral: Negative.    All other systems reviewed and are negative.    Objective: Vital Signs: LMP 08/04/2006   Physical Exam Vitals and nursing note reviewed.  Constitutional:      Appearance: She is well-developed.  HENT:     Head: Normocephalic and atraumatic.  Pulmonary:     Effort: Pulmonary effort is normal.  Abdominal:     Palpations: Abdomen is soft.  Musculoskeletal:     Cervical back: Neck supple.  Skin:    General: Skin is warm.     Capillary Refill: Capillary refill takes less than 2 seconds.  Neurological:     Mental Status: She is alert and oriented to person, place, and time.  Psychiatric:        Behavior: Behavior normal.        Thought Content: Thought content normal.        Judgment: Judgment normal.     Ortho Exam Examination right ankle shows moderate swelling over the anterior medial aspect.  She has tenderness along the tibialis anterior and medial gutter.  She has pain with resisted ankle dorsiflexion and passive stretch.  No signs of infection. Specialty Comments:  No specialty comments available.  Imaging: XR Ankle Complete Right  Result Date: 08/19/2022 Negative for acute findings.    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 Ellyn Hack.  Has mid LAD stenosis.   Chest pain    Constipation    GERD (gastroesophageal reflux disease)    occasional uses tums   High cholesterol    In total cholesterol and triglycerides.   High triglycerides    HSV-1 infection    Joint pain    Lichen simplex chronicus    Sleep apnea    Swallowing difficulty    Vitamin D deficiency     Family History  Problem Relation Age of Onset   Osteoporosis Mother    High blood pressure Mother    High Cholesterol Mother    Depression Mother    Sudden death Father    Alcohol abuse Father    Sleep apnea Neg Hx     Past Surgical History:  Procedure Laterality Date   BREAST BIOPSY  5/12   fibrocystic changes   CYSTOSCOPY N/A 03/09/2017   Procedure: CYSTOSCOPY;  Surgeon: Megan Salon, MD;  Location: [facility];  Service: Gynecology;  Laterality: N/A;   DILATATION & CURETTAGE/HYSTEROSCOPY WITH MYOSURE N/A 12/26/2016   Procedure: [place];  Surgeon: Megan Salon, MD;  Location: [facility];  Service: Gynecology;  Laterality: N/A;   DILATION AND CURETTAGE OF UTERUS     "years ago"   FACIAL COSMETIC SURGERY     HAND SURGERY Right 2012   fractured bone in hands   HARDWARE REMOVAL Left 06/18/2021   Procedure: REMOVAL OF HARDWARE LEFT DISTAL RADIUS FRACTURE;  Surgeon: Sherilyn Cooter, MD;  Location: [place];  Service: Orthopedics;  Laterality: Left;   LAPAROSCOPIC HYSTERECTOMY N/A 03/09/2017   Procedure: HYSTERECTOMY TOTAL LAPAROSCOPIC;  Surgeon: Megan Salon, MD;  [facility];  Service: Gynecology;  Laterality: N/A;   LAPAROSCOPIC SALPINGO OOPHERECTOMY Bilateral 03/09/2017   Procedure: LAPAROSCOPIC SALPINGO OOPHORECTOMY;  Surgeon: Megan Salon, MD;   Location: [facility];  Service: Gynecology;  Laterality: Bilateral;   OPEN REDUCTION INTERNAL FIXATION (ORIF) DISTAL RADIAL FRACTURE Left 04/12/2021   Procedure: OPEN REDUCTION INTERNAL FIXATION (ORIF) LEFT DISTAL RADIUS FRACTURE;  Surgeon: Sherilyn Cooter, MD;  Location: [facility];  Service: Orthopedics;  Laterality: Left;   TUBAL LIGATION  1983   WRIST FRACTURE SURGERY Right 2008   Social History   Occupational History   Occupation: Arboriculturist - Sales  Tobacco  Use   Smoking status: Former    Packs/day: 0.50    Types: Cigarettes    Quit date: 08/04/1984    Years since quitting: 38.0   Smokeless tobacco: Never  Vaping Use   Vaping Use: Never used  Substance and Sexual Activity   Alcohol use: Not Currently    Comment: occ   Drug use: No   Sexual activity: Not Currently    Partners: Male    Birth control/protection: Post-menopausal, Surgical    Comment: [place] 03/09/17

## 2022-08-20 ENCOUNTER — Encounter (HOSPITAL_BASED_OUTPATIENT_CLINIC_OR_DEPARTMENT_OTHER): Payer: Self-pay | Admitting: Cardiology

## 2022-08-20 LAB — BASIC METABOLIC PANEL
BUN/Creatinine Ratio: 25 (ref 12–28)
BUN: 21 mg/dL (ref 8–27)
CO2: 22 mmol/L (ref 20–29)
Calcium: 10.1 mg/dL (ref 8.7–10.3)
Chloride: 104 mmol/L (ref 96–106)
Creatinine, Ser: 0.84 mg/dL (ref 0.57–1.00)
Glucose: 122 mg/dL — ABNORMAL HIGH (ref 70–99)
Potassium: 4.2 mmol/L (ref 3.5–5.2)
Sodium: 144 mmol/L (ref 134–144)
eGFR: 77 mL/min/{1.73_m2} (ref >59)

## 2022-08-25 ENCOUNTER — Telehealth (HOSPITAL_COMMUNITY): Payer: Self-pay | Admitting: Emergency Medicine

## 2022-08-25 NOTE — Telephone Encounter (Signed)
Reaching out to patient to offer assistance regarding upcoming cardiac imaging study; pt verbalizes understanding of appt date/time, parking situation and where to check in, pre-test NPO status and medications ordered, and verified current allergies; name and call back number provided for further questions should they arise Meredith Bond RN Navigator Cardiac Imaging [facility] [phone] office [phone] cell  Arrival 1130 WC Entrance Denies iv issues '50mg'$  metoprolol tartrate Aware contrast/nitro

## 2022-08-26 ENCOUNTER — Ambulatory Visit (HOSPITAL_COMMUNITY)
Admission: RE | Admit: 2022-08-26 | Discharge: 2022-08-26 | Disposition: A | Discharge status: Home / Self Care | Payer: PPO | Source: Ambulatory Visit | Attending: Cardiology | Admitting: Cardiology

## 2022-08-26 DIAGNOSIS — I2510 Atherosclerotic heart disease of native coronary artery without angina pectoris: Secondary | ICD-10-CM | POA: Insufficient documentation

## 2022-08-26 DIAGNOSIS — E782 Mixed hyperlipidemia: Secondary | ICD-10-CM | POA: Diagnosis present

## 2022-08-26 DIAGNOSIS — I700 Atherosclerosis of aorta: Secondary | ICD-10-CM

## 2022-08-26 DIAGNOSIS — R079 Chest pain, unspecified: Secondary | ICD-10-CM

## 2022-08-26 MED ORDER — NITROGLYCERIN 0.4 MG SL SUBL: 0.8000 mg | SUBLINGUAL_TABLET | SUBLINGUAL | Status: DC | PRN

## 2022-08-26 MED ORDER — NITROGLYCERIN 0.4 MG SL SUBL
SUBLINGUAL_TABLET | SUBLINGUAL | Status: AC
  Administered 2022-08-26: 0.8 mg via SUBLINGUAL
  Filled 2022-08-26: qty 2

## 2022-08-26 MED ORDER — IOHEXOL 350 MG/ML SOLN
100.0000 mL | Freq: Once | INTRAVENOUS | Status: AC | PRN
  Administered 2022-08-26: 100 mL via INTRAVENOUS

## 2022-09-03 ENCOUNTER — Encounter (HOSPITAL_BASED_OUTPATIENT_CLINIC_OR_DEPARTMENT_OTHER): Payer: Self-pay | Admitting: Obstetrics & Gynecology

## 2022-09-16 ENCOUNTER — Encounter (HOSPITAL_BASED_OUTPATIENT_CLINIC_OR_DEPARTMENT_OTHER): Payer: Self-pay | Admitting: Cardiology

## 2022-09-16 ENCOUNTER — Ambulatory Visit (HOSPITAL_BASED_OUTPATIENT_CLINIC_OR_DEPARTMENT_OTHER): Payer: PPO | Admitting: Cardiology

## 2022-09-16 VITALS — BP 122/82 | HR 91 | Ht 70.0 in | Wt 219.3 lb

## 2022-09-16 DIAGNOSIS — E782 Mixed hyperlipidemia: Secondary | ICD-10-CM | POA: Diagnosis not present

## 2022-09-16 DIAGNOSIS — Z712 Person consulting for explanation of examination or test findings: Secondary | ICD-10-CM

## 2022-09-16 DIAGNOSIS — Z7189 Other specified counseling: Secondary | ICD-10-CM

## 2022-09-16 DIAGNOSIS — R0789 Other chest pain: Secondary | ICD-10-CM | POA: Diagnosis not present

## 2022-09-16 DIAGNOSIS — I2510 Atherosclerotic heart disease of native coronary artery without angina pectoris: Secondary | ICD-10-CM

## 2022-09-16 NOTE — Progress Notes (Addendum)
Cardiology Office Note:    Date:  09/16/2022   ID:  Meredith Shannon, Meredith Shannon May 26, 1956, MRN [id number]  PCP:  Aletha Halim., PA-C  Cardiologist:  Buford Dresser, MD  Referring MD: Aletha Halim., PA-C   CC:  follow up  History of Present Illness:    Meredith Shannon is a 67 y.o. female with a hx of CAD, hyperlipidemia, GERD, cancer, sleep apnea, and anxiety, who is seen for follow up today. I initially met her 08/19/22 as a new consult at the request of Aletha Halim., PA-C for the evaluation and management of elevated lipids.   Cardiovascular risk factors: Prior clinical ASCVD: Coronary CTA 2018 showed coronary artery calcium score 2.1 Agatston units, 67th percentile. Mixed plaque in ostial LAD without significant stenosis. Mixed plaque in mid LAD, mild stenosis. No obstructive disease. Comorbid conditions:  Hyperlipidemia -She states her cholesterol has not been well controlled with medication. She has been on rosuvastatin 20 mg for years; increased to 40 mg a few months ago. Reportedly her triglycerides have remained elevated. She feels her cholesterol is mostly genetic. LDL 130 as of 05/2022. Reportedly she had more recent lipids with similar numbers In December due to participating in a clinical trial.  Metabolic syndrome/Obesity:  For several months she went [facility]. Family history: Her mother had hyperlipidemia.  Prior cardiac testing and/or incidental findings on other testing (ie coronary calcium): CCTA 2018 showed coronary artery calcium score 2.1 Agatston units, 67th percentile. Exercise level: For a few months last Summer she was going to the [employer] routinely. Lately she was diagnosed with an ankle fracture which limits her exercise.  Today: Reviewed results of her CT together at length. Ca score 89.5, minimal nonobstructive CAD. Suggests chest pain not likely cardiac in etiology.  Was on 20 mg rosuvastatin for labs 05/2022  and 40 mg when she had labs done for the clinical trial in 07/2022.   Denies shortness of breath at rest or with normal exertion. No PND, orthopnea, LE edema or unexpected weight gain. No syncope or palpitations.   Past Medical History:  Diagnosis Date   Anxiety    Broken wrist 2008   Cancer [facility])    basal cell   Chest pain 2018   seen by Dr. Ellyn Hack.  Has mid LAD stenosis.   Chest pain    Constipation    GERD (gastroesophageal reflux disease)    occasional uses tums   High cholesterol    In total cholesterol and triglycerides.   High triglycerides    HSV-1 infection    Joint pain    Lichen simplex chronicus    Sleep apnea    Swallowing difficulty    Vitamin D deficiency     Past Surgical History:  Procedure Laterality Date   BREAST BIOPSY  5/12   fibrocystic changes   CYSTOSCOPY N/A 03/09/2017   Procedure: CYSTOSCOPY;  Surgeon: Megan Salon, MD;  [facility];  Service: Gynecology;  Laterality: N/A;   DILATATION & CURETTAGE/HYSTEROSCOPY WITH MYOSURE N/A 12/26/2016   Procedure: [place];  Surgeon: Megan Salon, MD;  Location: [facility];  Service: Gynecology;  Laterality: N/A;   DILATION AND CURETTAGE OF UTERUS     "years ago"   FACIAL COSMETIC SURGERY     HAND SURGERY Right 2012   fractured bone in hands   HARDWARE REMOVAL Left 06/18/2021   Procedure: REMOVAL OF HARDWARE LEFT DISTAL RADIUS  FRACTURE;  Surgeon: Sherilyn Cooter, MD;  Location: [place];  Service: Orthopedics;  Laterality: Left;   LAPAROSCOPIC HYSTERECTOMY N/A 03/09/2017   Procedure: HYSTERECTOMY TOTAL LAPAROSCOPIC;  Surgeon: Megan Salon, MD;  Location: [facility];  Service: Gynecology;  Laterality: N/A;   LAPAROSCOPIC SALPINGO OOPHERECTOMY Bilateral 03/09/2017   Procedure: LAPAROSCOPIC SALPINGO OOPHORECTOMY;  Surgeon: Megan Salon, MD;  Location[facility];  Service: Gynecology;  Laterality:  Bilateral;   OPEN REDUCTION INTERNAL FIXATION (ORIF) DISTAL RADIAL FRACTURE Left 04/12/2021   Procedure: OPEN REDUCTION INTERNAL FIXATION (ORIF) LEFT DISTAL RADIUS FRACTURE;  Surgeon: Sherilyn Cooter, MD;  Location: [place];  Service: Orthopedics;  Laterality: Left;   TUBAL LIGATION  1983   WRIST FRACTURE SURGERY Right 2008    Current Medications: Current Outpatient Medications on File Prior to Visit  Medication Sig   acyclovir (ZOVIRAX) 800 MG tablet Take 800 mg by mouth daily.    Calcium Carb-Cholecalciferol (CALCIUM + D3 PO) Take 1 tablet by mouth daily.   Cholecalciferol (VITAMIN D) 2000 UNITS CAPS Take 2,000 Units by mouth daily.    diclofenac (VOLTAREN) 75 MG EC tablet Take 1 tablet (75 mg total) by mouth 2 (two) times daily.   metoprolol tartrate (LOPRESSOR) 50 MG tablet TAKE 1 TABLET 2 HOURS PRIOR TO CT   pantoprazole (PROTONIX) 40 MG tablet Take 1 tablet by mouth daily.   predniSONE (STERAPRED UNI-PAK 21 TAB) 10 MG (21) TBPK tablet Take as directed   rosuvastatin (CRESTOR) 20 MG tablet Take 40 mg by mouth daily.   vitamin B-12 (CYANOCOBALAMIN) 1000 MCG tablet Take 1,000 mcg by mouth daily.   vitamin E 1000 UNIT capsule Take 1,000 Units by mouth daily.   No current facility-administered medications on file prior to visit.     Allergies:   Bee venom and Penicillins   Social History   Tobacco Use   Smoking status: Former    Packs/day: 0.50    Types: Cigarettes    Quit date: 08/04/1984    Years since quitting: 38.1   Smokeless tobacco: Never  Vaping Use   Vaping Use: Never used  Substance Use Topics   Alcohol use: Not Currently    Comment: occ   Drug use: No    Family History: family history includes Alcohol abuse in her father; Depression in her mother; High Cholesterol in her mother; High blood pressure in her mother; Osteoporosis in her mother; Sudden death in her father. There is no history of Sleep apnea.  ROS:   Please see the history of  present illness.  Additional pertinent ROS: Constitutional: Negative for chills, fever, night sweats, unintentional weight loss  HENT: Negative for ear pain and hearing loss.   Eyes: Negative for loss of vision and eye pain.  Respiratory: Negative for cough, sputum, wheezing.   Cardiovascular: See HPI. Gastrointestinal: Negative for abdominal pain, melena, and hematochezia.  Genitourinary: Negative for dysuria and hematuria.  Musculoskeletal: Negative for falls and myalgias.  Skin: Negative for itching and rash.  Neurological: Negative for focal weakness, focal sensory changes and loss of consciousness.  Endo/Heme/Allergies: Does not bruise/bleed easily.     EKGs/Labs/Other Studies Reviewed:    The following studies were reviewed today CT coronary 08/28/22 1. Coronary calcium score of 89.5. This was 80th percentile for age-, race-, and sex-matched controls.   2. Normal coronary origin with right dominance.   3. There is minimal (< 25%) calcified plaque in the RCA, lad, and LCX. CAD-RADS  1.   4.  Aortic atherosclerosis.  Cardiac CTA  09/24/2016: IMPRESSION: 1. Coronary artery calcium score 2.1 Agatston units, placing the patient in the 67th percentile for age and gender. This suggests intermediate risk for future cardiac events.   2.  Mild mid LAD stenosis.  No obstructive disease.  EKG:  EKG is personally reviewed.   08/19/2022:  NSR at 88 bpm  Recent Labs: 08/19/2022: BUN 21; Creatinine, Ser 0.84; Potassium 4.2; Sodium 144   Recent Lipid Panel    Component Value Date/Time   CHOL 255 (H) 08/15/2020 1406   TRIG 118 08/15/2020 1406   HDL 68 08/15/2020 1406   CHOLHDL 3.5 03/02/2020 1022   LDLCALC 166 (H) 08/15/2020 1406    Physical Exam:    VS:  BP 122/82 (BP Location: Left Arm, Patient Position: Sitting, Cuff Size: Large)   Pulse 91   Ht '5\' 10"'$  (1.778 m)   Wt 219 lb 4.8 oz (99.5 kg)   LMP 08/04/2006   SpO2 95%   BMI 31.47 kg/m     Wt Readings from Last 3  Encounters:  08/19/22 220 lb 6.4 oz (100 kg)  08/01/22 216 lb 3.2 oz (98.1 kg)  12/10/21 221 lb (100.2 kg)    GEN: Well nourished, well developed in no acute distress HEENT: Normal, moist mucous membranes NECK: No JVD CARDIAC: regular rhythm, normal S1 and S2, no rubs or gallops. No murmur. VASCULAR: Radial and DP pulses 2+ bilaterally. No carotid bruits RESPIRATORY:  Clear to auscultation without rales, wheezing or rhonchi  ABDOMEN: Soft, non-tender, non-distended MUSCULOSKELETAL:  Ambulates independently SKIN: Warm and dry, no edema NEUROLOGIC:  Alert and oriented x 3. No focal neuro deficits noted. PSYCHIATRIC:  Normal affect    ASSESSMENT:    1. Encounter to discuss test results   2. Mixed hyperlipidemia   3. Non-cardiac chest pain   4. Counseling on health promotion and disease prevention   5. Nonocclusive coronary atherosclerosis of native coronary artery     PLAN:    Chest pain -reviewed coronary ct results today, very reassuring. Suggests chest pain is not cardiac -reviewed red flag warning signs that need immediate attention  Nonobstructive CAD Mixed hyperlipidemia -Discussed both fibrates and PCSK9i. She is currently on 40 mg rosuvastatin -spent significant time discussing how diet/exercise relate to triglycerides -with obesity, hyperlipidemia, abnormal glucose handling (prior A1c 5.6), and potentially CAD, we discussed metabolic syndrome today. She would benefit from [facility] for weight loss; unfortunately she has Medicare coverage. If medicare begins covering GLP1 or she develops formal diabetes, would readdress. -discussed aspirin, will consider and let me know -Was on 20 mg rosuvastatin for labs 05/2022 and 40 mg when she had labs done for the clinical trial in 07/2022.  -check fasting lipids just prior to follow up in 6 mos  Cardiac risk counseling and prevention recommendations: -recommend heart healthy/Mediterranean diet, with whole grains, fruits, vegetable,  fish, lean meats, nuts, and olive oil. Limit salt. -recommend moderate walking, 3-5 times/week for 30-50 minutes each session. Aim for at least 150 minutes.week. Goal should be pace of 3 miles/hours, or walking 1.5 miles in 30 minutes -recommend avoidance of tobacco products. Avoid excess alcohol. -ASCVD risk score: The 10-year ASCVD risk score (Arnett DK, et al., 2019) is: 6%   Values used to calculate the score:     Age: 72 years     Sex: Female     Is Non-Hispanic African American: No     Diabetic: No  Tobacco smoker: No     Systolic Blood Pressure: 132 mmHg     Is BP treated: No     HDL Cholesterol: 63 MG/DL     Total Cholesterol: 227 MG/DL    Plan for follow up: 6 mos or sooner as needed  Jodelle Red, MD, PhD, [facility]  [facility]  [place]  [facility] V[facility] at [facility] at [facility] [address] [place], [place] [address] [phone]   Medication Adjustments/Labs and Tests Ordered: Current medicines are reviewed at length with the patient today.  Concerns regarding medicines are outlined above.   Orders Placed This Encounter  Procedures   Lipid panel   No orders of the defined types were placed in this encounter.  Patient Instructions  Medication Instructions:  Your physician recommends that you continue on your current medications as directed. Please refer to the Current Medication list given to you today.   *If you need a refill on your cardiac medications before your next appointment, please call your pharmacy*  Lab Work: FASTING LP IN 6 MONTHS 1 WEEK PRIOR TO FOLLOW UP  If you have labs (blood work) drawn today and your tests are completely normal, you will receive your results only by: MyChart Message (if you have MyChart) OR A paper copy in the mail If you have any lab test that is abnormal or we need to change your treatment, we will call you to review the  results.  Testing/Procedures: NONE  Follow-Up: At [facility], you and your health needs are our priority.  As part of our continuing mission to provide you with exceptional heart care, we have created designated Provider Care Teams.  These Care Teams include your primary Cardiologist (physician) and Advanced Practice Providers (APPs -  Physician Assistants and Nurse Practitioners) who all work together to provide you with the care you need, when you need it.  We recommend signing up for the patient portal called "MyChart".  Sign up information is provided on this After Visit Summary.  MyChart is used to connect with patients for Virtual Visits (Telemedicine).  Patients are able to view lab/test results, encounter notes, upcoming appointments, etc.  Non-urgent messages can be sent to your provider as well.   To learn more about what you can do with MyChart, go to [email or web address].    Your next appointment:   6 month(s)  Provider:   Jodelle Red, MD         Signed, Jodelle Red, MD PhD 09/16/2022     [facility]  Addended to add: Plaque volume reviewed as part of DECIDE registry. Total plaque volume 133 mm3.  Did you add a medication? No  If yes, how many? NA  If no, reason? Reason for not adding med: Other already optimizing based on coronary CT results  Did you remove a medication? No  Did you increase the dosage of any medication? No  Did you decrease the dosage of any medication? No  Did you refer to a specialist (i.e. lipid clinic, preventive cardiology, endocrinology)? No  Has patient seen plaque report? No

## 2022-09-16 NOTE — Patient Instructions (Signed)
Medication Instructions:  Your physician recommends that you continue on your current medications as directed. Please refer to the Current Medication list given to you today.   *If you need a refill on your cardiac medications before your next appointment, please call your pharmacy*  Lab Work: FASTING LP IN 6 MONTHS 1 WEEK PRIOR TO FOLLOW UP  If you have labs (blood work) drawn today and your tests are completely normal, you will receive your results only by: [place] (if you have MyChart) OR A paper copy in the mail If you have any lab test that is abnormal or we need to change your treatment, we will call you to review the results.  Testing/Procedures: NONE  Follow-Up: At [facility], you and your health needs are our priority.  As part of our continuing mission to provide you with exceptional heart care, we have created designated Provider Care Teams.  These Care Teams include your primary Cardiologist (physician) and Advanced Practice Providers (APPs -  Physician Assistants and Nurse Practitioners) who all work together to provide you with the care you need, when you need it.  We recommend signing up for the patient portal called "MyChart".  Sign up information is provided on this After Visit Summary.  MyChart is used to connect with patients for Virtual Visits (Telemedicine).  Patients are able to view lab/test results, encounter notes, upcoming appointments, etc.  Non-urgent messages can be sent to your provider as well.   To learn more about what you can do with MyChart, go to [email or web address].    Your next appointment:   6 month(s)  Provider:   Buford Dresser, MD

## 2022-09-23 ENCOUNTER — Telehealth: Payer: Self-pay | Admitting: Gastroenterology

## 2022-09-23 NOTE — Telephone Encounter (Signed)
Hi Dr. Tarri Glenn,    We received a referral for patient to have another colonoscopy, she does have GI history with Dr. Ulyses Amor office but she is not satisfied with there patient care. The patient is specifically asking for yo to continue her care if possible. Her records were obtained for you to review and advise on scheduling.   Thank you

## 2022-10-13 ENCOUNTER — Encounter: Payer: Self-pay | Admitting: Gastroenterology

## 2022-10-23 ENCOUNTER — Ambulatory Visit (AMBULATORY_SURGERY_CENTER): Payer: PPO

## 2022-10-23 VITALS — Ht 70.0 in | Wt 220.0 lb

## 2022-10-23 DIAGNOSIS — Z86010 Personal history of colonic polyps: Secondary | ICD-10-CM

## 2022-10-23 MED ORDER — SUTAB 1479-225-188 MG PO TABS: 12.0000 | ORAL_TABLET | ORAL | 0 refills | Status: DC

## 2022-10-23 NOTE — Progress Notes (Signed)
No egg or soy allergy known to patient  No issues known to pt with past sedation with any surgeries or procedures Patient denies ever being told they had issues or difficulty with intubation  No FH of Malignant Hyperthermia Pt is not on diet pills Pt is not on  home 02  Pt is not on blood thinners  Pt with occ. constipation BM at least 5-6 times a week   No A fib or A flutter Have any cardiac testing pending--no Pt instructed to use [email or web address].com or GoodRx for a price reduction on prep   Patient's chart reviewed by Meredith Shannon CNRA prior to previsit and patient appropriate for the [place].  Previsit completed and red dot placed by patient's name on their procedure day (on provider's schedule).

## 2022-10-24 ENCOUNTER — Encounter: Payer: Self-pay | Admitting: Gastroenterology

## 2022-11-11 ENCOUNTER — Encounter: Payer: Self-pay | Admitting: Gastroenterology

## 2022-11-11 ENCOUNTER — Ambulatory Visit (AMBULATORY_SURGERY_CENTER): Payer: PPO | Admitting: Gastroenterology

## 2022-11-11 VITALS — BP 148/80 | HR 75 | Temp 98.7°F | Resp 12 | Ht 70.0 in

## 2022-11-11 DIAGNOSIS — D122 Benign neoplasm of ascending colon: Secondary | ICD-10-CM | POA: Diagnosis not present

## 2022-11-11 DIAGNOSIS — Z86010 Personal history of colonic polyps: Secondary | ICD-10-CM

## 2022-11-11 DIAGNOSIS — Z09 Encounter for follow-up examination after completed treatment for conditions other than malignant neoplasm: Secondary | ICD-10-CM | POA: Diagnosis not present

## 2022-11-11 DIAGNOSIS — D123 Benign neoplasm of transverse colon: Secondary | ICD-10-CM

## 2022-11-11 DIAGNOSIS — D124 Benign neoplasm of descending colon: Secondary | ICD-10-CM

## 2022-11-11 MED ORDER — SODIUM CHLORIDE 0.9 % IV SOLN: 500.0000 mL | Freq: Once | INTRAVENOUS | Status: DC

## 2022-11-11 NOTE — Op Note (Signed)
[facility] Patient Name: Meredith Shannon Procedure Date: 11/11/2022 3:01 PM MRN: [id number] Endoscopist: Tressia Danas MD, MD, [id number] Age: 68 Referring MD:  Date of Birth: 1956-01-09 Gender: Female Account #: [id number] Procedure:                Colonoscopy Indications:              High risk colon cancer surveillance: Personal                            history of multiple (3 or more) adenomas                           Colonoscopy with Dr. Elnoria Howard in 2020 showed five 2 to                            4 mm polyps in the descending colon and a sending                            colon and a 10 mm polyp in the transverse colon.                            Repeat colonoscopy recommended in 3 years.                           Sister recently diagnosed with colon cancer in her                            64s. Medicines:                Monitored Anesthesia Care Procedure:                Pre-Anesthesia Assessment:                           - Prior to the procedure, a History and Physical                            was performed, and patient medications and                            allergies were reviewed. The patient's tolerance of                            previous anesthesia was also reviewed. The risks                            and benefits of the procedure and the sedation                            options and risks were discussed with the patient.                            All questions were answered, and informed consent  was obtained. Prior Anticoagulants: The patient has                            taken no anticoagulant or antiplatelet agents. ASA                            Grade Assessment: II - A patient with mild systemic                            disease. After reviewing the risks and benefits,                            the patient was deemed in satisfactory condition to                            undergo the procedure.                            After obtaining informed consent, the colonoscope                            was passed under direct vision. Throughout the                            procedure, the patient's blood pressure, pulse, and                            oxygen saturations were monitored continuously. The                            Olympus CF-HQ190L [id number]) Colonoscope was                            introduced through the anus and advanced to the the                            cecum, identified by appendiceal orifice and                            ileocecal valve. A second forward view of the right                            colon was performed. The colonoscopy was performed                            without difficulty. The patient tolerated the                            procedure well. The quality of the bowel                            preparation was good. The ileocecal valve,  appendiceal orifice, and rectum were photographed. Scope In: 3:14:14 PM Scope Out: 3:35:04 PM Scope Withdrawal Time: 0 hours 16 minutes 10 seconds  Total Procedure Duration: 0 hours 20 minutes 50 seconds  Findings:                 The perianal and digital rectal examinations were                            normal.                           Four sessile polyps were found in the descending                            colon, transverse colon, hepatic flexure and                            ascending colon. The polyps were 2 to 3 mm in size.                            These polyps were removed with a cold snare.                            Resection and retrieval were complete. Estimated                            blood loss was minimal.                           The exam was otherwise without abnormality on                            direct and retroflexion views. Complications:            No immediate complications. Estimated Blood Loss:     Estimated blood loss was minimal. Impression:               -  Four 2 to 3 mm polyps in the descending colon, in                            the transverse colon, at the hepatic flexure and in                            the ascending colon, removed with a cold snare.                            Resected and retrieved.                           - The examination was otherwise normal on direct                            and retroflexion views. Recommendation:           - Patient has a contact number available for  emergencies. The signs and symptoms of potential                            delayed complications were discussed with the                            patient. Return to normal activities tomorrow.                            Written discharge instructions were provided to the                            patient.                           - Resume previous diet.                           - Continue present medications.                           - Await pathology results.                           - Repeat colonoscopy in 3 years for surveillance.                           - Emerging evidence supports eating a diet of                            fruits, vegetables, grains, calcium, and yogurt                            while reducing red meat and alcohol may reduce the                            risk of colon cancer.                           - Thank you for allowing me to be involved in your                            colon cancer prevention. Tressia DanasKimberly Tyreak Reagle MD, MD 11/11/2022 3:42:19 PM This report has been signed electronically.

## 2022-11-11 NOTE — Progress Notes (Signed)
Referring Provider: Richmond Campbell., PA-C Primary Care Physician:  Richmond Campbell., PA-C  Indication for Procedure:  Colon cancer Surveillance   IMPRESSION:  Need for colon cancer surveillance Appropriate candidate for monitored anesthesia care  PLAN: Colonoscopy in the [facility] today   HPI: Meredith Shannon is a 67 y.o. female presents for surveillance colonoscopy.  Prior endoscopic history: Colonoscopy with Dr. Elnoria Howard in 2020 showed five 2 to 4 mm polyps in the descending colon and a sending colon and a 10 mm polyp in the transverse colon.  Repeat colonoscopy recommended in 3 years.  Sister with colon cancer in her 74s. No other known family history of colon cancer or polyps. No family history of uterine/endometrial cancer, pancreatic cancer or gastric/stomach cancer.   Past Medical History:  Diagnosis Date   Anxiety    Broken wrist 2008   Cancer [facility])    basal cell   Chest pain 2018   seen by Dr. Herbie Baltimore.  Has mid LAD stenosis.   Chest pain    Constipation    GERD (gastroesophageal reflux disease)    occasional uses tums   High cholesterol    In total cholesterol and triglycerides.   High triglycerides    HSV-1 infection    Joint pain    Lichen simplex chronicus    Sleep apnea    Swallowing difficulty    Vitamin D deficiency     Past Surgical History:  Procedure Laterality Date   BREAST BIOPSY  5/12   fibrocystic changes   CYSTOSCOPY N/A 03/09/2017   Procedure: CYSTOSCOPY;  Surgeon: Jerene Bears, MD;  Location: [facility];  Service: Gynecology;  Laterality: N/A;   DILATATION & CURETTAGE/HYSTEROSCOPY WITH MYOSURE N/A 12/26/2016   Procedure: DILATATION & CURETTAGE/HYSTEROSCOPY WITH MYOSURE;  Surgeon: Jerene Bears, MD;  Location: [facility];  Service: Gynecology;  Laterality: N/A;   DILATION AND CURETTAGE OF UTERUS     "years ago"   FACIAL COSMETIC SURGERY     HAND SURGERY Right 2012   fractured bone in hands   HARDWARE REMOVAL Left 06/18/2021    Procedure: REMOVAL OF HARDWARE LEFT DISTAL RADIUS FRACTURE;  Surgeon: Marlyne Beards, MD;  Location: [facility];  Service: Orthopedics;  Laterality: Left;   LAPAROSCOPIC HYSTERECTOMY N/A 03/09/2017   Procedure: HYSTERECTOMY TOTAL LAPAROSCOPIC;  Surgeon: Jerene Bears, MD;  Location: [facility];  Service: Gynecology;  Laterality: N/A;   LAPAROSCOPIC SALPINGO OOPHERECTOMY Bilateral 03/09/2017   Procedure: LAPAROSCOPIC SALPINGO OOPHORECTOMY;  Surgeon: Jerene Bears, MD;  Location: [facility]  Service: Gynecology;  Laterality: Bilateral;   OPEN REDUCTION INTERNAL FIXATION (ORIF) DISTAL RADIAL FRACTURE Left 04/12/2021   Procedure: OPEN REDUCTION INTERNAL FIXATION (ORIF) LEFT DISTAL RADIUS FRACTURE;  Surgeon: Marlyne Beards, MD;  Location: [facility];  Service: Orthopedics;  Laterality: Left;   TUBAL LIGATION  1983   WRIST FRACTURE SURGERY Right 2008    Current Outpatient Medications  Medication Sig Dispense Refill   acyclovir (ZOVIRAX) 800 MG tablet Take 800 mg by mouth daily.      Calcium Carb-Cholecalciferol (CALCIUM + D3 PO) Take 1 tablet by mouth daily.     Cholecalciferol (VITAMIN D) 2000 UNITS CAPS Take 2,000 Units by mouth daily.      pantoprazole (PROTONIX) 40 MG tablet Take 1 tablet by mouth daily.     rosuvastatin (CRESTOR) 40 MG tablet Take 40 mg by mouth daily.     Sodium Sulfate-Mag Sulfate-KCl (SUTAB) [phone]  MG TABS Take 12 tablets by mouth as directed. 24 tablet 0   vitamin B-12 (CYANOCOBALAMIN) 1000 MCG tablet Take 1,000 mcg by mouth daily.     vitamin E 1000 UNIT capsule Take 1,000 Units by mouth daily.     No current facility-administered medications for this visit.    Allergies as of 11/11/2022 - Review Complete 10/23/2022  Allergen Reaction Noted   Bee venom Anaphylaxis, Swelling, and Rash 03/09/2017   Penicillins Swelling, Rash, and Other (See Comments) 04/06/2013    Family History  Problem  Relation Age of Onset   Osteoporosis Mother    High blood pressure Mother    High Cholesterol Mother    Depression Mother    Sudden death Father    Alcohol abuse Father    Sleep apnea Neg Hx    Colon cancer Neg Hx    Colon polyps Neg Hx    Esophageal cancer Neg Hx    Rectal cancer Neg Hx    Stomach cancer Neg Hx      Physical Exam: General:   Alert,  well-nourished, pleasant and cooperative in NAD Head:  Normocephalic and atraumatic. Eyes:  Sclera clear, no icterus.   Conjunctiva pink. Mouth:  No deformity or lesions.   Neck:  Supple; no masses or thyromegaly. Lungs:  Clear throughout to auscultation.   No wheezes. Heart:  Regular rate and rhythm; no murmurs. Abdomen:  Soft, non-tender, nondistended, normal bowel sounds, no rebound or guarding.  Msk:  Symmetrical. No boney deformities LAD: No inguinal or umbilical LAD Extremities:  No clubbing or edema. Neurologic:  Alert and  oriented x4;  grossly nonfocal Skin:  No obvious rash or bruise. Psych:  Alert and cooperative. Normal mood and affect.     Studies/Results: No results found.    Humaira Sculley L. Orvan Falconer, MD, MPH 11/11/2022, 1:35 PM

## 2022-11-11 NOTE — Progress Notes (Signed)
Pt's states no medical or surgical changes since previsit or office visit. 

## 2022-11-11 NOTE — Patient Instructions (Signed)
Please read handouts provided. Continue present medications. Await pathology results. Repeat colonoscopy in 3 years for screening.   YOU HAD AN ENDOSCOPIC PROCEDURE TODAY AT THE [facility]:   Refer to the procedure report that was given to you for any specific questions about what was found during the examination.  If the procedure report does not answer your questions, please call your gastroenterologist to clarify.  If you requested that your care partner not be given the details of your procedure findings, then the procedure report has been included in a sealed envelope for you to review at your convenience later.  YOU SHOULD EXPECT: Some feelings of bloating in the abdomen. Passage of more gas than usual.  Walking can help get rid of the air that was put into your GI tract during the procedure and reduce the bloating. If you had a lower endoscopy (such as a colonoscopy or flexible sigmoidoscopy) you may notice spotting of blood in your stool or on the toilet paper. If you underwent a bowel prep for your procedure, you may not have a normal bowel movement for a few days.  Please Note:  You might notice some irritation and congestion in your nose or some drainage.  This is from the oxygen used during your procedure.  There is no need for concern and it should clear up in a day or so.  SYMPTOMS TO REPORT IMMEDIATELY:  Following lower endoscopy (colonoscopy or flexible sigmoidoscopy):  Excessive amounts of blood in the stool  Significant tenderness or worsening of abdominal pains  Swelling of the abdomen that is new, acute  Fever of 100F or higher.  For urgent or emergent issues, a gastroenterologist can be reached at any hour by calling [phone]. Do not use MyChart messaging for urgent concerns.    DIET:  We do recommend a small meal at first, but then you may proceed to your regular diet.  Drink plenty of fluids but you should avoid alcoholic beverages for 24  hours.  ACTIVITY:  You should plan to take it easy for the rest of today and you should NOT DRIVE or use heavy machinery until tomorrow (because of the sedation medicines used during the test).    FOLLOW UP: Our staff will call the number listed on your records the next business day following your procedure.  We will call around 7:15- 8:00 am to check on you and address any questions or concerns that you may have regarding the information given to you following your procedure. If we do not reach you, we will leave a message.     If any biopsies were taken you will be contacted by phone or by letter within the next 1-3 weeks.  Please call us at [phone] if you have not heard about the biopsies in 3 weeks.    SIGNATURES/CONFIDENTIALITY: You and/or your care partner have signed paperwork which will be entered into your electronic medical record.  These signatures attest to the fact that that the information above on your After Visit Summary has been reviewed and is understood.  Full responsibility of the confidentiality of this discharge information lies with you and/or your care-partner.  

## 2022-11-11 NOTE — Progress Notes (Signed)
Called to room to assist during endoscopic procedure.  Patient ID and intended procedure confirmed with present staff. Received instructions for my participation in the procedure from the performing physician.  

## 2022-11-12 ENCOUNTER — Telehealth: Payer: Self-pay

## 2022-11-12 NOTE — Telephone Encounter (Signed)
  Follow up Call-     11/11/2022    1:47 PM  Call back number  Post procedure Call Back phone  # [phone]  Permission to leave phone message Yes     Patient questions:  Do you have a fever, pain , or abdominal swelling? No. Pain Score  0 *  Have you tolerated food without any problems? Yes.    Have you been able to return to your normal activities? Yes.    Do you have any questions about your discharge instructions: Diet   No. Medications  No. Follow up visit  No.  Do you have questions or concerns about your Care? No.  Actions: * If pain score is 4 or above: No action needed, pain <4.

## 2022-11-17 ENCOUNTER — Encounter: Payer: Self-pay | Admitting: Gastroenterology

## 2023-02-02 ENCOUNTER — Other Ambulatory Visit (HOSPITAL_BASED_OUTPATIENT_CLINIC_OR_DEPARTMENT_OTHER): Payer: Self-pay | Admitting: Obstetrics & Gynecology

## 2023-02-02 DIAGNOSIS — Z1231 Encounter for screening mammogram for malignant neoplasm of breast: Secondary | ICD-10-CM

## 2023-02-17 ENCOUNTER — Ambulatory Visit (HOSPITAL_BASED_OUTPATIENT_CLINIC_OR_DEPARTMENT_OTHER)
Admission: RE | Admit: 2023-02-17 | Discharge: 2023-02-17 | Disposition: A | Discharge status: Home / Self Care | Payer: PPO | Source: Ambulatory Visit | Attending: Obstetrics & Gynecology | Admitting: Obstetrics & Gynecology

## 2023-02-17 DIAGNOSIS — Z1231 Encounter for screening mammogram for malignant neoplasm of breast: Secondary | ICD-10-CM | POA: Diagnosis present

## 2023-03-06 ENCOUNTER — Ambulatory Visit (HOSPITAL_BASED_OUTPATIENT_CLINIC_OR_DEPARTMENT_OTHER): Payer: PPO | Admitting: Cardiology

## 2023-08-20 ENCOUNTER — Ambulatory Visit (HOSPITAL_BASED_OUTPATIENT_CLINIC_OR_DEPARTMENT_OTHER): Payer: PPO | Admitting: Obstetrics & Gynecology

## 2023-09-14 NOTE — Progress Notes (Signed)
Office Visit Note   Patient: Meredith Shannon           Date of Birth: 02-11-1956           MRN: [id number] Visit Date: 09/15/2023              Requested by: Richmond Campbell., PA-C [address]  [place] [address] PCP: Richmond Campbell., PA-C   Assessment & Plan: Visit Diagnoses:  1. Pain in left wrist   2. Pain in left elbow     Plan: Meredith Shannon is a 68 year old female with recent fall onto the left upper extremity.  She has a hematoma over the proximal ulna.  X-rays are normal.  She has posttraumatic degenerative changes but I do not see anything acute on x-ray today.  It has felt better over the last few days.  I recommend immobilizing with a Velcro wrist splint for a week or 2 and then wean as tolerated.  Over-the-counter medications and ice as needed.  Follow-up as needed.  Follow-Up Instructions: No follow-ups on file.   Orders:  Orders Placed This Encounter  Procedures   XR Elbow Complete Left (3+View)   XR Wrist Complete Left   No orders of the defined types were placed in this encounter.     Procedures: No procedures performed   Clinical Data: No additional findings.   Subjective: Chief Complaint  Patient presents with   Left Wrist - Injury   Left Elbow - Injury    HPI Pain is a 68 year old female here for evaluation of left wrist and elbow pain status post ground-level fall while on vacation last week.  She is status post ORIF of the left wrist from 2 years ago by Dr. Frazier Butt.  She subsequently had hardware removal due to irritation.  First couple days after the fall were pretty bad in terms of symptoms.  He has felt better in the last couple days.  Review of Systems  Constitutional: Negative.   HENT: Negative.    Eyes: Negative.   Respiratory: Negative.    Cardiovascular: Negative.   Endocrine: Negative.   Musculoskeletal: Negative.   Neurological: Negative.   Hematological: Negative.   Psychiatric/Behavioral: Negative.    All other  systems reviewed and are negative.    Objective: Vital Signs: LMP 08/04/2006   Physical Exam Vitals and nursing note reviewed.  Constitutional:      Appearance: She is well-developed.  HENT:     Head: Normocephalic and atraumatic.  Pulmonary:     Effort: Pulmonary effort is normal.  Abdominal:     Palpations: Abdomen is soft.  Musculoskeletal:     Cervical back: Neck supple.  Skin:    General: Skin is warm.     Capillary Refill: Capillary refill takes less than 2 seconds.  Neurological:     Mental Status: She is alert and oriented to person, place, and time.  Psychiatric:        Behavior: Behavior normal.        Thought Content: Thought content normal.        Judgment: Judgment normal.     Ortho Exam Examination of the left wrist shows a fully healed surgical scar.  Moderate swelling and light bruising.  Range of motion is well-tolerated.  Examination of the left elbow shows soft tissue swelling over the proximal ulna.  Skin is intact.  She has full elbow range of motion without pain. Specialty Comments:  No specialty comments available.  Imaging: No  results found.   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   H/O: hysterectomy 04/14/2021   Distal radius fracture, left 04/09/2021   OSA (obstructive sleep apnea) 12/11/2020   Polyphagia 10/22/2020   Secondary erythrocytosis 09/14/2020   Class 1 obesity due to excess calories with body mass index (BMI) of 30.0 to 30.9 in adult 08/22/2020   Acid reflux 08/22/2020   Dysphagia 08/22/2020   Globus sensation 05/18/2018   Complex endometrial hyperplasia 03/09/2017   Chest pain with moderate risk for cardiac etiology 08/26/2016   Dyspareunia, female 06/03/2016   Lichen simplex chronicus 06/01/2015   Hyperlipidemia 06/01/2015   Other and unspecified hyperlipidemia 04/14/2014   Past Medical History:  Diagnosis Date   Anxiety    Broken wrist 2008   Cancer [facility])     basal cell   Chest pain 2018   seen by Dr. Herbie Baltimore.  Has mid LAD stenosis.   Chest pain    Constipation    GERD (gastroesophageal reflux disease)    occasional uses tums   High cholesterol    In total cholesterol and triglycerides.   High triglycerides    HSV-1 infection    Joint pain    Lichen simplex chronicus    Sleep apnea    Swallowing difficulty    Vitamin D deficiency     Family History  Problem Relation Age of Onset   Osteoporosis Mother    High blood pressure Mother    High Cholesterol Mother    Depression Mother    Sudden death Father    Alcohol abuse Father    Sleep apnea Neg Hx    Colon cancer Neg Hx    Colon polyps Neg Hx    Esophageal cancer Neg Hx    Rectal cancer Neg Hx    Stomach cancer Neg Hx     Past Surgical History:  Procedure Laterality Date   BREAST BIOPSY  5/12   fibrocystic changes   CYSTOSCOPY N/A 03/09/2017   Procedure: CYSTOSCOPY;  Surgeon: Jerene Bears, MD;  Location: [facility];  Service: Gynecology;  Laterality: N/A;   DILATATION & CURETTAGE/HYSTEROSCOPY WITH MYOSURE N/A 12/26/2016   Procedure: DILATATION & CURETTAGE/HYSTEROSCOPY WITH MYOSURE;  Surgeon: Jerene Bears, MD;  Location: [facility];  Service: Gynecology;  Laterality: N/A;   DILATION AND CURETTAGE OF UTERUS     "years ago"   FACIAL COSMETIC SURGERY     HAND SURGERY Right 2012   fractured bone in hands   HARDWARE REMOVAL Left 06/18/2021   Procedure: REMOVAL OF HARDWARE LEFT DISTAL RADIUS FRACTURE;  Surgeon: Marlyne Beards, MD;  Location: [facility];  Service: Orthopedics;  Laterality: Left;   LAPAROSCOPIC HYSTERECTOMY N/A 03/09/2017   Procedure: HYSTERECTOMY TOTAL LAPAROSCOPIC;  Surgeon: Jerene Bears, MD;  Location: [facility];  Service: Gynecology;  Laterality: N/A;   LAPAROSCOPIC SALPINGO OOPHERECTOMY Bilateral 03/09/2017   Procedure: LAPAROSCOPIC SALPINGO OOPHORECTOMY;  Surgeon: Jerene Bears, MD;  Location: [facility];  Service: Gynecology;  Laterality: Bilateral;   OPEN REDUCTION INTERNAL FIXATION (ORIF) DISTAL RADIAL FRACTURE Left 04/12/2021   Procedure: OPEN REDUCTION INTERNAL FIXATION (ORIF) LEFT DISTAL RADIUS FRACTURE;  Surgeon: Marlyne Beards, MD;  Location: [facility];  Service: Orthopedics;  Laterality: Left;   TUBAL LIGATION  1983   WRIST FRACTURE SURGERY Right 2008   Social History   Occupational History   Occupation: Teacher, music - Airline pilot  Tobacco Use   Smoking status: Former    Current packs/day: 0.00    Types: Cigarettes    Quit date: 08/04/1984    Years since quitting: 39.1   Smokeless tobacco: Never  Vaping Use   Vaping status: Never Used  Substance and Sexual Activity   Alcohol use: Yes    Comment: occ   Drug use: No   Sexual activity: Not Currently    Partners: Male    Birth control/protection: Post-menopausal, Surgical    Comment: TL[facility] 03/09/17

## 2023-09-15 ENCOUNTER — Ambulatory Visit: Payer: PPO | Admitting: Orthopaedic Surgery

## 2023-09-15 ENCOUNTER — Encounter: Payer: Self-pay | Admitting: Orthopaedic Surgery

## 2023-09-15 ENCOUNTER — Other Ambulatory Visit (INDEPENDENT_AMBULATORY_CARE_PROVIDER_SITE_OTHER): Payer: PPO

## 2023-09-15 DIAGNOSIS — M25532 Pain in left wrist: Secondary | ICD-10-CM

## 2023-09-15 DIAGNOSIS — M25522 Pain in left elbow: Secondary | ICD-10-CM | POA: Diagnosis not present

## 2023-11-19 IMAGING — XA DG FLUORO GUIDE NDL PLC/BX
2 series · 2 of 2 positions shown · non-contrast
Comparison: none

CLINICAL DATA: Chronic pain.  Previous fall  with wrist fracture

EXAM:
EXAM
LEFT SHOULDER INJECTION UNDER FLUOROSCOPY FOR MRI
FLUOROSCOPY TIME:  30 seconds; 9.71 u5ymZ DAP
TECHNIQUE: The procedure, risks (including but not limited to bleeding,
infection, organ damage ), benefits, and alternatives were explained
to the patient. Questions regarding the procedure were encouraged
and answered. The patient understands and consents to the procedure.

[Series 1: ortho adipose · 1 of 1 slices shown (1 of 2)]
[im 1/1]
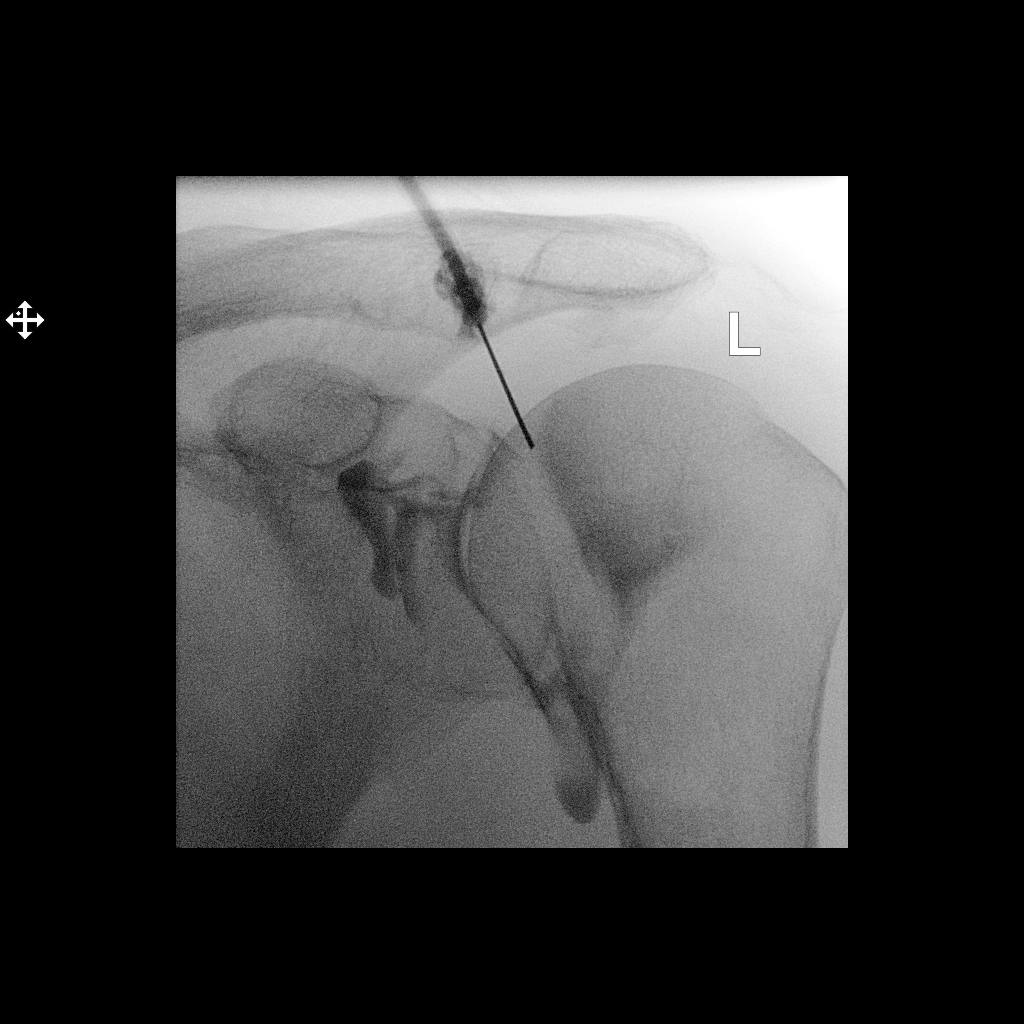

[Series 2: ortho adipose · 1 of 1 slices shown (2 of 2)]
[im 1/1]
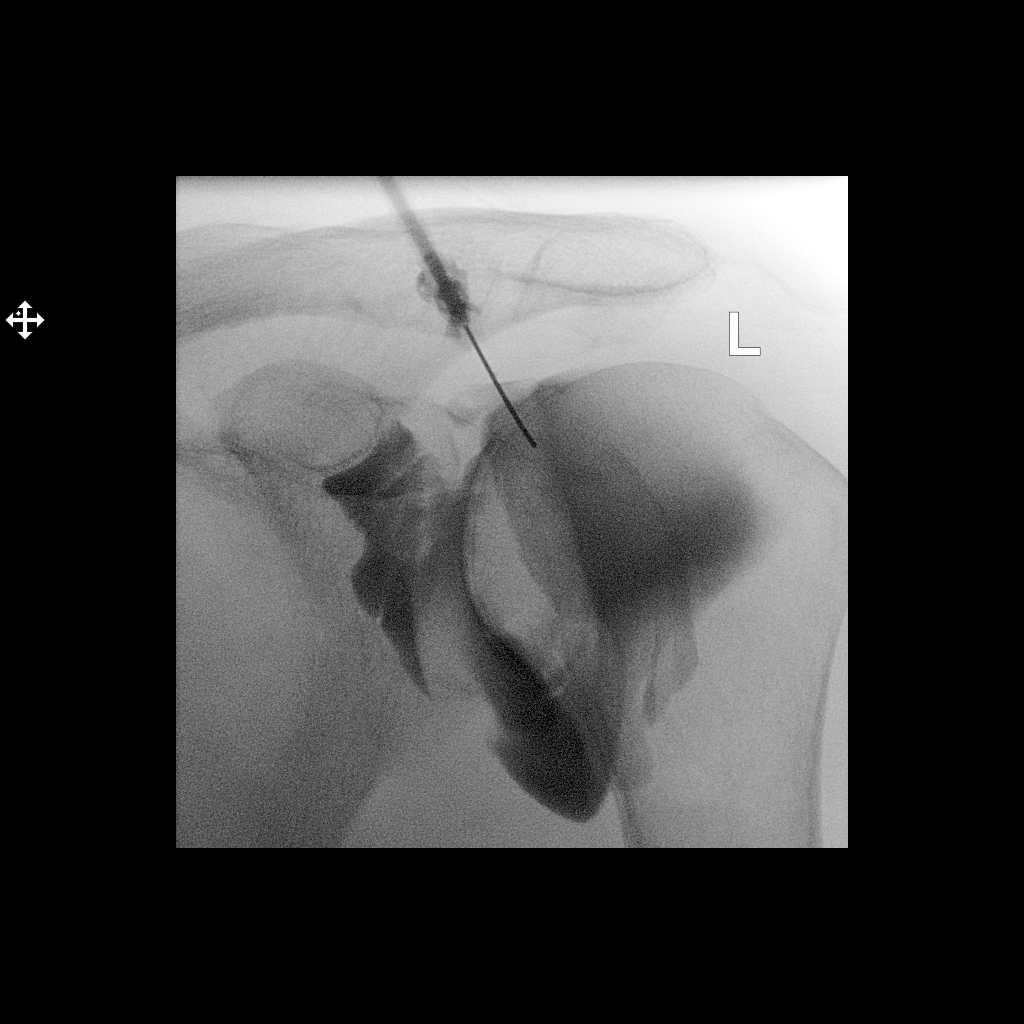

[2 of 2 positions shown; findings below may reference images not displayed]

An appropriate skin entry site was determined under fluoroscopy.
Skin site was marked, prepped with Betadine, and draped in usual
sterile fashion, and infiltrated locally with 1% lidocaine.

22-gauge spinal needle advanced to the superior medial margin of the
humeral head. 1 mL of lidocaine 1% injected easily. 10ml of a
mixture of 20 mL dilute iodinated contrast with 0.1ml Multihance
contrast was injected into the shoulder joint. Intraarticular flow
was confirmed on fluoroscopy. Patient transferred to MRI.

Operator: Likarius Alpo NP

COMPLICATIONS:
COMPLICATIONS
none
IMPRESSION: 1. Technically successful left shoulder injection for MRI

## 2024-02-19 ENCOUNTER — Other Ambulatory Visit (HOSPITAL_BASED_OUTPATIENT_CLINIC_OR_DEPARTMENT_OTHER): Payer: Self-pay | Admitting: Obstetrics & Gynecology

## 2024-02-19 DIAGNOSIS — Z1231 Encounter for screening mammogram for malignant neoplasm of breast: Secondary | ICD-10-CM

## 2024-02-23 ENCOUNTER — Ambulatory Visit (HOSPITAL_BASED_OUTPATIENT_CLINIC_OR_DEPARTMENT_OTHER)
Admission: RE | Admit: 2024-02-23 | Discharge: 2024-02-23 | Disposition: A | Discharge status: Home / Self Care | Source: Ambulatory Visit

## 2024-02-23 ENCOUNTER — Encounter (HOSPITAL_BASED_OUTPATIENT_CLINIC_OR_DEPARTMENT_OTHER): Payer: Self-pay | Admitting: Radiology

## 2024-02-23 DIAGNOSIS — Z1231 Encounter for screening mammogram for malignant neoplasm of breast: Secondary | ICD-10-CM | POA: Insufficient documentation

## 2024-06-06 ENCOUNTER — Encounter: Payer: Self-pay | Admitting: Radiology
